# Patient Record
Sex: Female | Born: 1950 | Race: White | Hispanic: No | Marital: Married | Smoking: Current every day smoker
Health system: Southern US, Community
[De-identification: ages and names within clinical notes are randomized; demographics above are authoritative.]

## PROBLEM LIST (undated history)

## (undated) DIAGNOSIS — E785 Hyperlipidemia, unspecified: Secondary | ICD-10-CM

## (undated) DIAGNOSIS — J449 Chronic obstructive pulmonary disease, unspecified: Secondary | ICD-10-CM

## (undated) DIAGNOSIS — K219 Gastro-esophageal reflux disease without esophagitis: Secondary | ICD-10-CM

## (undated) DIAGNOSIS — R319 Hematuria, unspecified: Secondary | ICD-10-CM

## (undated) DIAGNOSIS — I10 Essential (primary) hypertension: Secondary | ICD-10-CM

## (undated) DIAGNOSIS — E559 Vitamin D deficiency, unspecified: Secondary | ICD-10-CM

## (undated) DIAGNOSIS — K8020 Calculus of gallbladder without cholecystitis without obstruction: Secondary | ICD-10-CM

## (undated) DIAGNOSIS — J329 Chronic sinusitis, unspecified: Secondary | ICD-10-CM

## (undated) DIAGNOSIS — F419 Anxiety disorder, unspecified: Secondary | ICD-10-CM

## (undated) DIAGNOSIS — IMO0001 Reserved for inherently not codable concepts without codable children: Secondary | ICD-10-CM

## (undated) DIAGNOSIS — N941 Unspecified dyspareunia: Secondary | ICD-10-CM

## (undated) HISTORY — DX: Hematuria, unspecified: R31.9

## (undated) HISTORY — DX: Anxiety disorder, unspecified: F41.9

## (undated) HISTORY — DX: Calculus of gallbladder without cholecystitis without obstruction: K80.20

## (undated) HISTORY — DX: Unspecified dyspareunia: N94.10

## (undated) HISTORY — DX: Chronic sinusitis, unspecified: J32.9

## (undated) HISTORY — DX: Hyperlipidemia, unspecified: E78.5

## (undated) HISTORY — DX: Reserved for inherently not codable concepts without codable children: IMO0001

## (undated) HISTORY — DX: Vitamin D deficiency, unspecified: E55.9

## (undated) HISTORY — DX: Gastro-esophageal reflux disease without esophagitis: K21.9

## (undated) HISTORY — DX: Essential (primary) hypertension: I10

---

## 1987-01-31 HISTORY — PX: OOPHORECTOMY: SHX86

## 1987-01-31 HISTORY — PX: APPENDECTOMY: SHX54

## 2005-04-27 ENCOUNTER — Ambulatory Visit: Payer: Self-pay

## 2007-07-31 ENCOUNTER — Ambulatory Visit: Payer: Self-pay | Admitting: Internal Medicine

## 2008-10-16 ENCOUNTER — Ambulatory Visit: Payer: Self-pay | Admitting: Internal Medicine

## 2008-12-23 ENCOUNTER — Ambulatory Visit: Payer: Self-pay | Admitting: Family Medicine

## 2009-10-06 ENCOUNTER — Ambulatory Visit: Payer: Self-pay | Admitting: Internal Medicine

## 2009-10-19 ENCOUNTER — Ambulatory Visit: Payer: Self-pay | Admitting: Internal Medicine

## 2010-11-08 ENCOUNTER — Ambulatory Visit: Payer: Self-pay

## 2010-11-28 ENCOUNTER — Ambulatory Visit: Payer: Self-pay | Admitting: Urology

## 2010-12-26 ENCOUNTER — Ambulatory Visit: Payer: Self-pay | Admitting: Gastroenterology

## 2010-12-27 LAB — PATHOLOGY REPORT

## 2012-04-11 ENCOUNTER — Ambulatory Visit: Payer: Self-pay

## 2013-12-23 ENCOUNTER — Ambulatory Visit: Payer: Self-pay

## 2014-01-30 HISTORY — PX: COLONOSCOPY: SHX174

## 2014-04-03 ENCOUNTER — Ambulatory Visit: Payer: Self-pay | Admitting: General Practice

## 2014-04-14 ENCOUNTER — Ambulatory Visit: Payer: Self-pay | Admitting: General Practice

## 2014-07-28 ENCOUNTER — Ambulatory Visit (INDEPENDENT_AMBULATORY_CARE_PROVIDER_SITE_OTHER): Payer: PRIVATE HEALTH INSURANCE | Admitting: Urology

## 2014-07-28 VITALS — BP 133/80 | HR 90 | Resp 18 | Ht 65.0 in | Wt 122.0 lb

## 2014-07-28 DIAGNOSIS — R312 Other microscopic hematuria: Secondary | ICD-10-CM

## 2014-07-28 DIAGNOSIS — R3129 Other microscopic hematuria: Secondary | ICD-10-CM | POA: Insufficient documentation

## 2014-07-28 NOTE — Progress Notes (Signed)
07/28/2014 3:35 PM   Shari Gutierrez [birth year] [id number]  Referring provider: Lavera Guise, MD [address] [place], [place] [address]  Chief Complaint  Patient presents with  . Hematuria    HPI:  1 - Microscopic Hematuria - Pt with blood on UA x several noted by PCP. No gross episodes. 30PY smoker, still smokes. Had cysto years ago for unrelated reasons that was normal per report.   PMH sig for appy, benign ovarian cyst removal. NO CV disease. No blood thinners.  Today "Shari Gutierrez" is seen as new patient for above. UA today confirms persistent microhematuria.    PMH: Past Medical History  Diagnosis Date  . Reflux   . Anxiety   . Hyperlipidemia   . Hypertension   . Sinusitis     Surgical History: Past Surgical History  Procedure Laterality Date  . Oophorectomy  1989  . Appendectomy  1989    Home Medications:    Medication List       This list is accurate as of: 07/28/14  3:35 PM.  Always use your most recent med list.               ALPRAZolam 0.25 MG tablet  Commonly known as:  XANAX  Take 0.25 mg by mouth at bedtime as needed for anxiety.     cholecalciferol 1000 UNITS tablet  Commonly known as:  VITAMIN D  Take 1,000 Units by mouth 2 (two) times daily.     fenofibrate 160 MG tablet  Take 160 mg by mouth daily.     fluconazole 150 MG tablet  Commonly known as:  DIFLUCAN  TAKE 1 TABLET BY MOUTH EVERY DAY MAY REPEAT IN 3 DAYS     metoprolol tartrate 25 MG tablet  Commonly known as:  LOPRESSOR  Take 25 mg by mouth 2 (two) times daily.     omeprazole 20 MG capsule  Commonly known as:  PRILOSEC  TAKE 1 CAPSULE ONCE DAILY FOR REFLUX     ranitidine 150 MG tablet  Commonly known as:  ZANTAC  Take 150 mg by mouth as needed for heartburn.        Allergies: No Known Allergies  Family History: Family History  Problem Relation Age of Onset  . Kidney failure Mother   . Cancer Father     Social History:  reports that she has been smoking  Cigarettes.  She started smoking about 35 years ago. She has been smoking about 1.00 pack per day. She does not have any smokeless tobacco history on file. She reports that she does not drink alcohol. Her drug history is not on file.    Review of Systems  Gastrointestinal (upper)  : Negative for upper GI symptoms  Gastrointestinal (lower) : Negative for lower GI symptoms  Constitutional : Negative for symptoms  Skin: Negative for skin symptoms  Eyes: Negative for eye symptoms  Ear/Nose/Throat : Negative for Ear/Nose/Throat symptoms  Hematologic/Lymphatic: Negative for Hematologic/Lymphatic symptoms  Cardiovascular : Negative for cardiovascular symptoms  Respiratory : Negative for respiratory symptoms  Endocrine: Negative for endocrine symptoms  Musculoskeletal: Negative for musculoskeletal symptoms  Neurological: Negative for neurological symptoms  Psychologic: Negative for psychiatric symptoms   Physical Exam: BP 133/80 mmHg  Pulse 90  Resp 18  Ht 5\' 5"  (1.651 m)  Wt 122 lb (55.339 kg)  BMI 20.30 kg/m2  Constitutional:  Alert and oriented, No acute distress. HEENT: [place] AT, moist mucus membranes.  Trachea midline, no masses. Cardiovascular: No clubbing, cyanosis,  or edema. Respiratory: Normal respiratory effort, no increased work of breathing. GI: Abdomen is soft, nontender, nondistended, no abdominal masses GU: No CVA tenderness. Remote lower rt abdominal scar w/o hernias.  Skin: No rashes, bruises or suspicious lesions. Lymph: No cervical or inguinal adenopathy. Neurologic: Grossly intact, no focal deficits, moving all 4 extremities. Psychiatric: Normal mood and affect.  Laboratory Data: No results found for: WBC, HGB, HCT, MCV, PLT  No results found for: CREATININE  No results found for: PSA  No results found for: TESTOSTERONE  No results found for: HGBA1C  Urinalysis No results found for: COLORURINE, APPEARANCEUR, LABSPEC, PHURINE,  GLUCOSEU, HGBUR, BILIRUBINUR, KETONESUR, PROTEINUR, UROBILINOGEN, NITRITE, LEUKOCYTESUR   Assessment & Plan:   1. Microscopic Hematuria - certainly warrants complete eval in long-term smoker with minimal comorbidity. BMP today. CT and cysto on return. Goals of evaluation discussed in detail.   2 - RTC 2-3 weeks for cysto    Alexis Frock, [place] [address] [place], [place] [address] [phone]

## 2014-07-29 LAB — MICROSCOPIC EXAMINATION: Bacteria, UA: NONE SEEN

## 2014-07-29 LAB — URINALYSIS, COMPLETE
BILIRUBIN UA: NEGATIVE
Glucose, UA: NEGATIVE
Ketones, UA: NEGATIVE
Leukocytes, UA: NEGATIVE
Nitrite, UA: NEGATIVE
PH UA: 5.5 (ref 5.0–7.5)
Protein, UA: NEGATIVE
Urobilinogen, Ur: 0.2 mg/dL (ref 0.2–1.0)

## 2014-07-29 LAB — BASIC METABOLIC PANEL
BUN/Creatinine Ratio: 13 (ref 11–26)
BUN: 12 mg/dL (ref 8–27)
CALCIUM: 10 mg/dL (ref 8.7–10.3)
CHLORIDE: 101 mmol/L (ref 97–108)
CO2: 23 mmol/L (ref 18–29)
Creatinine, Ser: 0.92 mg/dL (ref 0.57–1.00)
GFR calc Af Amer: 77 mL/min/{1.73_m2} (ref >59)
GFR calc non Af Amer: 66 mL/min/{1.73_m2} (ref >59)
Glucose: 94 mg/dL (ref 65–99)
Potassium: 4.2 mmol/L (ref 3.5–5.2)
Sodium: 138 mmol/L (ref 134–144)

## 2014-08-07 ENCOUNTER — Ambulatory Visit
Admission: RE | Admit: 2014-08-07 | Discharge: 2014-08-07 | Disposition: A | Discharge status: Home / Self Care | Payer: PRIVATE HEALTH INSURANCE | Source: Ambulatory Visit | Attending: Urology | Admitting: Urology

## 2014-08-07 DIAGNOSIS — I700 Atherosclerosis of aorta: Secondary | ICD-10-CM | POA: Insufficient documentation

## 2014-08-07 DIAGNOSIS — I77811 Abdominal aortic ectasia: Secondary | ICD-10-CM | POA: Diagnosis not present

## 2014-08-07 DIAGNOSIS — R312 Other microscopic hematuria: Secondary | ICD-10-CM | POA: Insufficient documentation

## 2014-08-07 DIAGNOSIS — Z87440 Personal history of urinary (tract) infections: Secondary | ICD-10-CM | POA: Diagnosis not present

## 2014-08-07 DIAGNOSIS — K8020 Calculus of gallbladder without cholecystitis without obstruction: Secondary | ICD-10-CM | POA: Insufficient documentation

## 2014-08-07 DIAGNOSIS — N8331 Acquired atrophy of ovary: Secondary | ICD-10-CM | POA: Diagnosis not present

## 2014-08-07 DIAGNOSIS — N289 Disorder of kidney and ureter, unspecified: Secondary | ICD-10-CM | POA: Insufficient documentation

## 2014-08-07 DIAGNOSIS — N858 Other specified noninflammatory disorders of uterus: Secondary | ICD-10-CM | POA: Insufficient documentation

## 2014-08-07 DIAGNOSIS — F17200 Nicotine dependence, unspecified, uncomplicated: Secondary | ICD-10-CM | POA: Diagnosis not present

## 2014-08-07 DIAGNOSIS — R3129 Other microscopic hematuria: Secondary | ICD-10-CM

## 2014-08-07 MED ORDER — IOHEXOL 300 MG/ML  SOLN
125.0000 mL | Freq: Once | INTRAMUSCULAR | Status: AC | PRN
  Administered 2014-08-07: 125 mL via INTRAVENOUS

## 2014-08-17 ENCOUNTER — Ambulatory Visit (INDEPENDENT_AMBULATORY_CARE_PROVIDER_SITE_OTHER): Payer: PRIVATE HEALTH INSURANCE | Admitting: Urology

## 2014-08-17 ENCOUNTER — Encounter: Payer: Self-pay | Admitting: Urology

## 2014-08-17 VITALS — BP 158/71 | HR 92 | Ht 65.0 in | Wt 121.1 lb

## 2014-08-17 DIAGNOSIS — R312 Other microscopic hematuria: Secondary | ICD-10-CM

## 2014-08-17 DIAGNOSIS — N952 Postmenopausal atrophic vaginitis: Secondary | ICD-10-CM

## 2014-08-17 DIAGNOSIS — R3129 Other microscopic hematuria: Secondary | ICD-10-CM

## 2014-08-17 LAB — URINALYSIS, COMPLETE
BILIRUBIN UA: NEGATIVE
Glucose, UA: NEGATIVE
KETONES UA: NEGATIVE
Nitrite, UA: NEGATIVE
PH UA: 7 (ref 5.0–7.5)
Protein, UA: NEGATIVE
SPEC GRAV UA: 1.015 (ref 1.005–1.030)
Urobilinogen, Ur: 0.2 mg/dL (ref 0.2–1.0)

## 2014-08-17 LAB — MICROSCOPIC EXAMINATION: BACTERIA UA: NONE SEEN

## 2014-08-17 MED ORDER — ESTROGENS, CONJUGATED 0.625 MG/GM VA CREA: 1.0000 g | TOPICAL_CREAM | VAGINAL | Status: DC

## 2014-08-17 MED ORDER — LIDOCAINE HCL 2 % EX GEL
1.0000 "application " | Freq: Once | CUTANEOUS | Status: AC
  Administered 2014-08-17: 1 via URETHRAL

## 2014-08-17 MED ORDER — CIPROFLOXACIN HCL 500 MG PO TABS
500.0000 mg | ORAL_TABLET | Freq: Once | ORAL | Status: AC
  Administered 2014-08-17: 500 mg via ORAL

## 2014-08-17 NOTE — Progress Notes (Signed)
3:43 PM   Shari Gutierrez 03/18/1950 [id number]  Referring provider: Lavera Guise, MD [address] [place], [place] [address]  Chief Complaint  Patient presents with  . Cysto    CTscan results    HPI: 64 yo F with history of microscopic hematuria who presents today for cystoscopy to complete work up.  CT Urogram negative for any GU pathology.  Pt with blood on UA x several noted by PCP. No gross episodes. 30PY smoker, still smokes. Had cysto years ago for unrelated reasons that was normal per report.  PMH sig for appy, benign ovarian cyst removal. NO CV disease. No blood thinners.  She does complain of significant vaginal dryness.   PMH: Past Medical History  Diagnosis Date  . Reflux   . Anxiety   . Hyperlipidemia   . Hypertension   . Sinusitis     Surgical History: Past Surgical History  Procedure Laterality Date  . Oophorectomy  1989  . Appendectomy  1989    Home Medications:    Medication List       This list is accurate as of: 08/17/14  3:43 PM.  Always use your most recent med list.               ALPRAZolam 0.25 MG tablet  Commonly known as:  XANAX  Take 0.25 mg by mouth at bedtime as needed for anxiety.     cholecalciferol 1000 UNITS tablet  Commonly known as:  VITAMIN D  Take 1,000 Units by mouth 2 (two) times daily.     fenofibrate 160 MG tablet  Take 160 mg by mouth daily.     metoprolol tartrate 25 MG tablet  Commonly known as:  LOPRESSOR  Take 25 mg by mouth 2 (two) times daily.     omeprazole 20 MG capsule  Commonly known as:  PRILOSEC  TAKE 1 CAPSULE ONCE DAILY FOR REFLUX        Allergies: No Known Allergies  Family History: Family History  Problem Relation Age of Onset  . Kidney failure Mother   . Cancer Father     Social History:  reports that she has been smoking Cigarettes.  She started smoking about 35 years ago. She has been smoking about 1.00 pack per day. She does not have any smokeless tobacco history on  file. She reports that she does not drink alcohol. Her drug history is not on file.   Physical Exam: BP 158/71 mmHg  Pulse 92  Ht 5\' 5"  (1.651 m)  Wt 121 lb 1.6 oz (54.931 kg)  BMI 20.15 kg/m2  Constitutional:  Alert and oriented, No acute distress. HEENT: [place] AT, moist mucus membranes.  Trachea midline, no masses. Cardiovascular: No clubbing, cyanosis, or edema. Respiratory: Normal respiratory effort, no increased work of breathing. GI: Abdomen is soft, nontender, nondistended, no abdominal masses GU: No CVA tenderness. Atrophic vaginitis noted. Normal external genitalia otherwise. Normal urethral meatus. Skin: No rashes, bruises or suspicious lesions. Neurologic: Grossly intact, no focal deficits, moving all 4 extremities. Psychiatric: Normal mood and affect.  Laboratory Data:  Lab Results  Component Value Date   CREATININE 0.92 07/28/2014    Urinalysis Results for orders placed or performed in visit on 08/17/14  Microscopic Examination  Result Value Ref Range   WBC, UA 0-5 0 -  5 /hpf   RBC, UA 3-10 (A) 0 -  2 /hpf   Epithelial Cells (non renal) 0-10 0 - 10 /hpf   Bacteria, UA None  seen None seen/Few  Urinalysis, Complete  Result Value Ref Range   Specific Gravity, UA 1.015 1.005 - 1.030   pH, UA 7.0 5.0 - 7.5   Color, UA Yellow Yellow   Appearance Ur Clear Clear   Leukocytes, UA 1+ (A) Negative   Protein, UA Negative Negative/Trace   Glucose, UA Negative Negative   Ketones, UA Negative Negative   RBC, UA Trace (A) Negative   Bilirubin, UA Negative Negative   Urobilinogen, Ur 0.2 0.2 - 1.0 mg/dL   Nitrite, UA Negative Negative   Microscopic Examination See below:     Imaging: CLINICAL DATA: 64 year old female with history of urinary tract infection 1 month ago. Recent urinalysis demonstrated microscopic hematuria. Currently asymptomatic.  EXAM: CT ABDOMEN AND PELVIS WITHOUT AND WITH CONTRAST  TECHNIQUE: Multidetector CT imaging of the abdomen and  pelvis was performed following the standard protocol before and following the bolus administration of intravenous contrast.  CONTRAST: 110mL OMNIPAQUE IOHEXOL 300 MG/ML SOLN  COMPARISON: No priors.  FINDINGS: Lower chest: Unremarkable.  Hepatobiliary: No cystic or solid hepatic lesions. No intra or extrahepatic biliary ductal dilatation. 7 mm calcified gallstone lying dependently in the gallbladder. No findings of an acute cholecystitis at this time.  Pancreas: No cystic or solid pancreatic lesions. No pancreatic ductal dilatation. No pancreatic or peripancreatic fluid or inflammatory changes.  Spleen: Unremarkable.  Adrenals/Urinary Tract: No calcifications are identified within the collecting system of either kidney, along the course of either ureter, or within the lumen of the urinary bladder. No hydroureteronephrosis or perinephric stranding to indicate urinary tract obstruction at this time. 2 sub cm low-attenuation lesions are noted in the right kidney, too small to characterize, but favored to represent tiny cysts. No aggressive appearing renal lesions are otherwise noted. Postcontrast delayed images demonstrate no definite filling defect within the collecting system of either kidney, along the well opacified portions of either ureter (portions of the mid to distal third of the right ureter and distal third of the left ureter are incompletely opacified) to strongly suggest the presence of a urothelial neoplasm at this time. Urinary bladder is normal in appearance. Bilateral adrenal glands are normal in appearance.  Stomach/Bowel: Normal appearance of the stomach. No pathologic dilatation of small bowel or colon.  Vascular/Lymphatic: Extensive atherosclerosis throughout the abdominal and pelvic vasculature, with mild ectasia throughout the abdominal aorta, but no frank aneurysm, and node dissection. No lymphadenopathy noted in the abdomen or  pelvis.  Reproductive: Uterus and ovaries are atrophic.  Other: No significant volume of ascites. No pneumoperitoneum.  Musculoskeletal: There are no aggressive appearing lytic or blastic lesions noted in the visualized portions of the skeleton.  IMPRESSION: 1. No explanation for the patient's history of micro hematuria. 2. Two sub cm low-attenuation lesions in the right kidney are too small to definitively characterize. Statistically, these are favored to represent tiny cysts. 3. Cholelithiasis without evidence of acute cholecystitis at this time. 4. Atherosclerosis.   Electronically Signed  By: Vinnie Langton M.D.  On: 08/07/2014 11:28    Cystoscopy Procedure Note  Patient identification was confirmed, informed consent was obtained, and patient was prepped using Betadine solution.  Lidocaine jelly was administered per urethral meatus.    Preoperative abx where received prior to procedure.    Procedure: - Flexible cystoscope introduced, without any difficulty.   - Thorough search of the bladder revealed:    normal urethral meatus    normal urothelium    no stones    no ulcers  no tumors    no urethral polyps    no trabeculation  - Ureteral orifices were normal in position and appearance.  Post-Procedure: - Patient tolerated the procedure well   Assessment & Plan: 64 year old smoker with microscopic hematuria status post negative workup including CT urogram and cystoscopy.  1. Microscopic hematuria As above.  Given her continued smoking, would recommend follow-up in 2 years for reassessment. - Urinalysis, Complete - ciprofloxacin (CIPRO) tablet 500 mg; Take 1 tablet (500 mg total) by mouth once. - lidocaine (XYLOCAINE) 2 % jelly 1 application; Place 1 application into the urethra once.  2. Atrophic vaginitis Atrophic vaginitis noted today, she is fairly symptomatic from this. We discussed risks and benefits as well as injections for use.  No  personal history of breast cancer. - conjugated estrogens (PREMARIN) vaginal cream; Place 0.5 Applicatorfuls vaginally every Monday, Wednesday, and Friday. Use small pea sized amount per urethral/ vaginia three times at bedtime per week on M-W-F.  Dispense: 42.5 g; Refill: [place], MD  [facility] [address] [place], [place] [address] [phone]

## 2014-11-17 ENCOUNTER — Encounter: Payer: Self-pay | Admitting: Physician Assistant

## 2014-11-17 ENCOUNTER — Ambulatory Visit: Payer: Self-pay | Admitting: Physician Assistant

## 2014-11-17 VITALS — BP 164/68 | Temp 98.4°F

## 2014-11-17 DIAGNOSIS — R300 Dysuria: Secondary | ICD-10-CM

## 2014-11-17 LAB — POCT URINALYSIS DIPSTICK
BILIRUBIN UA: NEGATIVE
Glucose, UA: NEGATIVE
Ketones, UA: NEGATIVE
LEUKOCYTES UA: NEGATIVE
NITRITE UA: NEGATIVE
Protein, UA: NEGATIVE
Spec Grav, UA: 1.015
UROBILINOGEN UA: 0.2
pH, UA: 7

## 2014-11-17 MED ORDER — NITROFURANTOIN MONOHYD MACRO 100 MG PO CAPS: 100.0000 mg | ORAL_CAPSULE | Freq: Two times a day (BID) | ORAL | Status: DC

## 2014-11-17 NOTE — Progress Notes (Signed)
S:  C/o uti sx for 4 days, burning, urgency, frequency, denies vaginal discharge, abdominal pain or flank pain:  Remainder ros neg, started taking macrobid 2 days ago  O:  Vitals wnl, nad, no cva tenderness, back nontender, lungs c t a,cv rrr, abd soft nontender, bs normal, n/v intact, ua 2+ blood  A: uti  P: macrobid 100mg  bid x 7d, increase water intake, add cranberry juice, return if not improving in 2 -3 days, return earlier if worsening, discussed pyelonephritis sx

## 2014-12-02 ENCOUNTER — Other Ambulatory Visit: Payer: Self-pay | Admitting: Nurse Practitioner

## 2014-12-02 DIAGNOSIS — Z1231 Encounter for screening mammogram for malignant neoplasm of breast: Secondary | ICD-10-CM

## 2014-12-07 ENCOUNTER — Ambulatory Visit: Payer: Self-pay | Admitting: Physician Assistant

## 2014-12-07 ENCOUNTER — Encounter: Payer: Self-pay | Admitting: Physician Assistant

## 2014-12-07 VITALS — BP 110/70 | HR 83 | Temp 98.4°F

## 2014-12-07 DIAGNOSIS — R09A2 Foreign body sensation, throat: Secondary | ICD-10-CM

## 2014-12-07 DIAGNOSIS — R0989 Other specified symptoms and signs involving the circulatory and respiratory systems: Secondary | ICD-10-CM

## 2014-12-07 NOTE — Progress Notes (Signed)
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

## 2014-12-28 ENCOUNTER — Ambulatory Visit: Payer: PRIVATE HEALTH INSURANCE

## 2014-12-31 ENCOUNTER — Ambulatory Visit: Payer: Self-pay | Admitting: Physician Assistant

## 2014-12-31 ENCOUNTER — Encounter: Payer: Self-pay | Admitting: Physician Assistant

## 2014-12-31 VITALS — BP 115/65 | HR 98 | Temp 98.0°F

## 2014-12-31 DIAGNOSIS — J018 Other acute sinusitis: Secondary | ICD-10-CM

## 2014-12-31 MED ORDER — AZITHROMYCIN 250 MG PO TABS: ORAL_TABLET | ORAL | Status: DC

## 2014-12-31 MED ORDER — PREDNISONE 10 MG PO TABS: 30.0000 mg | ORAL_TABLET | Freq: Every day | ORAL | Status: DC

## 2014-12-31 NOTE — Progress Notes (Signed)
S: C/o sinus pain for about 4 days, some fever, chills last 2 days but better now, some cough, denies  cp/sob, v/d, c/o of facial and dental pain.   Using otc meds:   O: PE: vitals wnl, nad, perrl eomi, normocephalic, tms dull, nasal mucosa red and swollen, throat injected, neck supple no lymph, lungs c t a, cv rrr, neuro intact  A:  Acute sinusitis   P: zpack, prednisone 30mg  qd x 3d;  drink fluids, continue regular meds , use otc meds of choice, return if not improving in 5 days, return earlier if worsening

## 2015-01-05 ENCOUNTER — Ambulatory Visit
Admission: RE | Admit: 2015-01-05 | Discharge: 2015-01-05 | Disposition: A | Discharge status: Home / Self Care | Payer: PRIVATE HEALTH INSURANCE | Source: Ambulatory Visit | Attending: Nurse Practitioner | Admitting: Nurse Practitioner

## 2015-01-05 DIAGNOSIS — Z1231 Encounter for screening mammogram for malignant neoplasm of breast: Secondary | ICD-10-CM | POA: Insufficient documentation

## 2015-01-11 ENCOUNTER — Encounter: Payer: Self-pay | Admitting: Physician Assistant

## 2015-01-11 ENCOUNTER — Ambulatory Visit: Payer: Self-pay | Admitting: Physician Assistant

## 2015-01-11 VITALS — BP 101/60 | HR 86 | Temp 97.9°F

## 2015-01-11 DIAGNOSIS — J018 Other acute sinusitis: Secondary | ICD-10-CM

## 2015-01-11 DIAGNOSIS — J069 Acute upper respiratory infection, unspecified: Secondary | ICD-10-CM

## 2015-01-11 MED ORDER — AZITHROMYCIN 250 MG PO TABS: ORAL_TABLET | ORAL | Status: DC

## 2015-01-11 NOTE — Progress Notes (Signed)
S: C/o runny nose and congestion for 3 days, no fever, chills, cp/sob, v/d; mucus was green this am but clear throughout the day, cough is sporadic, husband has same, got better when had zpack but sx have returned since he's been sick   O: PE: perrl eomi, normocephalic, tms dull, nasal mucosa red and swollen, throat injected, neck supple no lymph, lungs c t a, cv rrr, neuro intact, cough is congested  A:  Acute uri   P: zpack, drink fluids, continue regular meds , use otc meds of choice, return if not improving in 5 days, return earlier if worsening

## 2015-04-12 ENCOUNTER — Other Ambulatory Visit: Payer: Self-pay | Admitting: Neurology

## 2015-04-12 DIAGNOSIS — R42 Dizziness and giddiness: Secondary | ICD-10-CM

## 2015-04-29 ENCOUNTER — Encounter: Payer: Self-pay | Admitting: Physician Assistant

## 2015-04-29 ENCOUNTER — Ambulatory Visit: Payer: Self-pay | Admitting: Physician Assistant

## 2015-04-29 VITALS — BP 143/79 | HR 98 | Temp 98.3°F

## 2015-04-29 DIAGNOSIS — Z79899 Other long term (current) drug therapy: Secondary | ICD-10-CM

## 2015-04-29 NOTE — Progress Notes (Signed)
S: has questions about medication, switched from dulera to ventolin, wants to make sure that's ok, also having mri, ?if should take xanax prior to mri  O: vitals wnl, nad, neuro intact  A: medication review  P: ok to continue ventolin instead of dulera, pt does not have hx of asthma or known copd; take xanax prior to [facility]

## 2015-04-30 ENCOUNTER — Ambulatory Visit
Admission: RE | Admit: 2015-04-30 | Discharge: 2015-04-30 | Disposition: A | Discharge status: Home / Self Care | Payer: Managed Care, Other (non HMO) | Source: Ambulatory Visit | Attending: Neurology | Admitting: Neurology

## 2015-04-30 DIAGNOSIS — R42 Dizziness and giddiness: Secondary | ICD-10-CM | POA: Diagnosis present

## 2015-04-30 DIAGNOSIS — H8110 Benign paroxysmal vertigo, unspecified ear: Secondary | ICD-10-CM | POA: Diagnosis present

## 2015-04-30 LAB — POCT I-STAT CREATININE: Creatinine, Ser: 1 mg/dL (ref 0.44–1.00)

## 2015-04-30 MED ORDER — GADOBENATE DIMEGLUMINE 529 MG/ML IV SOLN
15.0000 mL | Freq: Once | INTRAVENOUS | Status: AC | PRN
  Administered 2015-04-30: 11 mL via INTRAVENOUS

## 2015-05-10 ENCOUNTER — Encounter: Payer: Self-pay | Admitting: Physician Assistant

## 2015-05-10 ENCOUNTER — Ambulatory Visit: Payer: Self-pay | Admitting: Physician Assistant

## 2015-05-10 VITALS — BP 120/70 | HR 84 | Temp 97.7°F

## 2015-05-10 DIAGNOSIS — Z299 Encounter for prophylactic measures, unspecified: Secondary | ICD-10-CM

## 2015-05-10 NOTE — Progress Notes (Signed)
S: pt here because wants to know results of mri , hasn't heard from her doctor  O: vitals wnl, nad, neuro intact  A: lab review  P: mri reviewed, results normal

## 2015-06-15 ENCOUNTER — Ambulatory Visit: Payer: Self-pay | Admitting: Physician Assistant

## 2015-06-15 ENCOUNTER — Encounter: Payer: Self-pay | Admitting: Physician Assistant

## 2015-06-15 VITALS — BP 119/70 | HR 88 | Temp 97.8°F

## 2015-06-15 DIAGNOSIS — N390 Urinary tract infection, site not specified: Secondary | ICD-10-CM

## 2015-06-15 MED ORDER — NITROFURANTOIN MONOHYD MACRO 100 MG PO CAPS: 100.0000 mg | ORAL_CAPSULE | Freq: Two times a day (BID) | ORAL | Status: DC

## 2015-06-15 NOTE — Progress Notes (Signed)
S:  C/o uti sx for 2 days, bladder pressure, back pain,  urgency, frequency, denies vaginal discharge, abdominal pain or flank pain:  Remainder ros neg  O:  Vitals wnl, nad, no cva tenderness, back nontender, lungs c t a,cv rrr, abd soft nontender, bs normal, n/v intact, ua trace leuks  A: uti  P: macrobid 100mg  bid x 7d, increase water intake, add cranberry juice, return if not improving in 2 -3 days, return earlier if worsening, discussed pyelonephritis sx

## 2015-06-23 ENCOUNTER — Encounter: Payer: Self-pay | Admitting: Physician Assistant

## 2015-06-23 ENCOUNTER — Ambulatory Visit: Payer: Self-pay | Admitting: Physician Assistant

## 2015-06-23 VITALS — BP 110/70 | HR 92 | Temp 98.5°F

## 2015-06-23 DIAGNOSIS — N390 Urinary tract infection, site not specified: Secondary | ICD-10-CM

## 2015-06-23 LAB — POCT URINALYSIS DIPSTICK
BILIRUBIN UA: NEGATIVE
Glucose, UA: NEGATIVE
KETONES UA: NEGATIVE
Leukocytes, UA: NEGATIVE
Nitrite, UA: NEGATIVE
PROTEIN UA: NEGATIVE
SPEC GRAV UA: 1.01
Urobilinogen, UA: 0.2
pH, UA: 5.5

## 2015-06-23 MED ORDER — NITROFURANTOIN MONOHYD MACRO 100 MG PO CAPS: 100.0000 mg | ORAL_CAPSULE | Freq: Two times a day (BID) | ORAL | Status: DC

## 2015-06-23 NOTE — Addendum Note (Signed)
Addended by: Rudene Anda T on: 06/23/2015 09:49 AM   Modules accepted: Orders

## 2015-06-23 NOTE — Progress Notes (Signed)
S:  C/o uti sx for 8-9 days, burning, urgency, frequency, denies vaginal discharge, abdominal pain or flank pain:  Remainder ros neg, finished antibiotic but stills feel pressure like the infection isn't completely gone, is going out of town and worried it will get worse  O:  Vitals wnl, nad, neuro intact, ua trace blood  A: dysuria  P: rx for macrobid sent to pharmacy, will culture urine, pt will not use antibiotic unless culture is + or sx worsen;  increase water intake, add cranberry juice, return if not improving in 2 -3 days, return earlier if worsening, discussed pyelonephritis sx

## 2015-06-25 LAB — URINE CULTURE: Organism ID, Bacteria: NO GROWTH

## 2015-06-30 ENCOUNTER — Encounter: Payer: Self-pay | Admitting: Obstetrics and Gynecology

## 2015-07-08 ENCOUNTER — Encounter: Payer: Self-pay | Admitting: Obstetrics and Gynecology

## 2015-07-08 ENCOUNTER — Ambulatory Visit (INDEPENDENT_AMBULATORY_CARE_PROVIDER_SITE_OTHER): Payer: Managed Care, Other (non HMO) | Admitting: Obstetrics and Gynecology

## 2015-07-08 VITALS — BP 115/64 | HR 86 | Ht 65.0 in | Wt 125.6 lb

## 2015-07-08 DIAGNOSIS — Z0000 Encounter for general adult medical examination without abnormal findings: Secondary | ICD-10-CM

## 2015-07-08 DIAGNOSIS — Z780 Asymptomatic menopausal state: Secondary | ICD-10-CM | POA: Diagnosis not present

## 2015-07-08 DIAGNOSIS — Z1211 Encounter for screening for malignant neoplasm of colon: Secondary | ICD-10-CM | POA: Diagnosis not present

## 2015-07-08 DIAGNOSIS — N952 Postmenopausal atrophic vaginitis: Secondary | ICD-10-CM

## 2015-07-08 DIAGNOSIS — Z01419 Encounter for gynecological examination (general) (routine) without abnormal findings: Secondary | ICD-10-CM

## 2015-07-08 DIAGNOSIS — N941 Unspecified dyspareunia: Secondary | ICD-10-CM

## 2015-07-08 NOTE — Patient Instructions (Signed)
1. Pap smear is done 2. Mammogram already done this year 3. Stool guaiac cards are given for colon cancer screening 4. Continue with healthy eating and exercise 5. Recommend calcium with vitamin D supplementation-1200 mg a day 6. Start trial of Estrace cream 1/2 g intravaginal twice a week 7. Return in 1 year

## 2015-07-08 NOTE — Progress Notes (Signed)
ANNUAL PREVENTATIVE CARE GYN  ENCOUNTER NOTE  Subjective:       Shari Gutierrez is a 65 y.o. G11P1001 female here for a routine annual gynecologic exam.  Current complaints: 1.  Dyspareunia  In May 2016, patient underwent ENDOSEE procedure, and endometrial biopsy because of an abnormal endometrial fluid collection with was identified on ultrasound on 05/14/2014.  The ENDOSEE procedure demonstrated atrophic appearing endometrium.  Endometrial biopsy was benign.  Patient has not experienced any abnormal uterine bleeding.  Main concern today is pelvic discomfort with intercourse.  She does use lubricants, but has never used vaginal estrogen cream   Gynecologic History No LMP recorded. Patient is postmenopausal. Contraception: post menopausal statusMenstrual Last mammogram: 12/2014. Results were: normal  Obstetric History OB History  Gravida Para Term Preterm AB SAB TAB Ectopic Multiple Living  1 1 1       1     # Outcome Date GA Lbr Len/2nd Weight Sex Delivery Anes PTL Lv  1 Term 1978   6 lb (2.722 kg) F Vag-Spont   Y      Past Medical History  Diagnosis Date  . Reflux   . Anxiety   . Hyperlipidemia   . Hypertension   . Sinusitis   . 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 Vitamin D deficiency   . Hematuria   . Gall stones   . Kidney stones     Past Surgical History  Procedure Laterality Date  . Oophorectomy  1989  . Appendectomy  1989    Current Outpatient Prescriptions on File Prior to Visit  Medication Sig Dispense Refill  . cholecalciferol (VITAMIN D) 1000 UNITS tablet Take 1,000 Units by mouth 2 (two) times daily.    . fenofibrate 160 MG tablet Take 160 mg by mouth daily.    . metoprolol tartrate (LOPRESSOR) 25 MG tablet Take 25 mg by mouth 2 (two) times daily.     No current facility-administered medications on file prior to visit.    No Known Allergies  Social History   Social History  . Marital Status: Married    Spouse Name: N/A  . Number of Children: N/A  . Years  of Education: N/A   Occupational History  . Not on file.   Social History Main Topics  . Smoking status: Current Every Day Smoker -- 1.00 packs/day    Types: Cigarettes    Start date: 07/28/1979  . Smokeless tobacco: Not on file  . Alcohol Use: No  . Drug Use: No  . Sexual Activity: Yes    Birth Control/ Protection: Post-menopausal   Other Topics Concern  . Not on file   Social History Narrative    Family History  Problem Relation Age of Onset  . Kidney failure Mother   . Cancer Father   . Breast cancer Neg Hx   . Ovarian cancer Neg Hx   . Colon cancer Neg Hx   . Heart disease Neg Hx   . Diabetes Neg Hx     The following portions of the patient's history were reviewed and updated as appropriate: allergies, current medications, past family history, past medical history, past social history, past surgical history and problem list.  Review of Systems ROS Review of Systems - General ROS: negative for - chills, fatigue, fever, hot flashes, night sweats, weight gain or weight loss Psychological ROS: negative for - anxiety, decreased libido, depression, mood swings, physical abuse or sexual abuse Ophthalmic ROS: negative for - blurry vision, eye pain or loss of vision  ENT ROS: negative for - headaches, hearing change, visual changes or vocal changes Allergy and Immunology ROS: negative for - hives, itchy/watery eyes or seasonal allergies Hematological and Lymphatic ROS: negative for - bleeding problems, bruising, swollen lymph nodes or weight loss Endocrine ROS: negative for - galactorrhea, hair pattern changes, hot flashes, malaise/lethargy, mood swings, palpitations, polydipsia/polyuria, skin changes, temperature intolerance or unexpected weight changes Breast ROS: negative for - new or changing breast lumps or nipple discharge Respiratory ROS: negative for - cough or shortness of breath Cardiovascular ROS: negative for - chest pain, irregular heartbeat, palpitations or  shortness of breath Gastrointestinal ROS: no abdominal pain, change in bowel habits, or black or bloody stools Genito-Urinary ROS: no dysuria, trouble voiding, or hematuria.  POSITIVE-dyspareunia Musculoskeletal ROS: negative for - joint pain or joint stiffness Neurological ROS: negative for - bowel and bladder control changes Dermatological ROS: negative for rash and skin lesion changes   Objective:   BP 115/64 mmHg  Pulse 86  Ht 5\' 5"  (1.651 m)  Wt 125 lb 9.6 oz (56.972 kg)  BMI 20.90 kg/m2 CONSTITUTIONAL: Well-developed, well-nourished female in no acute distress.  PSYCHIATRIC: Normal mood and affect. Normal behavior. Normal judgment and thought content. [place]: Alert and oriented to person, place, and time. Normal muscle tone coordination. No cranial nerve deficit noted. HENT:  Normocephalic, atraumatic EYES: Conjunctivae and EOM are normal. No scleral icterus.  NECK: Normal range of motion, supple, no masses.  Normal thyroid.  SKIN: Skin is warm and dry. No rash noted. Not diaphoretic. No erythema. No pallor. CARDIOVASCULAR: Normal heart rate noted, regular rhythm, no murmur. RESPIRATORY: Clear to auscultation bilaterally. Effort and breath sounds normal, no problems with respiration noted. BREASTS: Symmetric in size. No masses, skin changes, nipple drainage, or lymphadenopathy. ABDOMEN: Soft, normal bowel sounds, no distention noted.  No tenderness, rebound or guarding.  BLADDER: Normal PELVIC:  External Genitalia: Normal  BUS: Normal  Vagina: severe atrophy  Cervix: Normal; no lesions; no cervical motion tenderness   Uterus: Normal; midplane, normal size, shape, mobile  Adnexa: Normal  RV: External Exam NormaI, No Rectal Masses and Normal Sphincter tone  MUSCULOSKELETAL: Normal range of motion. No tenderness.  No cyanosis, clubbing, or edema.  2+ distal pulses. LYMPHATIC: No Axillary, Supraclavicular, or Inguinal Adenopathy.    Assessment:   Annual gynecologic  examination 65 y.o. Contraception: post menopausal status Normal BMI Problem List Items Addressed This Visit    None    Visit Diagnoses    Screen for colon cancer    -  Primary    Relevant Orders    Fecal occult blood, imunochemical     vaginal atrophy, severe, symptomatic  Plan:  Pap: Pap Co Test Mammogram: Ordered Stool Guaiac Testing:  Ordered Labs: primary care Routine preventative health maintenance measures emphasized: Exercise/Diet/Weight control, Tobacco Warnings and Alcohol/Substance use risks Trial of Estrace cream 1/2 g intravaginal twice weekly Return to Clinic - [place], MD   Note: This dictation was prepared with Dragon dictation along with smaller phrase technology. Any transcriptional errors that result from this process are unintentional.

## 2015-07-12 LAB — PAP IG AND HPV HIGH-RISK
HPV, high-risk: NEGATIVE
PAP Smear Comment: 0

## 2015-07-21 ENCOUNTER — Other Ambulatory Visit: Payer: Self-pay

## 2015-07-21 MED ORDER — ESTRADIOL 0.1 MG/GM VA CREA
0.5000 | TOPICAL_CREAM | VAGINAL | Status: DC
Start: 2015-07-21 — End: 2016-07-24

## 2015-08-04 ENCOUNTER — Ambulatory Visit: Payer: Self-pay | Admitting: Physician Assistant

## 2015-08-04 ENCOUNTER — Encounter: Payer: Self-pay | Admitting: Physician Assistant

## 2015-08-04 VITALS — BP 110/70 | HR 79 | Temp 97.6°F

## 2015-08-04 DIAGNOSIS — J069 Acute upper respiratory infection, unspecified: Secondary | ICD-10-CM

## 2015-08-04 MED ORDER — LEVOFLOXACIN 500 MG PO TABS: 500.0000 mg | ORAL_TABLET | Freq: Every day | ORAL | Status: DC

## 2015-08-04 MED ORDER — FIRST-DUKES MOUTHWASH MT SUSP: 5.0000 mL | Freq: Three times a day (TID) | OROMUCOSAL | Status: DC

## 2015-08-04 NOTE — Progress Notes (Signed)
S: C/o runny nose and congestion for 3 days, sinus pressure, deep cough, chest congestion, no fever, chills, cp/sob, v/d; mucus was green this am but clear throughout the day, cough is sporadic,   Using otc meds: robitussin  O: PE: vitals wnl, nad, perrl eomi, normocephalic, tms dull, nasal mucosa red and swollen, throat injected, neck supple no lymph, lungs c t a, cv rrr, neuro intact, cough is wet and deep  A:  Acute  uri   P: levaquin 500mg  qd, dukes for yeast in mouth if needed; take otc probiotic, drink fluids, continue regular meds , use otc meds of choice, return if not improving in 5 days, return earlier if worsening

## 2015-10-06 DIAGNOSIS — J019 Acute sinusitis, unspecified: Secondary | ICD-10-CM | POA: Diagnosis not present

## 2015-10-06 DIAGNOSIS — J301 Allergic rhinitis due to pollen: Secondary | ICD-10-CM | POA: Diagnosis not present

## 2015-10-12 DIAGNOSIS — H40153 Residual stage of open-angle glaucoma, bilateral: Secondary | ICD-10-CM | POA: Diagnosis not present

## 2015-11-09 DIAGNOSIS — R42 Dizziness and giddiness: Secondary | ICD-10-CM | POA: Diagnosis not present

## 2015-11-09 DIAGNOSIS — H8111 Benign paroxysmal vertigo, right ear: Secondary | ICD-10-CM | POA: Diagnosis not present

## 2015-12-08 ENCOUNTER — Other Ambulatory Visit: Payer: Self-pay | Admitting: Nurse Practitioner

## 2015-12-08 DIAGNOSIS — J449 Chronic obstructive pulmonary disease, unspecified: Secondary | ICD-10-CM | POA: Diagnosis not present

## 2015-12-08 DIAGNOSIS — Z1231 Encounter for screening mammogram for malignant neoplasm of breast: Secondary | ICD-10-CM

## 2015-12-08 DIAGNOSIS — E782 Mixed hyperlipidemia: Secondary | ICD-10-CM | POA: Diagnosis not present

## 2015-12-08 DIAGNOSIS — Z0001 Encounter for general adult medical examination with abnormal findings: Secondary | ICD-10-CM | POA: Diagnosis not present

## 2015-12-08 DIAGNOSIS — F17210 Nicotine dependence, cigarettes, uncomplicated: Secondary | ICD-10-CM | POA: Diagnosis not present

## 2015-12-08 DIAGNOSIS — N390 Urinary tract infection, site not specified: Secondary | ICD-10-CM | POA: Diagnosis not present

## 2015-12-08 DIAGNOSIS — I10 Essential (primary) hypertension: Secondary | ICD-10-CM | POA: Diagnosis not present

## 2016-01-13 ENCOUNTER — Ambulatory Visit: Payer: Managed Care, Other (non HMO)

## 2016-02-04 DIAGNOSIS — H40153 Residual stage of open-angle glaucoma, bilateral: Secondary | ICD-10-CM | POA: Diagnosis not present

## 2016-02-16 ENCOUNTER — Ambulatory Visit: Payer: Managed Care, Other (non HMO)

## 2016-03-15 ENCOUNTER — Ambulatory Visit
Admission: RE | Admit: 2016-03-15 | Discharge: 2016-03-15 | Disposition: A | Discharge status: Home / Self Care | Payer: PPO | Source: Ambulatory Visit | Attending: Nurse Practitioner | Admitting: Nurse Practitioner

## 2016-03-15 DIAGNOSIS — Z1231 Encounter for screening mammogram for malignant neoplasm of breast: Secondary | ICD-10-CM | POA: Diagnosis not present

## 2016-06-07 DIAGNOSIS — H40153 Residual stage of open-angle glaucoma, bilateral: Secondary | ICD-10-CM | POA: Diagnosis not present

## 2016-07-11 ENCOUNTER — Encounter: Payer: Managed Care, Other (non HMO) | Admitting: Obstetrics and Gynecology

## 2016-07-18 ENCOUNTER — Encounter: Payer: Managed Care, Other (non HMO) | Admitting: Obstetrics and Gynecology

## 2016-07-24 NOTE — Progress Notes (Signed)
ANNUAL PREVENTATIVE CARE GYN  ENCOUNTER NOTE  Subjective:       Shari Gutierrez is a 66 y.o. G72P1001 female with 30+ pack year history is here for a routine annual gynecologic exam.  Current complaints: 1.  Dyspareunia; symptoms are much better with premarin  Patient states she is doing well, and having no new issues besides the occasional dryness and irritation. States she is only using  Estrace cream once a week, twice if she can remember. Vitamin D supplementation is also done "when she remembers". She is still smoking and doesn't intend on quitting. She has declined colonoscopy and low dose CT scan with her PCP for cancer screening. Her husband has recently retired so they are spending half their time at [employer] and playing with the grand kids.    Gynecologic History No LMP recorded. Patient is postmenopausal. Contraception: post menopausal status Pap smear-07/2015 neg/neg Last mammogram: 03/2016 birad 1. Results were: normal  Obstetric History OB History  Gravida Para Term Preterm AB Living  1 1 1     1   SAB TAB Ectopic Multiple Live Births          1    # Outcome Date GA Lbr Len/2nd Weight Sex Delivery Anes PTL Lv  1 Term 1978   6 lb (2.722 kg) F Vag-Spont   LIV     Past Medical History:  Diagnosis Date  . Anxiety   . 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 Gall stones   . Hematuria   . Hyperlipidemia   . Hypertension   . Kidney stones   . Reflux   . Sinusitis   . Vitamin D deficiency    Past Surgical History:  Procedure Laterality Date  . APPENDECTOMY  1989  . OOPHORECTOMY  1989   Current Outpatient Prescriptions on File Prior to Visit  Medication Sig Dispense Refill  . cholecalciferol (VITAMIN D) 1000 UNITS tablet Take 1,000 Units by mouth 2 (two) times daily.    . Diphenhyd-Hydrocort-Nystatin (FIRST-DUKES MOUTHWASH) SUSP Use as directed 5 mLs in the mouth or throat 3 (three) times daily. Swish gargle and spit 140 mL 2  . fenofibrate 160 MG tablet Take 160 mg  by mouth daily.    . metoprolol tartrate (LOPRESSOR) 25 MG tablet Take 25 mg by mouth 2 (two) times daily.     No current facility-administered medications on file prior to visit.    No Known Allergies  Social History   Social History  . Marital status: Married    Spouse name: N/A  . Number of children: N/A  . Years of education: N/A   Occupational History  . Not on file.   Social History Main Topics  . Smoking status: Current Every Day Smoker    Packs/day: 1.00    Types: Cigarettes    Start date: 07/28/1979  . Smokeless tobacco: Never Used  . Alcohol use No  . Drug use: No  . Sexual activity: Yes    Birth control/ protection: Post-menopausal   Other Topics Concern  . Not on file   Social History Narrative  . No narrative on file    Family History  Problem Relation Age of Onset  . Kidney failure Mother   . Cancer Father   . Breast cancer Neg Hx   . Ovarian cancer Neg Hx   . Colon cancer Neg Hx   . Heart disease Neg Hx   . Diabetes Neg Hx    The following portions of the  patient's history were reviewed and updated as appropriate: allergies, current medications, past family history, past medical history, past social history, past surgical history and problem list.  Review of Systems Review of Systems  Constitutional: Negative for chills, diaphoresis, fever, malaise/fatigue and weight loss.  Eyes: Negative for blurred vision and double vision.  Respiratory: Negative for cough, shortness of breath and wheezing.   Cardiovascular: Negative for chest pain, palpitations and leg swelling.  Genitourinary: Negative for dysuria, frequency and urgency.  Skin: Negative for itching and rash.  Neurological: Negative for weakness and headaches.  All other systems reviewed and are negative.  Objective:  BP 114/67   Pulse 83   Ht 5\' 5"  (1.651 m)   Wt 131 lb 11.2 oz (59.7 kg)   BMI 21.92 kg/m  CONSTITUTIONAL: Well-developed, well-nourished female in no acute distress.   PSYCHIATRIC: Normal mood and affect. Normal behavior. Normal judgment and thought content. [place]: Alert and oriented to person, place, and time. Normal muscle tone coordination. No cranial nerve deficit noted. HENT:  Normocephalic, atraumatic EYES: Conjunctivae and EOM are normal. No scleral icterus.  NECK: Normal range of motion, supple, no masses.  Normal thyroid.  SKIN: Skin is warm and dry. No rash noted. Not diaphoretic. No erythema. No pallor. CARDIOVASCULAR: Normal heart rate noted, regular rhythm, no murmur. RESPIRATORY: Clear to auscultation bilaterally. Effort and breath sounds normal, no problems with respiration noted. BREASTS: Symmetric in size. No masses, skin changes, nipple drainage, or lymphadenopathy. ABDOMEN: Soft, normal bowel sounds, no distention noted.  No tenderness, rebound or guarding.  BLADDER: Normal PELVIC:  External Genitalia: Normal  BUS: Normal  Vagina: moderate to severe atrophy  Cervix: Normal; no lesions; no cervical motion tenderness   Uterus: Normal; midplane, normal size, shape, mobile  Adnexa: Normal  RV: External Exam NormaI, No Rectal Masses and Normal Sphincter tone  LYMPHATIC: No Axillary, Supraclavicular, or Inguinal Adenopathy.    Assessment:   Annual gynecologic examination 66 y.o. Contraception: post menopausal status Normal BMI Problem List Items Addressed This Visit    None    Visit Diagnoses    Vaginal atrophy    -  Primary   Well woman exam       Screen for colon cancer       Relevant Orders   Fecal occult blood, imunochemical   Menopause       Female dyspareunia        vaginal atrophy, moderate to severe, symptomatic  Plan:  1. Pap: not needed 2. Mammogram: February 2019 3. Stool Guaiac Testing:  Ordered 4. Labs: primary care 5.Routine preventative health maintenance measures emphasized: Exercise/Diet/Weight control, Tobacco Warnings and Alcohol/Substance use risks 6. Trial of Estrace cream 1/2 g intravaginal  twice weekly, can increase to three times if needed. 7. Continue Vitamin D Supplementation 8. Return to [place], CMA [place], Student-PA [facility]  Brayton Mars, MD   I have seen, interviewed, and examined the patient in conjunction with the [employer].A. student and affirm the diagnosis and management plan. Tianni Escamilla A. Curry Seefeldt, MD, FACOG    Note: This dictation was prepared with Dragon dictation along with smaller phrase technology. Any transcriptional errors that result from this process are unintentional.

## 2016-07-26 ENCOUNTER — Ambulatory Visit (INDEPENDENT_AMBULATORY_CARE_PROVIDER_SITE_OTHER): Payer: PPO | Admitting: Obstetrics and Gynecology

## 2016-07-26 ENCOUNTER — Encounter: Payer: Self-pay | Admitting: Obstetrics and Gynecology

## 2016-07-26 VITALS — BP 114/67 | HR 83 | Ht 65.0 in | Wt 131.7 lb

## 2016-07-26 DIAGNOSIS — Z780 Asymptomatic menopausal state: Secondary | ICD-10-CM | POA: Diagnosis not present

## 2016-07-26 DIAGNOSIS — N941 Unspecified dyspareunia: Secondary | ICD-10-CM | POA: Diagnosis not present

## 2016-07-26 DIAGNOSIS — Z1211 Encounter for screening for malignant neoplasm of colon: Secondary | ICD-10-CM

## 2016-07-26 DIAGNOSIS — N952 Postmenopausal atrophic vaginitis: Secondary | ICD-10-CM

## 2016-07-26 DIAGNOSIS — Z01419 Encounter for gynecological examination (general) (routine) without abnormal findings: Secondary | ICD-10-CM | POA: Diagnosis not present

## 2016-07-26 MED ORDER — ESTRADIOL 0.1 MG/GM VA CREA: 0.5000 | TOPICAL_CREAM | VAGINAL | 12 refills | Status: DC

## 2016-07-26 NOTE — Patient Instructions (Signed)
1. No Pap smear needed 2. Mammogram already done 3. Stool guaiac cards are given for colon cancer screening 4. Screening labs are done through primary care 5. Continue with healthy eating and exercise 6. Strongly encouraged calcium with vitamin D supplementation daily 7. CT scan of chest encouraged for lung cancer screening due togreater than 30-pack-year history of smoking 8. Premarin cream intravaginal twice weekly is prescribed and to be continued 9. Smoking cessation strongly encouraged 10. Return in 1 year  6 Health Maintenance for Postmenopausal Women Menopause is a normal process in which your reproductive ability comes to an end. This process happens gradually over a span of months to years, usually between the ages of 41 and 47. Menopause is complete when you have missed 12 consecutive menstrual periods. It is important to talk with your health care provider about some of the most common conditions that affect postmenopausal women, such as heart disease, cancer, and bone loss (osteoporosis). Adopting a healthy lifestyle and getting preventive care can help to promote your health and wellness. Those actions can also lower your chances of developing some of these common conditions. What should I know about menopause? During menopause, you may experience a number of symptoms, such as:  Moderate-to-severe hot flashes.  Night sweats.  Decrease in sex drive.  Mood swings.  Headaches.  Tiredness.  Irritability.  Memory problems.  Insomnia.  Choosing to treat or not to treat menopausal changes is an individual decision that you make with your health care provider. What should I know about hormone replacement therapy and supplements? Hormone therapy products are effective for treating symptoms that are associated with menopause, such as hot flashes and night sweats. Hormone replacement carries certain risks, especially as you become older. If you are thinking about using  estrogen or estrogen with progestin treatments, discuss the benefits and risks with your health care provider. What should I know about heart disease and stroke? Heart disease, heart attack, and stroke become more likely as you age. This may be due, in part, to the hormonal changes that your body experiences during menopause. These can affect how your body processes dietary fats, triglycerides, and cholesterol. Heart attack and stroke are both medical emergencies. There are many things that you can do to help prevent heart disease and stroke:  Have your blood pressure checked at least every 1-2 years. High blood pressure causes heart disease and increases the risk of stroke.  If you are 40-7 years old, ask your health care provider if you should take aspirin to prevent a heart attack or a stroke.  Do not use any tobacco products, including cigarettes, chewing tobacco, or electronic cigarettes. If you need help quitting, ask your health care provider.  It is important to eat a healthy diet and maintain a healthy weight. ? Be sure to include plenty of vegetables, fruits, low-fat dairy products, and lean protein. ? Avoid eating foods that are high in solid fats, added sugars, or salt (sodium).  Get regular exercise. This is one of the most important things that you can do for your health. ? Try to exercise for at least 150 minutes each week. The type of exercise that you do should increase your heart rate and make you sweat. This is known as moderate-intensity exercise. ? Try to do strengthening exercises at least twice each week. Do these in addition to the moderate-intensity exercise.  Know your numbers.Ask your health care provider to check your cholesterol and your blood glucose. Continue to have your  blood tested as directed by your health care provider.  What should I know about cancer screening? There are several types of cancer. Take the following steps to reduce your risk and to catch  any cancer development as early as possible. Breast Cancer  Practice breast self-awareness. ? This means understanding how your breasts normally appear and feel. ? It also means doing regular breast self-exams. Let your health care provider know about any changes, no matter how small.  If you are 40 or older, have a clinician do a breast exam (clinical breast exam or CBE) every year. Depending on your age, family history, and medical history, it may be recommended that you also have a yearly breast X-ray (mammogram).  If you have a family history of breast cancer, talk with your health care provider about genetic screening.  If you are at high risk for breast cancer, talk with your health care provider about having an MRI and a mammogram every year.  Breast cancer (BRCA) gene test is recommended for women who have family members with BRCA-related cancers. Results of the assessment will determine the need for genetic counseling and BRCA1 and for BRCA2 testing. BRCA-related cancers include these types: ? Breast. This occurs in males or females. ? Ovarian. ? Tubal. This may also be called fallopian tube cancer. ? Cancer of the abdominal or pelvic lining (peritoneal cancer). ? Prostate. ? Pancreatic.  Cervical, Uterine, and Ovarian Cancer Your health care provider may recommend that you be screened regularly for cancer of the pelvic organs. These include your ovaries, uterus, and vagina. This screening involves a pelvic exam, which includes checking for microscopic changes to the surface of your cervix (Pap test).  For women ages 21-65, health care providers may recommend a pelvic exam and a Pap test every three years. For women ages 40-65, they may recommend the Pap test and pelvic exam, combined with testing for human papilloma virus (HPV), every five years. Some types of HPV increase your risk of cervical cancer. Testing for HPV may also be done on women of any age who have unclear Pap test  results.  Other health care providers may not recommend any screening for nonpregnant women who are considered low risk for pelvic cancer and have no symptoms. Ask your health care provider if a screening pelvic exam is right for you.  If you have had past treatment for cervical cancer or a condition that could lead to cancer, you need Pap tests and screening for cancer for at least 20 years after your treatment. If Pap tests have been discontinued for you, your risk factors (such as having a new sexual partner) need to be reassessed to determine if you should start having screenings again. Some women have medical problems that increase the chance of getting cervical cancer. In these cases, your health care provider may recommend that you have screening and Pap tests more often.  If you have a family history of uterine cancer or ovarian cancer, talk with your health care provider about genetic screening.  If you have vaginal bleeding after reaching menopause, tell your health care provider.  There are currently no reliable tests available to screen for ovarian cancer.  Lung Cancer Lung cancer screening is recommended for adults 21-44 years old who are at high risk for lung cancer because of a history of smoking. A yearly low-dose CT scan of the lungs is recommended if you:  Currently smoke.  Have a history of at least 30 pack-years of smoking  and you currently smoke or have quit within the past 15 years. A pack-year is smoking an average of one pack of cigarettes per day for one year.  Yearly screening should:  Continue until it has been 15 years since you quit.  Stop if you develop a health problem that would prevent you from having lung cancer treatment.  Colorectal Cancer  This type of cancer can be detected and can often be prevented.  Routine colorectal cancer screening usually begins at age 13 and continues through age 49.  If you have risk factors for colon cancer, your health  care provider may recommend that you be screened at an earlier age.  If you have a family history of colorectal cancer, talk with your health care provider about genetic screening.  Your health care provider may also recommend using home test kits to check for hidden blood in your stool.  A small camera at the end of a tube can be used to examine your colon directly (sigmoidoscopy or colonoscopy). This is done to check for the earliest forms of colorectal cancer.  Direct examination of the colon should be repeated every 5-10 years until age 7. However, if early forms of precancerous polyps or small growths are found or if you have a family history or genetic risk for colorectal cancer, you may need to be screened more often.  Skin Cancer  Check your skin from head to toe regularly.  Monitor any moles. Be sure to tell your health care provider: ? About any new moles or changes in moles, especially if there is a change in a mole's shape or color. ? If you have a mole that is larger than the size of a pencil eraser.  If any of your family members has a history of skin cancer, especially at a young age, talk with your health care provider about genetic screening.  Always use sunscreen. Apply sunscreen liberally and repeatedly throughout the day.  Whenever you are outside, protect yourself by wearing long sleeves, pants, a wide-brimmed hat, and sunglasses.  What should I know about osteoporosis? Osteoporosis is a condition in which bone destruction happens more quickly than new bone creation. After menopause, you may be at an increased risk for osteoporosis. To help prevent osteoporosis or the bone fractures that can happen because of osteoporosis, the following is recommended:  If you are 77-73 years old, get at least 1,000 mg of calcium and at least 600 mg of vitamin D per day.  If you are older than age 90 but younger than age 46, get at least 1,200 mg of calcium and at least 600 mg of  vitamin D per day.  If you are older than age 60, get at least 1,200 mg of calcium and at least 800 mg of vitamin D per day.  Smoking and excessive alcohol intake increase the risk of osteoporosis. Eat foods that are rich in calcium and vitamin D, and do weight-bearing exercises several times each week as directed by your health care provider. What should I know about how menopause affects my mental health? Depression may occur at any age, but it is more common as you become older. Common symptoms of depression include:  Low or sad mood.  Changes in sleep patterns.  Changes in appetite or eating patterns.  Feeling an overall lack of motivation or enjoyment of activities that you previously enjoyed.  Frequent crying spells.  Talk with your health care provider if you think that you are experiencing  depression. What should I know about immunizations? It is important that you get and maintain your immunizations. These include:  Tetanus, diphtheria, and pertussis (Tdap) booster vaccine.  Influenza every year before the flu season begins.  Pneumonia vaccine.  Shingles vaccine.  Your health care provider may also recommend other immunizations. This information is not intended to replace advice given to you by your health care provider. Make sure you discuss any questions you have with your health care provider. Document Released: 03/10/2005 Document Revised: 08/06/2015 Document Reviewed: 10/20/2014 Elsevier Interactive Patient Education  2018 [employer].

## 2016-10-09 DIAGNOSIS — H40153 Residual stage of open-angle glaucoma, bilateral: Secondary | ICD-10-CM | POA: Diagnosis not present

## 2016-11-13 DIAGNOSIS — F17210 Nicotine dependence, cigarettes, uncomplicated: Secondary | ICD-10-CM | POA: Diagnosis not present

## 2016-11-13 DIAGNOSIS — J019 Acute sinusitis, unspecified: Secondary | ICD-10-CM | POA: Diagnosis not present

## 2016-11-13 DIAGNOSIS — R21 Rash and other nonspecific skin eruption: Secondary | ICD-10-CM | POA: Diagnosis not present

## 2016-11-13 DIAGNOSIS — L255 Unspecified contact dermatitis due to plants, except food: Secondary | ICD-10-CM | POA: Diagnosis not present

## 2016-12-06 ENCOUNTER — Encounter: Payer: Self-pay | Admitting: Urology

## 2016-12-06 ENCOUNTER — Ambulatory Visit: Payer: PPO | Admitting: Urology

## 2016-12-06 VITALS — BP 145/83 | HR 85 | Ht 65.0 in | Wt 133.0 lb

## 2016-12-06 DIAGNOSIS — Z87448 Personal history of other diseases of urinary system: Secondary | ICD-10-CM

## 2016-12-06 DIAGNOSIS — R350 Frequency of micturition: Secondary | ICD-10-CM | POA: Diagnosis not present

## 2016-12-06 DIAGNOSIS — N952 Postmenopausal atrophic vaginitis: Secondary | ICD-10-CM

## 2016-12-06 DIAGNOSIS — N3000 Acute cystitis without hematuria: Secondary | ICD-10-CM | POA: Diagnosis not present

## 2016-12-06 LAB — URINALYSIS, COMPLETE
Bilirubin, UA: NEGATIVE
GLUCOSE, UA: NEGATIVE
KETONES UA: NEGATIVE
NITRITE UA: NEGATIVE
Protein, UA: NEGATIVE
Specific Gravity, UA: <1.005 — ABNORMAL LOW (ref 1.005–1.030)
UUROB: 0.2 mg/dL (ref 0.2–1.0)
pH, UA: 7 (ref 5.0–7.5)

## 2016-12-06 LAB — MICROSCOPIC EXAMINATION: Bacteria, UA: NONE SEEN

## 2016-12-06 MED ORDER — NITROFURANTOIN MONOHYD MACRO 100 MG PO CAPS: 100.0000 mg | ORAL_CAPSULE | Freq: Two times a day (BID) | ORAL | 0 refills | Status: DC

## 2016-12-06 NOTE — Progress Notes (Signed)
12/06/2016 10:27 PM   Sharl Ma Amanda Cockayne 10-31-1950 [id number]  Referring provider: Ronnell Freshwater, NP [place], [place] [address]  Chief Complaint  Patient presents with  . Urinary Frequency  . Dysuria    HPI: Patient is a 66 year old Caucasian female with a history of hematuria and vaginal atrophy who presents today for dysuria.  She states that she has been experiencing intense burning and hurting when she urinates.  This started two days ago.  She has had associated urgency and frequency.  She has not had gross hematuria or suprapubic pain.  She has not had fevers, chills, nausea or vomiting.  Nothing worsens the dysuria.  Nothing helps the dysuria.  Her UA is positive for 11-30 WBC's.    History of hematuria CTU completed in 2016 was negative for any GU pathology.  Cystoscopy was negative.  No reports of gross hematuria.  Her UA today is negative for hematuria.    Vaginal atrophy Patient is using vaginal estrogen.   PMH: Past Medical History:  Diagnosis Date  . Anxiety   . 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 Gall stones   . Hematuria   . Hyperlipidemia   . Hypertension   . Reflux   . Sinusitis   . Vitamin D deficiency     Surgical History: Past Surgical History:  Procedure Laterality Date  . APPENDECTOMY  1989  . OOPHORECTOMY  1989    Home Medications:  Allergies as of 12/06/2016   No Known Allergies     Medication List        Accurate as of 12/06/16 11:59 PM. Always use your most recent med list.          cholecalciferol 1000 units tablet Commonly known as:  VITAMIN D Take 1,000 Units by mouth 2 (two) times daily.   estradiol 0.1 MG/GM vaginal cream Commonly known as:  ESTRACE VAGINAL Place 0.5 Applicatorfuls vaginally 2 (two) times a week.   EYE DROPS 0.05 % ophthalmic solution Generic drug:  tetrahydrozoline Place 1 drop daily into both eyes.   fenofibrate 160 MG tablet Take 160 mg by mouth daily.   metoprolol tartrate 25 MG  tablet Commonly known as:  LOPRESSOR Take 25 mg by mouth 2 (two) times daily.   nitrofurantoin (macrocrystal-monohydrate) 100 MG capsule Commonly known as:  MACROBID Take 1 capsule (100 mg total) every 12 (twelve) hours by mouth.       Allergies: No Known Allergies  Family History: Family History  Problem Relation Age of Onset  . Kidney failure Mother   . Cancer Father        Gastric  . Breast cancer Neg Hx   . Ovarian cancer Neg Hx   . Colon cancer Neg Hx   . Heart disease Neg Hx   . Diabetes Neg Hx     Social History:  reports that she has been smoking cigarettes.  She started smoking about 37 years ago. She has a 30.00 pack-year smoking history. she has never used smokeless tobacco. She reports that she does not drink alcohol or use drugs.  ROS: UROLOGY Frequent Urination?: Yes Hard to postpone urination?: No Burning/pain with urination?: Yes Get up at night to urinate?: No Leakage of urine?: No Urine stream starts and stops?: No Trouble starting stream?: No Do you have to strain to urinate?: No Blood in urine?: No Urinary tract infection?: Yes Sexually transmitted disease?: No Injury to kidneys or bladder?: No Painful intercourse?: No Weak stream?: No  Currently pregnant?: No Vaginal bleeding?: No  Gastrointestinal Nausea?: No Vomiting?: No Indigestion/heartburn?: No Diarrhea?: No Constipation?: No  Constitutional Fever: No Night sweats?: No Weight loss?: No Fatigue?: No  Skin Skin rash/lesions?: No Itching?: No  Eyes Blurred vision?: No Double vision?: No  Ears/Nose/Throat Sore throat?: No Sinus problems?: No  Hematologic/Lymphatic Swollen glands?: No Easy bruising?: No  Cardiovascular Leg swelling?: No Chest pain?: No  Respiratory Cough?: No Shortness of breath?: No  Endocrine Excessive thirst?: No  Musculoskeletal Back pain?: No Joint pain?: No  Neurological Headaches?: No Dizziness?: No  Psychologic Depression?:  No Anxiety?: No  Physical Exam: BP (!) 145/83   Pulse 85   Ht 5\' 5"  (1.651 m)   Wt 133 lb (60.3 kg)   BMI 22.13 kg/m   Constitutional: Well nourished. Alert and oriented, No acute distress. HEENT: [place] AT, moist mucus membranes. Trachea midline, no masses. Cardiovascular: No clubbing, cyanosis, or edema. Respiratory: Normal respiratory effort, no increased work of breathing. GI: Abdomen is soft, non tender, non distended, no abdominal masses. Liver and spleen not palpable.  No hernias appreciated.  Stool sample for occult testing is not indicated.   GU: No CVA tenderness.  No bladder fullness or masses.   Skin: No rashes, bruises or suspicious lesions. Lymph: No cervical or inguinal adenopathy. Neurologic: Grossly intact, no focal deficits, moving all 4 extremities. Psychiatric: Normal mood and affect.  Laboratory Data: Urinalysis 11-30 WBC's.  See Epic.  I have reviewed the labs.   Assessment & Plan:   1. Acute cystitis   - UA is suspicious for infection  - start Macrobid empirically - urine sent for culture - will adjust antibiotic if necessary  2.  History of hematuria  - patient's UA is negative for hematuria  - RTC in one year for UA  - report any gross hematuria  3. Atrophic vaginitis   - using the vaginal estrogen cream  Return in about 1 year (around 12/06/2017) for pending urine culture results.  These notes generated with voice recognition software. I apologize for typographical errors.  Zara Council, [facility] [address], [place], [place] [address] [phone]

## 2016-12-14 ENCOUNTER — Telehealth: Payer: Self-pay | Admitting: Urology

## 2016-12-14 NOTE — Telephone Encounter (Signed)
There was a miscommunication and Shari Gutierrez's urine did not get sent for culture.  Is she still having symptoms?  If so, we need to recheck her urine.  If she is better, we do need to see her in one year.

## 2016-12-20 DIAGNOSIS — J4520 Mild intermittent asthma, uncomplicated: Secondary | ICD-10-CM | POA: Diagnosis not present

## 2016-12-20 DIAGNOSIS — I10 Essential (primary) hypertension: Secondary | ICD-10-CM | POA: Diagnosis not present

## 2016-12-20 DIAGNOSIS — F17210 Nicotine dependence, cigarettes, uncomplicated: Secondary | ICD-10-CM | POA: Diagnosis not present

## 2016-12-20 DIAGNOSIS — F411 Generalized anxiety disorder: Secondary | ICD-10-CM | POA: Diagnosis not present

## 2016-12-20 DIAGNOSIS — Z0001 Encounter for general adult medical examination with abnormal findings: Secondary | ICD-10-CM | POA: Diagnosis not present

## 2016-12-20 DIAGNOSIS — E782 Mixed hyperlipidemia: Secondary | ICD-10-CM | POA: Diagnosis not present

## 2016-12-27 ENCOUNTER — Other Ambulatory Visit: Payer: Self-pay | Admitting: Nurse Practitioner

## 2016-12-27 DIAGNOSIS — Z1231 Encounter for screening mammogram for malignant neoplasm of breast: Secondary | ICD-10-CM

## 2017-01-01 DIAGNOSIS — F17210 Nicotine dependence, cigarettes, uncomplicated: Secondary | ICD-10-CM | POA: Diagnosis not present

## 2017-01-01 DIAGNOSIS — I10 Essential (primary) hypertension: Secondary | ICD-10-CM | POA: Diagnosis not present

## 2017-01-01 DIAGNOSIS — B373 Candidiasis of vulva and vagina: Secondary | ICD-10-CM | POA: Diagnosis not present

## 2017-01-01 DIAGNOSIS — J019 Acute sinusitis, unspecified: Secondary | ICD-10-CM | POA: Diagnosis not present

## 2017-01-15 ENCOUNTER — Other Ambulatory Visit: Payer: Self-pay

## 2017-01-15 ENCOUNTER — Telehealth: Payer: Self-pay

## 2017-01-15 MED ORDER — AMOXICILLIN 875 MG PO TABS: 875.0000 mg | ORAL_TABLET | Freq: Two times a day (BID) | ORAL | 0 refills | Status: DC

## 2017-01-15 NOTE — Telephone Encounter (Signed)
Pt  Called that having sinus infection  And she want second round is antibiotic as per heather is ok to send amoxicillin

## 2017-02-12 DIAGNOSIS — H40153 Residual stage of open-angle glaucoma, bilateral: Secondary | ICD-10-CM | POA: Diagnosis not present

## 2017-03-19 ENCOUNTER — Ambulatory Visit
Admission: RE | Admit: 2017-03-19 | Discharge: 2017-03-19 | Disposition: A | Discharge status: Home / Self Care | Payer: PPO | Source: Ambulatory Visit | Attending: Nurse Practitioner | Admitting: Nurse Practitioner

## 2017-03-19 DIAGNOSIS — Z1231 Encounter for screening mammogram for malignant neoplasm of breast: Secondary | ICD-10-CM | POA: Insufficient documentation

## 2017-04-23 ENCOUNTER — Ambulatory Visit (INDEPENDENT_AMBULATORY_CARE_PROVIDER_SITE_OTHER): Payer: PPO | Admitting: Nurse Practitioner

## 2017-04-23 ENCOUNTER — Encounter: Payer: Self-pay | Admitting: Nurse Practitioner

## 2017-04-23 VITALS — BP 110/68 | HR 92 | Temp 98.6°F | Resp 16 | Ht 65.0 in | Wt 132.0 lb

## 2017-04-23 DIAGNOSIS — I10 Essential (primary) hypertension: Secondary | ICD-10-CM | POA: Diagnosis not present

## 2017-04-23 DIAGNOSIS — J0100 Acute maxillary sinusitis, unspecified: Secondary | ICD-10-CM | POA: Diagnosis not present

## 2017-04-23 MED ORDER — AMOXICILLIN 875 MG PO TABS: 875.0000 mg | ORAL_TABLET | Freq: Two times a day (BID) | ORAL | 0 refills | Status: DC

## 2017-04-23 NOTE — Progress Notes (Signed)
[facility] [place], [place] [address]  Internal MEDICINE  Office Visit Note  Patient Name: Shari Gutierrez  [id number]  [id number]  Date of Service: 04/23/2017  Chief Complaint  Patient presents with  . Sinusitis    green color mucus     Sinusitis  This is a new problem. The current episode started in the past 7 days. The problem is unchanged. There has been no fever. Associated symptoms include chills, congestion, coughing, ear pain, headaches, sinus pressure and a sore throat. Treatments tried: advil. The treatment provided mild relief.   Pt is here for a sick visit.     Current Medication:  Outpatient Encounter Medications as of 04/23/2017  Medication Sig  . cholecalciferol (VITAMIN D) 1000 UNITS tablet Take 1,000 Units by mouth 2 (two) times daily.  Marland Kitchen estradiol (ESTRACE VAGINAL) 0.1 MG/GM vaginal cream Place 0.5 Applicatorfuls vaginally 2 (two) times a week.  . fenofibrate 160 MG tablet Take 160 mg by mouth daily.  . metoprolol tartrate (LOPRESSOR) 25 MG tablet Take 25 mg by mouth 2 (two) times daily.  Marland Kitchen amoxicillin (AMOXIL) 875 MG tablet Take 1 tablet (875 mg total) by mouth 2 (two) times daily.  Marland Kitchen tetrahydrozoline (EYE DROPS) 0.05 % ophthalmic solution Place 1 drop daily into both eyes.   . [DISCONTINUED] amoxicillin (AMOXIL) 875 MG tablet Take 1 tablet (875 mg total) by mouth 2 (two) times daily. (Patient not taking: Reported on 04/23/2017)  . [DISCONTINUED] nitrofurantoin, macrocrystal-monohydrate, (MACROBID) 100 MG capsule Take 1 capsule (100 mg total) every 12 (twelve) hours by mouth. (Patient not taking: Reported on 04/23/2017)   No facility-administered encounter medications on file as of 04/23/2017.       Medical History: Past Medical History:  Diagnosis Date  . Anxiety   . 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 Gall stones   . Hematuria   . Hyperlipidemia   . Hypertension   . Reflux   . Sinusitis   . Vitamin D deficiency      Vital  Signs: BP 110/68 (BP Location: Left Arm, Patient Position: Sitting, Cuff Size: Normal)   Pulse 92   Temp 98.6 F (37 C) (Oral)   Resp 16   Ht 5\' 5"  (1.651 m)   Wt 132 lb (59.9 kg)   SpO2 96%   BMI 21.97 kg/m    Review of Systems  Constitutional: Positive for chills. Negative for fatigue and fever.  HENT: Positive for congestion, ear pain, postnasal drip, rhinorrhea, sinus pressure, sinus pain, sore throat and voice change.   Eyes: Negative.   Respiratory: Positive for cough and wheezing.   Cardiovascular: Negative.   Gastrointestinal: Negative for abdominal distention, diarrhea, nausea and vomiting.  Endocrine: Negative for cold intolerance, heat intolerance, polydipsia, polyphagia and polyuria.  Musculoskeletal: Negative for arthralgias, back pain and myalgias.  Skin: Negative for rash.  Allergic/Immunologic: Positive for environmental allergies.  Neurological: Positive for headaches.  Psychiatric/Behavioral: Negative.     Physical Exam  Constitutional: She is oriented to person, place, and time. She appears well-developed and well-nourished. No distress.  HENT:  Head: Normocephalic and atraumatic.  Right Ear: Tympanic membrane is bulging.  Left Ear: Tympanic membrane is bulging.  Nose: Rhinorrhea present. Right sinus exhibits maxillary sinus tenderness. Left sinus exhibits maxillary sinus tenderness.  Mouth/Throat: Posterior oropharyngeal erythema present. No oropharyngeal exudate.  Copious post nasal drip evident   Eyes: Pupils are equal, round, and reactive to light. EOM are normal.  Neck: Normal range of motion. Neck supple.  No JVD present. No tracheal deviation present. No thyromegaly present.  Cardiovascular: Normal rate, regular rhythm and normal heart sounds. Exam reveals no gallop and no friction rub.  No murmur heard. Pulmonary/Chest: Effort normal and breath sounds normal. No respiratory distress. She has no wheezes. She has no rales. She exhibits no tenderness.   Scant wheezing auscultated which clear with cough.  Abdominal: Soft. Bowel sounds are normal.  Musculoskeletal: Normal range of motion.  Lymphadenopathy:    She has cervical adenopathy.  Neurological: She is alert and oriented to person, place, and time. No cranial nerve deficit.  Skin: Skin is warm and dry. She is not diaphoretic.  Psychiatric: She has a normal mood and affect. Her behavior is normal. Judgment and thought content normal.  Nursing note and vitals reviewed.   Assessment/Plan: 1. Acute non-recurrent maxillary sinusitis Amoxicillin 875mg  twice daily for 10 days. Samples of Mucinex DM given. May be used twice daily as needed for congestion and cough. May continue to take tylenol/ibuprofen for pain/fever.  - amoxicillin (AMOXIL) 875 MG tablet; Take 1 tablet (875 mg total) by mouth 2 (two) times daily.  Dispense: 20 tablet; Refill: 0  2. Essential hypertension Continue bp medication as prescribed .   General Counseling: Shari Gutierrez verbalizes understanding of the findings of todays visit and agrees with plan of treatment. I have discussed any further diagnostic evaluation that may be needed or ordered today. We also reviewed her medications today. she has been encouraged to call the office with any questions or concerns that should arise related to todays visit.  This patient was seen by Leretha Pol, FNP- C in Collaboration with Dr Lavera Guise as a part of collaborative care agreement   Meds ordered this encounter  Medications  . amoxicillin (AMOXIL) 875 MG tablet    Sig: Take 1 tablet (875 mg total) by mouth 2 (two) times daily.    Dispense:  20 tablet    Refill:  0    Order Specific Question:   Supervising Provider    Answer:   Lavera Guise [[id number]]    Time spent: 15  Minutes

## 2017-05-22 ENCOUNTER — Ambulatory Visit: Payer: PPO | Admitting: Nurse Practitioner

## 2017-05-22 ENCOUNTER — Encounter: Payer: Self-pay | Admitting: Nurse Practitioner

## 2017-05-22 VITALS — BP 120/80 | HR 100 | Temp 98.4°F | Resp 16 | Ht 65.0 in | Wt 132.0 lb

## 2017-05-22 DIAGNOSIS — J0100 Acute maxillary sinusitis, unspecified: Secondary | ICD-10-CM | POA: Diagnosis not present

## 2017-05-22 MED ORDER — AZITHROMYCIN 250 MG PO TABS: ORAL_TABLET | ORAL | 0 refills | Status: DC

## 2017-05-22 NOTE — Progress Notes (Signed)
[facility] [place], [place] [address]  Internal MEDICINE  Office Visit Note  Patient Name: Shari Gutierrez  [id number]  [id number]  Date of Service: 06/10/2017   Pt is here for a sick visit.  Chief Complaint  Patient presents with  . Sinusitis    going on for few days  . Cough     Sinusitis  This is a new problem. The current episode started in the past 7 days. The problem is unchanged. There has been no fever. Associated symptoms include chills, congestion, coughing, ear pain, headaches, sinus pressure and a sore throat. Treatments tried: advil. The treatment provided mild relief.        Current Medication:  Outpatient Encounter Medications as of 05/22/2017  Medication Sig  . amoxicillin (AMOXIL) 875 MG tablet Take 1 tablet (875 mg total) by mouth 2 (two) times daily.  Marland Kitchen azithromycin (ZITHROMAX) 250 MG tablet z-pack - take as directed for 5 days  . cholecalciferol (VITAMIN D) 1000 UNITS tablet Take 1,000 Units by mouth 2 (two) times daily.  Marland Kitchen estradiol (ESTRACE VAGINAL) 0.1 MG/GM vaginal cream Place 0.5 Applicatorfuls vaginally 2 (two) times a week.  . fenofibrate 160 MG tablet Take 160 mg by mouth daily.  . metoprolol tartrate (LOPRESSOR) 25 MG tablet Take 25 mg by mouth 2 (two) times daily.  Marland Kitchen tetrahydrozoline (EYE DROPS) 0.05 % ophthalmic solution Place 1 drop daily into both eyes.    No facility-administered encounter medications on file as of 05/22/2017.       Medical History: Past Medical History:  Diagnosis Date  . Anxiety   . 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 Gall stones   . Hematuria   . Hyperlipidemia   . Hypertension   . Reflux   . Sinusitis   . Vitamin D deficiency      Today's Vitals   05/22/17 1524  BP: 120/80  Pulse: 100  Resp: 16  Temp: 98.4 F (36.9 C)  SpO2: 96%  Weight: 132 lb (59.9 kg)  Height: 5\' 5"  (1.651 m)    Review of Systems  Constitutional: Positive for chills. Negative for fatigue and fever.  HENT:  Positive for congestion, ear pain, postnasal drip, rhinorrhea, sinus pressure, sinus pain, sore throat and voice change.   Eyes: Negative.   Respiratory: Positive for cough and wheezing.   Cardiovascular: Negative.   Gastrointestinal: Negative for abdominal distention, diarrhea, nausea and vomiting.  Endocrine: Negative for cold intolerance, heat intolerance, polydipsia, polyphagia and polyuria.  Musculoskeletal: Negative for arthralgias, back pain and myalgias.  Skin: Negative for rash.  Allergic/Immunologic: Positive for environmental allergies.  Neurological: Positive for headaches.  Psychiatric/Behavioral: Negative.     Physical Exam  Constitutional: She is oriented to person, place, and time. She appears well-developed and well-nourished. No distress.  HENT:  Head: Normocephalic and atraumatic.  Right Ear: Tympanic membrane is erythematous and bulging.  Left Ear: Tympanic membrane is erythematous and bulging.  Nose: Rhinorrhea present. Right sinus exhibits maxillary sinus tenderness and frontal sinus tenderness. Left sinus exhibits maxillary sinus tenderness and frontal sinus tenderness.  Mouth/Throat: Posterior oropharyngeal erythema present. No oropharyngeal exudate.  Eyes: Pupils are equal, round, and reactive to light. EOM are normal.  Neck: Normal range of motion. Neck supple. No JVD present. No tracheal deviation present. No thyromegaly present.  Cardiovascular: Normal rate, regular rhythm and normal heart sounds. Exam reveals no gallop and no friction rub.  No murmur heard. Pulmonary/Chest: Effort normal and breath sounds normal. No respiratory distress.  She has no wheezes. She has no rales. She exhibits no tenderness.  Congested, non-productive cough present.   Abdominal: Soft. Bowel sounds are normal. There is no tenderness.  Musculoskeletal: Normal range of motion.  Lymphadenopathy:    She has cervical adenopathy.  Neurological: She is alert and oriented to person,  place, and time. No cranial nerve deficit.  Skin: Skin is warm and dry. Capillary refill takes less than 2 seconds. She is not diaphoretic.  Psychiatric: She has a normal mood and affect. Her behavior is normal. Judgment and thought content normal.  Nursing note and vitals reviewed.  Assessment/Plan: 1. Acute non-recurrent maxillary sinusitis z-packk - take as directed. Rest and increase fluids. OTC medications recommended to allleviate symptoms.  - azithromycin (ZITHROMAX) 250 MG tablet; z-pack - take as directed for 5 days  Dispense: 6 tablet; Refill: 0  General Counseling: Ennifer verbalizes understanding of the findings of todays visit and agrees with plan of treatment. I have discussed any further diagnostic evaluation that may be needed or ordered today. We also reviewed her medications today. she has been encouraged to call the office with any questions or concerns that should arise related to todays visit.  Rest and increase fluids. Continue using OTC medication to control symptoms.   This patient was seen by Leretha Pol, FNP- C in Collaboration with Dr Lavera Guise as a part of collaborative care agreement  Meds ordered this encounter  Medications  . azithromycin (ZITHROMAX) 250 MG tablet    Sig: z-pack - take as directed for 5 days    Dispense:  6 tablet    Refill:  0    Order Specific Question:   Supervising Provider    Answer:   Lavera Guise [[id number]]    Time spent: 15 Minutes

## 2017-06-01 ENCOUNTER — Other Ambulatory Visit: Payer: Self-pay

## 2017-06-01 MED ORDER — PROAIR HFA 108 (90 BASE) MCG/ACT IN AERS: INHALATION_SPRAY | RESPIRATORY_TRACT | 2 refills | Status: DC

## 2017-06-13 DIAGNOSIS — H40153 Residual stage of open-angle glaucoma, bilateral: Secondary | ICD-10-CM | POA: Diagnosis not present

## 2017-06-18 ENCOUNTER — Ambulatory Visit: Payer: Self-pay | Admitting: Nurse Practitioner

## 2017-07-05 ENCOUNTER — Ambulatory Visit (INDEPENDENT_AMBULATORY_CARE_PROVIDER_SITE_OTHER): Payer: PPO | Admitting: Nurse Practitioner

## 2017-07-05 ENCOUNTER — Encounter: Payer: Self-pay | Admitting: Nurse Practitioner

## 2017-07-05 VITALS — BP 132/64 | HR 83 | Resp 16 | Ht 65.0 in | Wt 130.0 lb

## 2017-07-05 DIAGNOSIS — E785 Hyperlipidemia, unspecified: Secondary | ICD-10-CM

## 2017-07-05 DIAGNOSIS — J4520 Mild intermittent asthma, uncomplicated: Secondary | ICD-10-CM | POA: Diagnosis not present

## 2017-07-05 DIAGNOSIS — I10 Essential (primary) hypertension: Secondary | ICD-10-CM | POA: Diagnosis not present

## 2017-07-05 MED ORDER — MOMETASONE FUROATE 100 MCG/ACT IN AERO: 2.0000 | INHALATION_SPRAY | Freq: Two times a day (BID) | RESPIRATORY_TRACT | 3 refills | Status: DC

## 2017-07-05 NOTE — Progress Notes (Signed)
[facility] [place], [place] [address]  Internal MEDICINE  Office Visit Note  Patient Name: Shari Gutierrez  [id number]  [id number]  Date of Service: 07/23/2017  Chief Complaint  Patient presents with  . Asthma    using rescue inhaler more often    Asthma  She complains of cough, sputum production and wheezing. There is no shortness of breath. This is a recurrent problem. The current episode started more than 1 month ago. The problem occurs 2 to 4 times per day. The problem has been waxing and waning. Associated symptoms include dyspnea on exertion, headaches, postnasal drip and rhinorrhea. Pertinent negatives include no chest pain, sneezing or sore throat. Her symptoms are aggravated by nothing. Her symptoms are alleviated by beta-agonist. She reports moderate improvement on treatment. Her symptoms are not alleviated by anxiolytic and rest. Risk factors for lung disease include smoking/tobacco exposure. Her past medical history is significant for asthma.    Pt is here for routine follow up.    Current Medication: Outpatient Encounter Medications as of 07/05/2017  Medication Sig  . cholecalciferol (VITAMIN D) 1000 UNITS tablet Take 1,000 Units by mouth 2 (two) times daily.  Marland Kitchen estradiol (ESTRACE VAGINAL) 0.1 MG/GM vaginal cream Place 0.5 Applicatorfuls vaginally 2 (two) times a week.  . fenofibrate 160 MG tablet Take 160 mg by mouth daily.  Marland Kitchen latanoprost (XALATAN) 0.005 % ophthalmic solution INSTILL 1 DROP AT BEDTIME INTO BOTH EYES  . metoprolol tartrate (LOPRESSOR) 25 MG tablet Take 25 mg by mouth 2 (two) times daily.  . Mometasone Furoate (ASMANEX HFA) 100 MCG/ACT AERO Inhale 2 puffs into the lungs 2 (two) times daily.  Marland Kitchen PROAIR HFA 108 (90 Base) MCG/ACT inhaler USE 1-2 PUFFS 3 TIMES DAILY IF NEEDED FOR WHEEZING  . tetrahydrozoline (EYE DROPS) 0.05 % ophthalmic solution Place 1 drop daily into both eyes.   . [DISCONTINUED] amoxicillin (AMOXIL) 875 MG tablet  Take 1 tablet (875 mg total) by mouth 2 (two) times daily. (Patient not taking: Reported on 07/05/2017)  . [DISCONTINUED] azithromycin (ZITHROMAX) 250 MG tablet z-pack - take as directed for 5 days (Patient not taking: Reported on 07/05/2017)   No facility-administered encounter medications on file as of 07/05/2017.     Surgical History: Past Surgical History:  Procedure Laterality Date  . APPENDECTOMY  1989  . OOPHORECTOMY  1989    Medical History: Past Medical History:  Diagnosis Date  . Anxiety   . 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 Gall stones   . Hematuria   . Hyperlipidemia   . Hypertension   . Reflux   . Sinusitis   . Vitamin D deficiency     Family History: Family History  Problem Relation Age of Onset  . Kidney failure Mother   . Cancer Father        Gastric  . Breast cancer Neg Hx   . Ovarian cancer Neg Hx   . Colon cancer Neg Hx   . Heart disease Neg Hx   . Diabetes Neg Hx     Social History   Socioeconomic History  . Marital status: Married    Spouse name: Not on file  . Number of children: Not on file  . Years of education: Not on file  . Highest education level: Not on file  Occupational History  . Not on file  Social Needs  . Financial resource strain: Not on file  . Food insecurity:    Worry: Not on file  Inability: Not on file  . Transportation needs:    Medical: Not on file    Non-medical: Not on file  Tobacco Use  . Smoking status: Current Every Day Smoker    Packs/day: 1.00    Years: 30.00    Pack years: 30.00    Types: Cigarettes    Start date: 07/28/1979  . Smokeless tobacco: Never Used  Substance and Sexual Activity  . Alcohol use: No    Alcohol/week: 0.0 oz  . Drug use: No  . Sexual activity: Yes    Birth control/protection: Post-menopausal  Lifestyle  . Physical activity:    Days per week: Not on file    Minutes per session: Not on file  . Stress: Not on file  Relationships  . Social connections:    Talks on phone: Not on file     Gets together: Not on file    Attends religious service: Not on file    Active member of club or organization: Not on file    Attends meetings of clubs or organizations: Not on file    Relationship status: Not on file  . Intimate partner violence:    Fear of current or ex partner: Not on file    Emotionally abused: Not on file    Physically abused: Not on file    Forced sexual activity: Not on file  Other Topics Concern  . Not on file  Social History Narrative  . Not on file      Review of Systems  Constitutional: Negative for activity change, chills, fatigue and unexpected weight change.  HENT: Positive for congestion, postnasal drip and rhinorrhea. Negative for sneezing and sore throat.   Eyes: Negative.  Negative for redness.  Respiratory: Positive for cough, sputum production and wheezing. Negative for chest tightness and shortness of breath.        States that she is using rescue inhaler at least once per day. Most days, she will have to use it 3 or 4 tmes per day.   Cardiovascular: Positive for dyspnea on exertion. Negative for chest pain and palpitations.  Gastrointestinal: Negative for abdominal pain, constipation, diarrhea, nausea and vomiting.  Endocrine: Negative for cold intolerance, heat intolerance, polydipsia, polyphagia and polyuria.  Genitourinary: Negative.  Negative for dysuria and frequency.  Musculoskeletal: Negative for arthralgias, back pain, joint swelling and neck pain.  Skin: Negative for rash.  Allergic/Immunologic: Positive for environmental allergies.  Neurological: Positive for headaches. Negative for tremors and numbness.  Hematological: Negative for adenopathy. Does not bruise/bleed easily.  Psychiatric/Behavioral: Negative for behavioral problems (Depression), sleep disturbance and suicidal ideas. The patient is nervous/anxious.     Today's Vitals   07/05/17 1103  BP: 132/64  Pulse: 83  Resp: 16  SpO2: 96%  Weight: 130 lb (59 kg)  Height: 5'  5" (1.651 m)    Physical Exam  Constitutional: She is oriented to person, place, and time. She appears well-developed and well-nourished. No distress.  HENT:  Head: Normocephalic and atraumatic.  Right Ear: Tympanic membrane is erythematous and bulging.  Left Ear: Tympanic membrane is erythematous and bulging.  Nose: Rhinorrhea present. Right sinus exhibits maxillary sinus tenderness and frontal sinus tenderness. Left sinus exhibits maxillary sinus tenderness and frontal sinus tenderness.  Mouth/Throat: Posterior oropharyngeal erythema present. No oropharyngeal exudate.  Eyes: Pupils are equal, round, and reactive to light. EOM are normal.  Neck: Normal range of motion. Neck supple. No JVD present. No tracheal deviation present. No thyromegaly present.  Cardiovascular: Normal rate,  regular rhythm and normal heart sounds. Exam reveals no gallop and no friction rub.  No murmur heard. Pulmonary/Chest: Effort normal and breath sounds normal. No respiratory distress. She has no wheezes. She has no rales. She exhibits no tenderness.  Dry, non-productive, intermittent cough.   Abdominal: Soft. Bowel sounds are normal. There is no tenderness.  Musculoskeletal: Normal range of motion.  Lymphadenopathy:    She has cervical adenopathy.  Neurological: She is alert and oriented to person, place, and time. No cranial nerve deficit.  Skin: Skin is warm and dry. Capillary refill takes less than 2 seconds. She is not diaphoretic.  Psychiatric: She has a normal mood and affect. Her behavior is normal. Judgment and thought content normal.  Nursing note and vitals reviewed.   Assessment/Plan: 1. Mild intermittent asthma, unspecified whether complicated Add asmanex HFA. Use 2 inhalations twice daily. Continue to use rescue inhaler as needed and as prescribed.  - Mometasone Furoate (ASMANEX HFA) 100 MCG/ACT AERO; Inhale 2 puffs into the lungs 2 (two) times daily.  Dispense: 1 Inhaler; Refill: 3  2.  Essential hypertension Stable. Continue bp medication as prescribed.   3. Hyperlipidemia, unspecified hyperlipidemia type Continue fenofibrate as prescribed.   General Counseling: Carlia verbalizes understanding of the findings of todays visit and agrees with plan of treatment. I have discussed any further diagnostic evaluation that may be needed or ordered today. We also reviewed her medications today. she has been encouraged to call the office with any questions or concerns that should arise related to todays visit.    Counseling:  This patient was seen by Leretha Pol, FNP- C in Collaboration with Dr Lavera Guise as a part of collaborative care agreement  Meds ordered this encounter  Medications  . Mometasone Furoate (ASMANEX HFA) 100 MCG/ACT AERO    Sig: Inhale 2 puffs into the lungs 2 (two) times daily.    Dispense:  1 Inhaler    Refill:  3    Samples provided    Order Specific Question:   Supervising Provider    Answer:   Lavera Guise [[id number]]    Time spent: 18 Minutes      Dr Lavera Guise Internal medicine

## 2017-07-23 DIAGNOSIS — E782 Mixed hyperlipidemia: Secondary | ICD-10-CM | POA: Insufficient documentation

## 2017-07-23 DIAGNOSIS — J4520 Mild intermittent asthma, uncomplicated: Secondary | ICD-10-CM | POA: Insufficient documentation

## 2017-07-23 DIAGNOSIS — E785 Hyperlipidemia, unspecified: Secondary | ICD-10-CM | POA: Insufficient documentation

## 2017-07-25 ENCOUNTER — Other Ambulatory Visit: Payer: Self-pay

## 2017-07-25 MED ORDER — METOPROLOL TARTRATE 25 MG PO TABS: 25.0000 mg | ORAL_TABLET | Freq: Two times a day (BID) | ORAL | 3 refills | Status: DC

## 2017-07-30 NOTE — Progress Notes (Signed)
ANNUAL PREVENTATIVE CARE GYN  ENCOUNTER NOTE  Subjective:       Shari Gutierrez is a 67 y.o. G50P1001 female with 30+ pack year smoking history is here for a routine annual gynecologic exam.  Current complaints: 1.  Vaginal dryness-patient is using Estrace cream only once a week if she remembers.  She does occasionally use lubricants.  [name] is taking calcium with vitamin D and is working in the garden and walking daily.  [place]  [name] does have a long smoking history and is not interested in quitting. She has declined colonoscopy and low-dose CT scan for lung cancer screening offered by primary care.   Gynecologic History No LMP recorded. Patient is postmenopausal. Contraception: post menopausal status Pap smear-07/2015 neg/neg Last mammogram: 03/2017 birad 1. Results were: normal  Obstetric History OB History  Gravida Para Term Preterm AB Living  1 1 1     1   SAB TAB Ectopic Multiple Live Births          1    # Outcome Date GA Lbr Len/2nd Weight Sex Delivery Anes PTL Lv  1 Term 1978   6 lb (2.722 kg) F Vag-Spont   LIV   Past Medical History:  Diagnosis Date  . Anxiety   . 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 Gall stones   . Hematuria   . Hyperlipidemia   . Hypertension   . Reflux   . Sinusitis   . Vitamin D deficiency    Past Surgical History:  Procedure Laterality Date  . APPENDECTOMY  1989  . OOPHORECTOMY  1989   Current Outpatient Medications on File Prior to Visit  Medication Sig Dispense Refill  . cholecalciferol (VITAMIN D) 1000 UNITS tablet Take 1,000 Units by mouth 2 (two) times daily.    Marland Kitchen estradiol (ESTRACE VAGINAL) 0.1 MG/GM vaginal cream Place 0.5 Applicatorfuls vaginally 2 (two) times a week. 42.5 g 12  . fenofibrate 160 MG tablet Take 160 mg by mouth daily.    Marland Kitchen latanoprost (XALATAN) 0.005 % ophthalmic solution INSTILL 1 DROP AT BEDTIME INTO BOTH EYES  4  . metoprolol tartrate (LOPRESSOR) 25 MG tablet Take 1 tablet  (25 mg total) by mouth 2 (two) times daily. 60 tablet 3  . Mometasone Furoate (ASMANEX HFA) 100 MCG/ACT AERO Inhale 2 puffs into the lungs 2 (two) times daily. 1 Inhaler 3  . PROAIR HFA 108 (90 Base) MCG/ACT inhaler USE 1-2 PUFFS 3 TIMES DAILY IF NEEDED FOR WHEEZING 3 Inhaler 2  . tetrahydrozoline (EYE DROPS) 0.05 % ophthalmic solution Place 1 drop daily into both eyes.      No current facility-administered medications on file prior to visit.    No Known Allergies  Social History   Socioeconomic History  . Marital status: Married    Spouse name: Not on file  . Number of children: Not on file  . Years of education: Not on file  . Highest education level: Not on file  Occupational History  . Not on file  Social Needs  . Financial resource strain: Not on file  . Food insecurity:    Worry: Not on file    Inability: Not on file  . Transportation needs:    Medical: Not on file    Non-medical: Not on file  Tobacco Use  . Smoking status: Current Every Day Smoker    Packs/day: 1.00    Years: 30.00    Pack years: 30.00  Types: Cigarettes    Start date: 07/28/1979  . Smokeless tobacco: Never Used  Substance and Sexual Activity  . Alcohol use: No    Alcohol/week: 0.0 oz  . Drug use: No  . Sexual activity: Yes    Birth control/protection: Post-menopausal  Lifestyle  . Physical activity:    Days per week: Not on file    Minutes per session: Not on file  . Stress: Not on file  Relationships  . Social connections:    Talks on phone: Not on file    Gets together: Not on file    Attends religious service: Not on file    Active member of club or organization: Not on file    Attends meetings of clubs or organizations: Not on file    Relationship status: Not on file  . Intimate partner violence:    Fear of current or ex partner: Not on file    Emotionally abused: Not on file    Physically abused: Not on file    Forced sexual activity: Not on file  Other Topics Concern  . Not  on file  Social History Narrative  . Not on file    Family History  Problem Relation Age of Onset  . Kidney failure Mother   . Cancer Father        Gastric  . Breast cancer Neg Hx   . Ovarian cancer Neg Hx   . Colon cancer Neg Hx   . Heart disease Neg Hx   . Diabetes Neg Hx    The following portions of the patient's history were reviewed and updated as appropriate: allergies, current medications, past family history, past medical history, past social history, past surgical history and problem list.  Review of Systems Review of Systems  Constitutional: Negative.   HENT: Negative.   Eyes: Negative.   Respiratory: Negative.   Cardiovascular: Negative.   Gastrointestinal: Negative.   Genitourinary: Negative.        Vaginal dryness, stable; not using estrogen cream consistently  Musculoskeletal: Negative.   Skin: Negative.   Neurological: Negative.   Endo/Heme/Allergies: Negative.   Psychiatric/Behavioral: Negative.     Objective:  BP 115/74   Pulse 79   Ht 5\' 5"  (1.651 m)   Wt 129 lb 8 oz (58.7 kg)   BMI 21.55 kg/m  CONSTITUTIONAL: Well-developed, well-nourished female in no acute distress.  PSYCHIATRIC: Normal mood and affect. Normal behavior. Normal judgment and thought content. [place]: Alert and oriented to person, place, and time. Normal muscle tone coordination. No cranial nerve deficit noted. HENT:  Normocephalic, atraumatic EYES: Conjunctivae and EOM are normal. No scleral icterus.  NECK: Normal range of motion, supple, no masses.  Normal thyroid.  SKIN: Skin is warm and dry. No rash noted. Not diaphoretic. No erythema. No pallor. CARDIOVASCULAR: Normal heart rate noted, regular rhythm, no murmur. RESPIRATORY: Clear to auscultation bilaterally. Effort and breath sounds normal, no problems with respiration noted. BREASTS: Symmetric in size. No masses, skin changes, nipple drainage, or lymphadenopathy. ABDOMEN: Soft, no distention noted.  No tenderness, rebound  or guarding.  BLADDER: Normal PELVIC:  External Genitalia: Normal  BUS: Normal  Vagina: Severe atrophy  Cervix: Normal; no lesions; no cervical motion tenderness   Uterus: Normal; midplane, small, normal shape, mobile  Adnexa: Normal; nonpalpable nontender  RV: External Exam NormaI, No Rectal Masses and Normal Sphincter tone  LYMPHATIC: No Axillary, Supraclavicular, or Inguinal Adenopathy.    Assessment:   Annual gynecologic examination 67 y.o. Contraception: post menopausal status  Normal BMI Problem List Items Addressed This Visit    Menopause   Vaginal atrophy   Female dyspareunia    Other Visit Diagnoses    Well woman exam    -  Primary   Screen for colon cancer        vaginal atrophy, severe, somewhat symptomatic at times  Plan:  1. Pap:Due 2020 2. Mammogram: UTD 3. Stool Guaiac Testing:  Ordered 4. Labs: primary care 5.Routine preventative health maintenance measures emphasized: Exercise/Diet/Weight control, Tobacco Warnings and Alcohol/Substance use risks 6.  Continue Estrace cream 1/2 g intravaginal twice weekly 7. Continue Vitamin D Supplementation.  Recommend calcium 1200 mg a day. 8. Return to [place], [place] Brayton Mars, MD  Note: This dictation was prepared with Dragon dictation along with smaller phrase technology. Any transcriptional errors that result from this process are unintentional.

## 2017-07-31 ENCOUNTER — Encounter: Payer: Self-pay | Admitting: Obstetrics and Gynecology

## 2017-07-31 ENCOUNTER — Ambulatory Visit (INDEPENDENT_AMBULATORY_CARE_PROVIDER_SITE_OTHER): Payer: PPO | Admitting: Obstetrics and Gynecology

## 2017-07-31 VITALS — BP 115/74 | HR 79 | Ht 65.0 in | Wt 129.5 lb

## 2017-07-31 DIAGNOSIS — Z01411 Encounter for gynecological examination (general) (routine) with abnormal findings: Secondary | ICD-10-CM

## 2017-07-31 DIAGNOSIS — N952 Postmenopausal atrophic vaginitis: Secondary | ICD-10-CM | POA: Diagnosis not present

## 2017-07-31 DIAGNOSIS — N941 Unspecified dyspareunia: Secondary | ICD-10-CM | POA: Diagnosis not present

## 2017-07-31 DIAGNOSIS — Z1211 Encounter for screening for malignant neoplasm of colon: Secondary | ICD-10-CM

## 2017-07-31 DIAGNOSIS — Z1212 Encounter for screening for malignant neoplasm of rectum: Secondary | ICD-10-CM | POA: Diagnosis not present

## 2017-07-31 DIAGNOSIS — Z01419 Encounter for gynecological examination (general) (routine) without abnormal findings: Secondary | ICD-10-CM

## 2017-07-31 DIAGNOSIS — F17210 Nicotine dependence, cigarettes, uncomplicated: Secondary | ICD-10-CM | POA: Diagnosis not present

## 2017-07-31 DIAGNOSIS — Z780 Asymptomatic menopausal state: Secondary | ICD-10-CM | POA: Diagnosis not present

## 2017-07-31 MED ORDER — ESTRADIOL 0.1 MG/GM VA CREA: 0.5000 | TOPICAL_CREAM | VAGINAL | 12 refills | Status: DC

## 2017-07-31 NOTE — Patient Instructions (Signed)
1.  Pap smear is not done.  Next Pap smear is due 2020 2.  Mammogram up-to-date 3.  Stool guaiac testing is ordered for colon cancer screening 4.  Screening labs are to be obtained through primary care 5.  Continue with healthy eating and exercise. 6.  Continue with calcium and vitamin D supplementation 7.  Smoking cessation encouraged 8.  Return in 1 year for physical   Health Maintenance for Postmenopausal Women Menopause is a normal process in which your reproductive ability comes to an end. This process happens gradually over a span of months to years, usually between the ages of 31 and 21. Menopause is complete when you have missed 12 consecutive menstrual periods. It is important to talk with your health care provider about some of the most common conditions that affect postmenopausal women, such as heart disease, cancer, and bone loss (osteoporosis). Adopting a healthy lifestyle and getting preventive care can help to promote your health and wellness. Those actions can also lower your chances of developing some of these common conditions. What should I know about menopause? During menopause, you may experience a number of symptoms, such as:  Moderate-to-severe hot flashes.  Night sweats.  Decrease in sex drive.  Mood swings.  Headaches.  Tiredness.  Irritability.  Memory problems.  Insomnia.  Choosing to treat or not to treat menopausal changes is an individual decision that you make with your health care provider. What should I know about hormone replacement therapy and supplements? Hormone therapy products are effective for treating symptoms that are associated with menopause, such as hot flashes and night sweats. Hormone replacement carries certain risks, especially as you become older. If you are thinking about using estrogen or estrogen with progestin treatments, discuss the benefits and risks with your health care provider. What should I know about heart disease and  stroke? Heart disease, heart attack, and stroke become more likely as you age. This may be due, in part, to the hormonal changes that your body experiences during menopause. These can affect how your body processes dietary fats, triglycerides, and cholesterol. Heart attack and stroke are both medical emergencies. There are many things that you can do to help prevent heart disease and stroke:  Have your blood pressure checked at least every 1-2 years. High blood pressure causes heart disease and increases the risk of stroke.  If you are 35-64 years old, ask your health care provider if you should take aspirin to prevent a heart attack or a stroke.  Do not use any tobacco products, including cigarettes, chewing tobacco, or electronic cigarettes. If you need help quitting, ask your health care provider.  It is important to eat a healthy diet and maintain a healthy weight. ? Be sure to include plenty of vegetables, fruits, low-fat dairy products, and lean protein. ? Avoid eating foods that are high in solid fats, added sugars, or salt (sodium).  Get regular exercise. This is one of the most important things that you can do for your health. ? Try to exercise for at least 150 minutes each week. The type of exercise that you do should increase your heart rate and make you sweat. This is known as moderate-intensity exercise. ? Try to do strengthening exercises at least twice each week. Do these in addition to the moderate-intensity exercise.  Know your numbers.Ask your health care provider to check your cholesterol and your blood glucose. Continue to have your blood tested as directed by your health care provider.  What should  I know about cancer screening? There are several types of cancer. Take the following steps to reduce your risk and to catch any cancer development as early as possible. Breast Cancer  Practice breast self-awareness. ? This means understanding how your breasts normally appear  and feel. ? It also means doing regular breast self-exams. Let your health care provider know about any changes, no matter how small.  If you are 62 or older, have a clinician do a breast exam (clinical breast exam or CBE) every year. Depending on your age, family history, and medical history, it may be recommended that you also have a yearly breast X-ray (mammogram).  If you have a family history of breast cancer, talk with your health care provider about genetic screening.  If you are at high risk for breast cancer, talk with your health care provider about having an MRI and a mammogram every year.  Breast cancer (BRCA) gene test is recommended for women who have family members with BRCA-related cancers. Results of the assessment will determine the need for genetic counseling and BRCA1 and for BRCA2 testing. BRCA-related cancers include these types: ? Breast. This occurs in males or females. ? Ovarian. ? Tubal. This may also be called fallopian tube cancer. ? Cancer of the abdominal or pelvic lining (peritoneal cancer). ? Prostate. ? Pancreatic.  Cervical, Uterine, and Ovarian Cancer Your health care provider may recommend that you be screened regularly for cancer of the pelvic organs. These include your ovaries, uterus, and vagina. This screening involves a pelvic exam, which includes checking for microscopic changes to the surface of your cervix (Pap test).  For women ages 21-65, health care providers may recommend a pelvic exam and a Pap test every three years. For women ages 44-65, they may recommend the Pap test and pelvic exam, combined with testing for human papilloma virus (HPV), every five years. Some types of HPV increase your risk of cervical cancer. Testing for HPV may also be done on women of any age who have unclear Pap test results.  Other health care providers may not recommend any screening for nonpregnant women who are considered low risk for pelvic cancer and have no  symptoms. Ask your health care provider if a screening pelvic exam is right for you.  If you have had past treatment for cervical cancer or a condition that could lead to cancer, you need Pap tests and screening for cancer for at least 20 years after your treatment. If Pap tests have been discontinued for you, your risk factors (such as having a new sexual partner) need to be reassessed to determine if you should start having screenings again. Some women have medical problems that increase the chance of getting cervical cancer. In these cases, your health care provider may recommend that you have screening and Pap tests more often.  If you have a family history of uterine cancer or ovarian cancer, talk with your health care provider about genetic screening.  If you have vaginal bleeding after reaching menopause, tell your health care provider.  There are currently no reliable tests available to screen for ovarian cancer.  Lung Cancer Lung cancer screening is recommended for adults 73-51 years old who are at high risk for lung cancer because of a history of smoking. A yearly low-dose CT scan of the lungs is recommended if you:  Currently smoke.  Have a history of at least 30 pack-years of smoking and you currently smoke or have quit within the past 15 years.  A pack-year is smoking an average of one pack of cigarettes per day for one year.  Yearly screening should:  Continue until it has been 15 years since you quit.  Stop if you develop a health problem that would prevent you from having lung cancer treatment.  Colorectal Cancer  This type of cancer can be detected and can often be prevented.  Routine colorectal cancer screening usually begins at age 55 and continues through age 29.  If you have risk factors for colon cancer, your health care provider may recommend that you be screened at an earlier age.  If you have a family history of colorectal cancer, talk with your health care  provider about genetic screening.  Your health care provider may also recommend using home test kits to check for hidden blood in your stool.  A small camera at the end of a tube can be used to examine your colon directly (sigmoidoscopy or colonoscopy). This is done to check for the earliest forms of colorectal cancer.  Direct examination of the colon should be repeated every 5-10 years until age 29. However, if early forms of precancerous polyps or small growths are found or if you have a family history or genetic risk for colorectal cancer, you may need to be screened more often.  Skin Cancer  Check your skin from head to toe regularly.  Monitor any moles. Be sure to tell your health care provider: ? About any new moles or changes in moles, especially if there is a change in a mole's shape or color. ? If you have a mole that is larger than the size of a pencil eraser.  If any of your family members has a history of skin cancer, especially at a young age, talk with your health care provider about genetic screening.  Always use sunscreen. Apply sunscreen liberally and repeatedly throughout the day.  Whenever you are outside, protect yourself by wearing long sleeves, pants, a wide-brimmed hat, and sunglasses.  What should I know about osteoporosis? Osteoporosis is a condition in which bone destruction happens more quickly than new bone creation. After menopause, you may be at an increased risk for osteoporosis. To help prevent osteoporosis or the bone fractures that can happen because of osteoporosis, the following is recommended:  If you are 30-79 years old, get at least 1,000 mg of calcium and at least 600 mg of vitamin D per day.  If you are older than age 73 but younger than age 32, get at least 1,200 mg of calcium and at least 600 mg of vitamin D per day.  If you are older than age 48, get at least 1,200 mg of calcium and at least 800 mg of vitamin D per day.  Smoking and excessive  alcohol intake increase the risk of osteoporosis. Eat foods that are rich in calcium and vitamin D, and do weight-bearing exercises several times each week as directed by your health care provider. What should I know about how menopause affects my mental health? Depression may occur at any age, but it is more common as you become older. Common symptoms of depression include:  Low or sad mood.  Changes in sleep patterns.  Changes in appetite or eating patterns.  Feeling an overall lack of motivation or enjoyment of activities that you previously enjoyed.  Frequent crying spells.  Talk with your health care provider if you think that you are experiencing depression. What should I know about immunizations? It is important that you  get and maintain your immunizations. These include:  Tetanus, diphtheria, and pertussis (Tdap) booster vaccine.  Influenza every year before the flu season begins.  Pneumonia vaccine.  Shingles vaccine.  Your health care provider may also recommend other immunizations. This information is not intended to replace advice given to you by your health care provider. Make sure you discuss any questions you have with your health care provider. Document Released: 03/10/2005 Document Revised: 08/06/2015 Document Reviewed: 10/20/2014 Elsevier Interactive Patient Education  2018 [employer].

## 2017-08-07 ENCOUNTER — Ambulatory Visit: Payer: Self-pay | Admitting: Nurse Practitioner

## 2017-08-08 ENCOUNTER — Telehealth: Payer: Self-pay | Admitting: Obstetrics and Gynecology

## 2017-08-08 MED ORDER — ESTROGENS, CONJUGATED 0.625 MG/GM VA CREA: 0.2500 | TOPICAL_CREAM | VAGINAL | 12 refills | Status: DC

## 2017-08-08 NOTE — Telephone Encounter (Signed)
Pt stopped by office today. Estrace cream is too expensive. Premarin samples given. Will erx Premarin. If still too high then pt will consider compound.

## 2017-08-08 NOTE — Telephone Encounter (Signed)
Pt states she will get estrace cream  Which is less expensive than premarin. Offered compound but pt states the estrace is just as cheap.  90.00 per 3 months.

## 2017-08-08 NOTE — Telephone Encounter (Signed)
The patient called and LVM at 12:18 requesting Crystal to call her please. Good call back number is [phone], thanks.

## 2017-08-08 NOTE — Telephone Encounter (Signed)
Patient is here asking for another cream than the Estradiol as her insurance does not cover it and she needs samples or another med sent to pharmacy.  Thanks

## 2017-08-14 ENCOUNTER — Other Ambulatory Visit: Payer: Self-pay | Admitting: Nurse Practitioner

## 2017-08-14 DIAGNOSIS — E782 Mixed hyperlipidemia: Secondary | ICD-10-CM | POA: Diagnosis not present

## 2017-08-14 DIAGNOSIS — I10 Essential (primary) hypertension: Secondary | ICD-10-CM | POA: Diagnosis not present

## 2017-08-14 DIAGNOSIS — E559 Vitamin D deficiency, unspecified: Secondary | ICD-10-CM | POA: Diagnosis not present

## 2017-08-14 DIAGNOSIS — Z0001 Encounter for general adult medical examination with abnormal findings: Secondary | ICD-10-CM | POA: Diagnosis not present

## 2017-08-15 LAB — LIPID PANEL W/O CHOL/HDL RATIO
CHOLESTEROL TOTAL: 153 mg/dL (ref 100–199)
HDL: 28 mg/dL — ABNORMAL LOW (ref >39)
LDL Calculated: 108 mg/dL — ABNORMAL HIGH (ref 0–99)
Triglycerides: 85 mg/dL (ref 0–149)
VLDL Cholesterol Cal: 17 mg/dL (ref 5–40)

## 2017-08-15 LAB — CBC
Hematocrit: 40.1 % (ref 34.0–46.6)
Hemoglobin: 13.5 g/dL (ref 11.1–15.9)
MCH: 32.2 pg (ref 26.6–33.0)
MCHC: 33.7 g/dL (ref 31.5–35.7)
MCV: 96 fL (ref 79–97)
PLATELETS: 372 10*3/uL (ref 150–450)
RBC: 4.19 x10E6/uL (ref 3.77–5.28)
RDW: 14.2 % (ref 12.3–15.4)
WBC: 7.3 10*3/uL (ref 3.4–10.8)

## 2017-08-15 LAB — COMPREHENSIVE METABOLIC PANEL
A/G RATIO: 1.5 (ref 1.2–2.2)
ALBUMIN: 4.4 g/dL (ref 3.6–4.8)
ALT: 15 IU/L (ref 0–32)
AST: 27 IU/L (ref 0–40)
Alkaline Phosphatase: 39 IU/L (ref 39–117)
BUN / CREAT RATIO: 14 (ref 12–28)
BUN: 17 mg/dL (ref 8–27)
Bilirubin Total: 0.3 mg/dL (ref 0.0–1.2)
CALCIUM: 9.7 mg/dL (ref 8.7–10.3)
CO2: 20 mmol/L (ref 20–29)
CREATININE: 1.19 mg/dL — AB (ref 0.57–1.00)
Chloride: 106 mmol/L (ref 96–106)
GFR, EST AFRICAN AMERICAN: 55 mL/min/{1.73_m2} — AB (ref >59)
GFR, EST NON AFRICAN AMERICAN: 48 mL/min/{1.73_m2} — AB (ref >59)
GLOBULIN, TOTAL: 2.9 g/dL (ref 1.5–4.5)
Glucose: 91 mg/dL (ref 65–99)
POTASSIUM: 4.9 mmol/L (ref 3.5–5.2)
SODIUM: 140 mmol/L (ref 134–144)
TOTAL PROTEIN: 7.3 g/dL (ref 6.0–8.5)

## 2017-08-15 LAB — VITAMIN D 25 HYDROXY (VIT D DEFICIENCY, FRACTURES): VIT D 25 HYDROXY: 29.1 ng/mL — AB (ref 30.0–100.0)

## 2017-08-15 LAB — T4, FREE: Free T4: 1.13 ng/dL (ref 0.82–1.77)

## 2017-08-15 LAB — TSH: TSH: 2.36 u[IU]/mL (ref 0.450–4.500)

## 2017-09-10 ENCOUNTER — Ambulatory Visit (INDEPENDENT_AMBULATORY_CARE_PROVIDER_SITE_OTHER): Payer: PPO | Admitting: Nurse Practitioner

## 2017-09-10 ENCOUNTER — Encounter: Payer: Self-pay | Admitting: Nurse Practitioner

## 2017-09-10 ENCOUNTER — Telehealth: Payer: Self-pay | Admitting: Nurse Practitioner

## 2017-09-10 VITALS — BP 124/78 | HR 76 | Resp 16 | Ht 64.0 in | Wt 131.0 lb

## 2017-09-10 DIAGNOSIS — R42 Dizziness and giddiness: Secondary | ICD-10-CM

## 2017-09-10 DIAGNOSIS — F411 Generalized anxiety disorder: Secondary | ICD-10-CM | POA: Diagnosis not present

## 2017-09-10 DIAGNOSIS — I10 Essential (primary) hypertension: Secondary | ICD-10-CM

## 2017-09-10 DIAGNOSIS — J4520 Mild intermittent asthma, uncomplicated: Secondary | ICD-10-CM | POA: Diagnosis not present

## 2017-09-10 MED ORDER — TIOTROPIUM BROMIDE MONOHYDRATE 2.5 MCG/ACT IN AERS: 1.0000 | INHALATION_SPRAY | Freq: Every day | RESPIRATORY_TRACT | 0 refills | Status: DC

## 2017-09-10 MED ORDER — ALPRAZOLAM 0.25 MG PO TABS: 0.2500 mg | ORAL_TABLET | Freq: Every evening | ORAL | 2 refills | Status: DC | PRN

## 2017-09-10 MED ORDER — MECLIZINE HCL 25 MG PO TABS: 25.0000 mg | ORAL_TABLET | Freq: Two times a day (BID) | ORAL | 0 refills | Status: DC | PRN

## 2017-09-10 NOTE — Telephone Encounter (Signed)
Shari Gutierrez called and states that she forgot to ask about her lab work?

## 2017-09-10 NOTE — Progress Notes (Signed)
[facility] [place], [place] [address]  Internal MEDICINE  Office Visit Note  Patient Name: Shari Gutierrez  [id number]  [id number]  Date of Service: 09/23/2017  Chief Complaint  Patient presents with  . Asthma    general follow up  . Sinusitis  . Hypertension  . Hyperlipidemia    Trial of asmanex at her last visit. Used it two or three times and caused her to have acute symptoms in her eyes. Does have increased pressure in her eyes (not glaucoma.) after reading package insert that she should not use if she had increased risk of glaucoma. Stopped after her third dose. Continues to use rescue inhaler 2 to 3 times per day.    Asthma  She complains of cough, sputum production and wheezing. There is no shortness of breath. This is a recurrent problem. The current episode started more than 1 month ago. The problem occurs 2 to 4 times per day. The problem has been waxing and waning. Associated symptoms include dyspnea on exertion and headaches. Pertinent negatives include no chest pain, postnasal drip, rhinorrhea, sneezing or sore throat. Her symptoms are aggravated by exercise and strenuous activity. Her symptoms are alleviated by beta-agonist. She reports moderate improvement on treatment. Her symptoms are not alleviated by anxiolytic and rest. Risk factors for lung disease include smoking/tobacco exposure. Her past medical history is significant for asthma.       Current Medication: Outpatient Encounter Medications as of 09/10/2017  Medication Sig  . cholecalciferol (VITAMIN D) 1000 UNITS tablet Take 1,000 Units by mouth 2 (two) times daily.  Marland Kitchen conjugated estrogens (PREMARIN) vaginal cream Place 2.72 Applicatorfuls vaginally 2 (two) times a week.  . estradiol (ESTRACE VAGINAL) 0.1 MG/GM vaginal cream Place 0.5 Applicatorfuls vaginally 2 (two) times a week.  . fenofibrate 160 MG tablet Take 160 mg by mouth daily.  Marland Kitchen latanoprost (XALATAN) 0.005 % ophthalmic  solution INSTILL 1 DROP AT BEDTIME INTO BOTH EYES  . metoprolol tartrate (LOPRESSOR) 25 MG tablet Take 1 tablet (25 mg total) by mouth 2 (two) times daily.  . Mometasone Furoate (ASMANEX HFA) 100 MCG/ACT AERO Inhale 2 puffs into the lungs 2 (two) times daily.  Marland Kitchen PROAIR HFA 108 (90 Base) MCG/ACT inhaler USE 1-2 PUFFS 3 TIMES DAILY IF NEEDED FOR WHEEZING  . tetrahydrozoline (EYE DROPS) 0.05 % ophthalmic solution Place 1 drop daily into both eyes.   . ALPRAZolam (XANAX) 0.25 MG tablet Take 1 tablet (0.25 mg total) by mouth at bedtime as needed for anxiety.  . meclizine (ANTIVERT) 25 MG tablet Take 1 tablet (25 mg total) by mouth 2 (two) times daily as needed for dizziness.  . Tiotropium Bromide Monohydrate (SPIRIVA RESPIMAT) 2.5 MCG/ACT AERS Inhale 1 puff into the lungs daily.   No facility-administered encounter medications on file as of 09/10/2017.     Surgical History: Past Surgical History:  Procedure Laterality Date  . APPENDECTOMY  1989  . OOPHORECTOMY  1989    Medical History: Past Medical History:  Diagnosis Date  . Anxiety   . 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 Gall stones   . Hematuria   . Hyperlipidemia   . Hypertension   . Reflux   . Sinusitis   . Vitamin D deficiency     Family History: Family History  Problem Relation Age of Onset  . Kidney failure Mother   . Cancer Father        Gastric  . Breast cancer Neg Hx   . Ovarian cancer  Neg Hx   . Colon cancer Neg Hx   . Heart disease Neg Hx   . Diabetes Neg Hx     Social History   Socioeconomic History  . Marital status: Married    Spouse name: Not on file  . Number of children: Not on file  . Years of education: Not on file  . Highest education level: Not on file  Occupational History  . Not on file  Social Needs  . Financial resource strain: Not on file  . Food insecurity:    Worry: Not on file    Inability: Not on file  . Transportation needs:    Medical: Not on file    Non-medical: Not on file  Tobacco Use   . Smoking status: Current Every Day Smoker    Packs/day: 1.00    Years: 30.00    Pack years: 30.00    Types: Cigarettes    Start date: 07/28/1979  . Smokeless tobacco: Never Used  Substance and Sexual Activity  . Alcohol use: No    Alcohol/week: 0.0 standard drinks  . Drug use: No  . Sexual activity: Yes    Birth control/protection: Post-menopausal  Lifestyle  . Physical activity:    Days per week: 2 days    Minutes per session: 30 min  . Stress: Not on file  Relationships  . Social connections:    Talks on phone: Not on file    Gets together: Not on file    Attends religious service: Not on file    Active member of club or organization: Not on file    Attends meetings of clubs or organizations: Not on file    Relationship status: Not on file  . Intimate partner violence:    Fear of current or ex partner: Not on file    Emotionally abused: Not on file    Physically abused: Not on file    Forced sexual activity: Not on file  Other Topics Concern  . Not on file  Social History Narrative  . Not on file      Review of Systems  Constitutional: Negative for activity change, chills, fatigue and unexpected weight change.  HENT: Negative for congestion, postnasal drip, rhinorrhea, sneezing and sore throat.   Eyes: Negative.  Negative for redness.  Respiratory: Positive for cough, sputum production and wheezing. Negative for chest tightness and shortness of breath.        States that she is using rescue inhaler at least once per day. Most days, she will have to use it 3 or 4 tmes per day. Trial of asmanex was unsuccessful. Caused her to feel very jittery and uncomfortable.   Cardiovascular: Positive for dyspnea on exertion. Negative for chest pain and palpitations.  Gastrointestinal: Negative for abdominal pain, constipation, diarrhea, nausea and vomiting.  Endocrine: Negative for cold intolerance, heat intolerance, polydipsia, polyphagia and polyuria.  Genitourinary: Negative.   Negative for dysuria and frequency.  Musculoskeletal: Negative for arthralgias, back pain, joint swelling and neck pain.  Skin: Negative for rash.  Allergic/Immunologic: Positive for environmental allergies.  Neurological: Positive for headaches. Negative for tremors and numbness.  Hematological: Negative for adenopathy. Does not bruise/bleed easily.  Psychiatric/Behavioral: Negative for behavioral problems (Depression), sleep disturbance and suicidal ideas. The patient is nervous/anxious.     Today's Vitals   09/10/17 1136  BP: 124/78  Pulse: 76  Resp: 16  SpO2: 95%  Weight: 131 lb (59.4 kg)  Height: 5\' 4"  (1.626 m)    Physical  Exam  Constitutional: She is oriented to person, place, and time. She appears well-developed and well-nourished. No distress.  HENT:  Head: Normocephalic and atraumatic.  Right Ear: Tympanic membrane is not erythematous and not bulging.  Left Ear: Tympanic membrane is not erythematous and not bulging.  Nose: No rhinorrhea. Right sinus exhibits no maxillary sinus tenderness and no frontal sinus tenderness. Left sinus exhibits no maxillary sinus tenderness and no frontal sinus tenderness.  Mouth/Throat: No oropharyngeal exudate or posterior oropharyngeal erythema.  Eyes: Pupils are equal, round, and reactive to light. EOM are normal.  Neck: Normal range of motion. Neck supple. No JVD present. No tracheal deviation present. No thyromegaly present.  Cardiovascular: Normal rate, regular rhythm and normal heart sounds. Exam reveals no gallop and no friction rub.  No murmur heard. Pulmonary/Chest: Effort normal and breath sounds normal. No respiratory distress. She has no wheezes. She has no rales. She exhibits no tenderness.  Dry, non-productive, intermittent cough.   Abdominal: Soft. Bowel sounds are normal. There is no tenderness.  Musculoskeletal: Normal range of motion.  Lymphadenopathy:    She has cervical adenopathy.  Neurological: She is alert and  oriented to person, place, and time. No cranial nerve deficit.  Skin: Skin is warm and dry. Capillary refill takes less than 2 seconds. She is not diaphoretic.  Psychiatric: She has a normal mood and affect. Her behavior is normal. Judgment and thought content normal.  Nursing note and vitals reviewed.  Assessment/Plan:  1. Mild intermittent asthma, unspecified whether complicated D/c asmanex. Start spiriva respimat. Sample provided today. New rx sent to her pharmacy. She should continue to take rescue inhaler as needed and as prescribed  - Tiotropium Bromide Monohydrate (SPIRIVA RESPIMAT) 2.5 MCG/ACT AERS; Inhale 1 puff into the lungs daily.  Dispense: 1 Inhaler; Refill: 0  2. Essential hypertension Stable. Continue bp medication as prescribed   3. Vertigo - meclizine (ANTIVERT) 25 MG tablet; Take 1 tablet (25 mg total) by mouth 2 (two) times daily as needed for dizziness.  Dispense: 30 tablet; Refill: 0  4. Generalized anxiety disorder May use alprazolam 0.25mg  at bedtime as needed. New prescription sent to her pharmacy. - ALPRAZolam (XANAX) 0.25 MG tablet; Take 1 tablet (0.25 mg total) by mouth at bedtime as needed for anxiety.  Dispense: 30 tablet; Refill: 2 General Counseling: Brocha verbalizes understanding of the findings of todays visit and agrees with plan of treatment. I have discussed any further diagnostic evaluation that may be needed or ordered today. We also reviewed her medications today. she has been encouraged to call the office with any questions or concerns that should arise related to todays visit.   This patient was seen by [place] with Dr Lavera Guise as a part of collaborative care agreement   Meds ordered this encounter  Medications  . Tiotropium Bromide Monohydrate (SPIRIVA RESPIMAT) 2.5 MCG/ACT AERS    Sig: Inhale 1 puff into the lungs daily.    Dispense:  1 Inhaler    Refill:  0    Samples provided today    Order Specific  Question:   Supervising Provider    Answer:   Lavera Guise [[id number]]  . ALPRAZolam (XANAX) 0.25 MG tablet    Sig: Take 1 tablet (0.25 mg total) by mouth at bedtime as needed for anxiety.    Dispense:  30 tablet    Refill:  2    Order Specific Question:   Supervising Provider    Answer:   Humphrey Rolls,  Maddock M [[id number]]  . meclizine (ANTIVERT) 25 MG tablet    Sig: Take 1 tablet (25 mg total) by mouth 2 (two) times daily as needed for dizziness.    Dispense:  30 tablet    Refill:  0    Order Specific Question:   Supervising Provider    Answer:   Lavera Guise [[id number]]    Time spent: 71 Minutes    Dr Lavera Guise Internal medicine

## 2017-09-11 NOTE — Telephone Encounter (Signed)
Please let her know that , overall, her labs looked good. She definitely needs to drink a little more water every day. Otherwise, normal. Thanks.

## 2017-09-11 NOTE — Telephone Encounter (Signed)
Left message for patient to return call regarding labs.

## 2017-09-12 ENCOUNTER — Telehealth: Payer: Self-pay

## 2017-09-12 NOTE — Telephone Encounter (Signed)
Pt was notified that her labs looked good overall but she should be drinking more water every day per message [facility] sent Jody.

## 2017-09-23 DIAGNOSIS — R42 Dizziness and giddiness: Secondary | ICD-10-CM | POA: Insufficient documentation

## 2017-09-23 DIAGNOSIS — F411 Generalized anxiety disorder: Secondary | ICD-10-CM | POA: Insufficient documentation

## 2017-09-24 ENCOUNTER — Telehealth: Payer: Self-pay

## 2017-09-24 ENCOUNTER — Other Ambulatory Visit: Payer: Self-pay | Admitting: Nurse Practitioner

## 2017-09-24 DIAGNOSIS — J0100 Acute maxillary sinusitis, unspecified: Secondary | ICD-10-CM

## 2017-09-24 MED ORDER — AMOXICILLIN 875 MG PO TABS: 875.0000 mg | ORAL_TABLET | Freq: Two times a day (BID) | ORAL | 0 refills | Status: DC

## 2017-09-24 NOTE — Telephone Encounter (Signed)
Pt advised we send antibiotic to phar  For sinus infection

## 2017-09-24 NOTE — Telephone Encounter (Signed)
For sinusitis, sent rx for amoxicillin 875mg  twice daily for 10 days. Use otc medications to treat symptoms.

## 2017-09-24 NOTE — Progress Notes (Signed)
For sinusitis, sent rx for amoxicillin 875mg  twice daily for 10 days. Use otc medications to treat symptoms.

## 2017-10-04 ENCOUNTER — Other Ambulatory Visit: Payer: Self-pay | Admitting: Nurse Practitioner

## 2017-10-04 ENCOUNTER — Telehealth: Payer: Self-pay | Admitting: Nurse Practitioner

## 2017-10-04 DIAGNOSIS — J0191 Acute recurrent sinusitis, unspecified: Secondary | ICD-10-CM

## 2017-10-04 MED ORDER — SULFAMETHOXAZOLE-TRIMETHOPRIM 800-160 MG PO TABS: 1.0000 | ORAL_TABLET | Freq: Two times a day (BID) | ORAL | 0 refills | Status: DC

## 2017-10-04 MED ORDER — PREDNISONE 10 MG (21) PO TBPK: ORAL_TABLET | ORAL | 0 refills | Status: DC

## 2017-10-04 NOTE — Telephone Encounter (Signed)
Will try round of bactrim DS bid for 10 days and prednisone taper for 6 days. Both were sent to her pharmacy. She should use OTC medications as well to help control congestion.

## 2017-10-04 NOTE — Progress Notes (Signed)
Will try round of bactrim DS bid for 10 days and prednisone taper for 6 days. Both were sent to her pharmacy. She should use OTC medications as well to help control congestion.

## 2017-10-10 DIAGNOSIS — H40153 Residual stage of open-angle glaucoma, bilateral: Secondary | ICD-10-CM | POA: Diagnosis not present

## 2017-10-12 ENCOUNTER — Ambulatory Visit: Payer: PPO | Admitting: Nurse Practitioner

## 2017-10-15 ENCOUNTER — Ambulatory Visit (INDEPENDENT_AMBULATORY_CARE_PROVIDER_SITE_OTHER): Payer: PPO | Admitting: Nurse Practitioner

## 2017-10-15 ENCOUNTER — Encounter: Payer: Self-pay | Admitting: Nurse Practitioner

## 2017-10-15 VITALS — BP 120/80 | HR 76 | Resp 16 | Ht 65.0 in | Wt 127.0 lb

## 2017-10-15 DIAGNOSIS — R059 Cough, unspecified: Secondary | ICD-10-CM

## 2017-10-15 DIAGNOSIS — J4520 Mild intermittent asthma, uncomplicated: Secondary | ICD-10-CM

## 2017-10-15 DIAGNOSIS — K8020 Calculus of gallbladder without cholecystitis without obstruction: Secondary | ICD-10-CM | POA: Insufficient documentation

## 2017-10-15 DIAGNOSIS — R05 Cough: Secondary | ICD-10-CM | POA: Diagnosis not present

## 2017-10-15 DIAGNOSIS — N289 Disorder of kidney and ureter, unspecified: Secondary | ICD-10-CM | POA: Insufficient documentation

## 2017-10-15 NOTE — Progress Notes (Signed)
[facility] [place], [place] [address]  Internal MEDICINE  Office Visit Note  Patient Name: Shari Gutierrez  [id number]  [id number]  Date of Service: 10/15/2017  Chief Complaint  Patient presents with  . Hypertension  . Hyperlipidemia  . Follow-up    sinus  infection    The patient was started on Spiriva at her last visit. Is using this once daily. Has been able to cut back use of rescue inhaler from 3 to 4 times a day to once daily. Still has congested sounding cough. Will be productive at times, especially in the mornings. She just completed round of bactrim and prednisone for recurrent sinusitis. She is a smoker, smoking about 1 pack of cigarettes per day.  C/o intermittent right upper quadrant pain. Initially started a few years ago, but then subsided. Has recently restarted. Notices this more when active and after meals. Has had increased acid reflux type symptoms. She did have abdominal u/s and CT in 2016 when symptoms first began, and she did have gallstones noted on both tests.       Current Medication: Outpatient Encounter Medications as of 10/15/2017  Medication Sig  . ALPRAZolam (XANAX) 0.25 MG tablet Take 1 tablet (0.25 mg total) by mouth at bedtime as needed for anxiety.  Marland Kitchen amoxicillin (AMOXIL) 875 MG tablet Take 1 tablet (875 mg total) by mouth 2 (two) times daily.  . cholecalciferol (VITAMIN D) 1000 UNITS tablet Take 1,000 Units by mouth 2 (two) times daily.  Marland Kitchen conjugated estrogens (PREMARIN) vaginal cream Place 2.70 Applicatorfuls vaginally 2 (two) times a week.  . estradiol (ESTRACE VAGINAL) 0.1 MG/GM vaginal cream Place 0.5 Applicatorfuls vaginally 2 (two) times a week.  . fenofibrate 160 MG tablet Take 160 mg by mouth daily.  Marland Kitchen latanoprost (XALATAN) 0.005 % ophthalmic solution INSTILL 1 DROP AT BEDTIME INTO BOTH EYES  . meclizine (ANTIVERT) 25 MG tablet Take 1 tablet (25 mg total) by mouth 2 (two) times daily as needed for dizziness.  .  metoprolol tartrate (LOPRESSOR) 25 MG tablet Take 1 tablet (25 mg total) by mouth 2 (two) times daily.  . Mometasone Furoate (ASMANEX HFA) 100 MCG/ACT AERO Inhale 2 puffs into the lungs 2 (two) times daily.  Marland Kitchen PROAIR HFA 108 (90 Base) MCG/ACT inhaler USE 1-2 PUFFS 3 TIMES DAILY IF NEEDED FOR WHEEZING  . sulfamethoxazole-trimethoprim (BACTRIM DS,SEPTRA DS) 800-160 MG tablet Take 1 tablet by mouth 2 (two) times daily.  Marland Kitchen tetrahydrozoline (EYE DROPS) 0.05 % ophthalmic solution Place 1 drop daily into both eyes.   . Tiotropium Bromide Monohydrate (SPIRIVA RESPIMAT) 2.5 MCG/ACT AERS Inhale 1 puff into the lungs daily.  . predniSONE (STERAPRED UNI-PAK 21 TAB) 10 MG (21) TBPK tablet 6 day taper - take by mouth as directed for 6 days (Patient not taking: Reported on 10/15/2017)   No facility-administered encounter medications on file as of 10/15/2017.     Surgical History: Past Surgical History:  Procedure Laterality Date  . APPENDECTOMY  1989  . OOPHORECTOMY  1989    Medical History: Past Medical History:  Diagnosis Date  . Anxiety   . 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 Gall stones   . Hematuria   . Hyperlipidemia   . Hypertension   . Reflux   . Sinusitis   . Vitamin D deficiency     Family History: Family History  Problem Relation Age of Onset  . Kidney failure Mother   . Cancer Father  Gastric  . Breast cancer Neg Hx   . Ovarian cancer Neg Hx   . Colon cancer Neg Hx   . Heart disease Neg Hx   . Diabetes Neg Hx     Social History   Socioeconomic History  . Marital status: Married    Spouse name: Not on file  . Number of children: Not on file  . Years of education: Not on file  . Highest education level: Not on file  Occupational History  . Not on file  Social Needs  . Financial resource strain: Not on file  . Food insecurity:    Worry: Not on file    Inability: Not on file  . Transportation needs:    Medical: Not on file    Non-medical: Not on file  Tobacco Use  .  Smoking status: Current Every Day Smoker    Packs/day: 1.00    Years: 30.00    Pack years: 30.00    Types: Cigarettes    Start date: 07/28/1979  . Smokeless tobacco: Never Used  Substance and Sexual Activity  . Alcohol use: No    Alcohol/week: 0.0 standard drinks  . Drug use: No  . Sexual activity: Yes    Birth control/protection: Post-menopausal  Lifestyle  . Physical activity:    Days per week: 2 days    Minutes per session: 30 min  . Stress: Not on file  Relationships  . Social connections:    Talks on phone: Not on file    Gets together: Not on file    Attends religious service: Not on file    Active member of club or organization: Not on file    Attends meetings of clubs or organizations: Not on file    Relationship status: Not on file  . Intimate partner violence:    Fear of current or ex partner: Not on file    Emotionally abused: Not on file    Physically abused: Not on file    Forced sexual activity: Not on file  Other Topics Concern  . Not on file  Social History Narrative  . Not on file      Review of Systems  Constitutional: Negative for activity change, chills, fatigue and unexpected weight change.  HENT: Negative for congestion, postnasal drip, rhinorrhea, sneezing and sore throat.   Eyes: Negative.  Negative for redness.  Respiratory: Positive for cough and wheezing. Negative for chest tightness and shortness of breath.        States that she is using rescue inhaler at least once per day. Most days, she will have to use it 3 or 4 tmes per day. Trial of asmanex was unsuccessful. Caused her to feel very jittery and uncomfortable.   Cardiovascular: Negative for chest pain and palpitations.  Gastrointestinal: Negative for abdominal pain, constipation, diarrhea, nausea and vomiting.  Endocrine: Negative for cold intolerance, heat intolerance, polydipsia, polyphagia and polyuria.  Genitourinary: Negative.  Negative for dysuria and frequency.  Musculoskeletal:  Negative for arthralgias, back pain, joint swelling and neck pain.  Skin: Negative for rash.  Allergic/Immunologic: Positive for environmental allergies.  Neurological: Positive for headaches. Negative for tremors and numbness.  Hematological: Negative for adenopathy. Does not bruise/bleed easily.  Psychiatric/Behavioral: Negative for behavioral problems (Depression), sleep disturbance and suicidal ideas. The patient is nervous/anxious.     Today's Vitals   10/15/17 0914  BP: 120/80  Pulse: 76  Resp: 16  SpO2: 98%  Weight: 127 lb (57.6 kg)  Height: 5\' 5"  (1.651  m)   Physical Exam  Constitutional: She is oriented to person, place, and time. She appears well-developed and well-nourished. No distress.  HENT:  Head: Normocephalic and atraumatic.  Right Ear: Tympanic membrane is not erythematous and not bulging.  Left Ear: Tympanic membrane is not erythematous and not bulging.  Nose: Nose normal. No rhinorrhea. Right sinus exhibits no maxillary sinus tenderness and no frontal sinus tenderness. Left sinus exhibits no maxillary sinus tenderness and no frontal sinus tenderness.  Mouth/Throat: No oropharyngeal exudate or posterior oropharyngeal erythema.  Eyes: Pupils are equal, round, and reactive to light. Conjunctivae and EOM are normal.  Neck: Normal range of motion. Neck supple. No JVD present. No tracheal deviation present. No thyromegaly present.  Cardiovascular: Normal rate, regular rhythm and normal heart sounds. Exam reveals no gallop and no friction rub.  No murmur heard. Pulmonary/Chest: Effort normal and breath sounds normal. No respiratory distress. She has no wheezes. She has no rales. She exhibits no tenderness.  Congested, non-productive cough noted.   Abdominal: Soft. Bowel sounds are normal. There is no tenderness.  Musculoskeletal: Normal range of motion.  Lymphadenopathy:    She has no cervical adenopathy.  Neurological: She is alert and oriented to person, place, and  time. No cranial nerve deficit.  Skin: Skin is warm and dry. Capillary refill takes less than 2 seconds. She is not diaphoretic.  Psychiatric: She has a normal mood and affect. Her behavior is normal. Judgment and thought content normal.  Nursing note and vitals reviewed.  Assessment/Plan: 1. Mild intermittent asthma, unspecified whether complicated Improved. Continue spiriva every day. Use rescue inhaler as needed and as prescribed   2. Cough Persistent with history of smoking. Will get chest x-ray for further evaluation.  - DG Chest 2 View; Future  3. Gallstones Prior u/s and CT of abdomen positive for gallstones. Repeat ultrasound for further evaluation. Refer out as indicated.  - US Abdomen Limited RUQ; Future  4. Abnormal renal function Recheck bmp. - Basic Metabolic Panel (BMET)  General Counseling: Sharnise verbalizes understanding of the findings of todays visit and agrees with plan of treatment. I have discussed any further diagnostic evaluation that may be needed or ordered today. We also reviewed her medications today. she has been encouraged to call the office with any questions or concerns that should arise related to todays visit.  This patient was seen by Leretha Pol FNP Collaboration with Dr Lavera Guise as a part of collaborative care agreement  Orders Placed This Encounter  Procedures  . DG Chest 2 View  . US Abdomen Limited RUQ  . Basic Metabolic Panel (BMET)     Time spent: 94 Minutes      Dr Lavera Guise Internal medicine

## 2017-10-19 ENCOUNTER — Other Ambulatory Visit: Payer: Self-pay

## 2017-10-19 ENCOUNTER — Ambulatory Visit (INDEPENDENT_AMBULATORY_CARE_PROVIDER_SITE_OTHER): Payer: PPO

## 2017-10-19 DIAGNOSIS — K8020 Calculus of gallbladder without cholecystitis without obstruction: Secondary | ICD-10-CM

## 2017-10-22 ENCOUNTER — Ambulatory Visit: Payer: Self-pay | Admitting: Nurse Practitioner

## 2017-10-24 ENCOUNTER — Other Ambulatory Visit: Payer: Self-pay

## 2017-10-24 MED ORDER — METOPROLOL TARTRATE 25 MG PO TABS: 25.0000 mg | ORAL_TABLET | Freq: Two times a day (BID) | ORAL | 3 refills | Status: DC

## 2017-10-25 ENCOUNTER — Telehealth: Payer: Self-pay

## 2017-10-25 NOTE — Telephone Encounter (Signed)
Pt advised Ultrasound result she is going to call back tomorrow if she decided to go for surgery or hold off

## 2017-10-26 ENCOUNTER — Telehealth: Payer: Self-pay

## 2017-10-26 ENCOUNTER — Other Ambulatory Visit: Payer: Self-pay

## 2017-10-31 ENCOUNTER — Ambulatory Visit
Admission: RE | Admit: 2017-10-31 | Discharge: 2017-10-31 | Disposition: A | Discharge status: Home / Self Care | Payer: PPO | Source: Ambulatory Visit | Attending: Nurse Practitioner | Admitting: Nurse Practitioner

## 2017-10-31 ENCOUNTER — Other Ambulatory Visit: Payer: Self-pay | Admitting: Nurse Practitioner

## 2017-10-31 DIAGNOSIS — R05 Cough: Secondary | ICD-10-CM

## 2017-10-31 DIAGNOSIS — R059 Cough, unspecified: Secondary | ICD-10-CM

## 2017-10-31 DIAGNOSIS — R918 Other nonspecific abnormal finding of lung field: Secondary | ICD-10-CM | POA: Diagnosis not present

## 2017-10-31 DIAGNOSIS — N289 Disorder of kidney and ureter, unspecified: Secondary | ICD-10-CM | POA: Diagnosis not present

## 2017-11-01 LAB — BASIC METABOLIC PANEL
BUN / CREAT RATIO: 13 (ref 12–28)
BUN: 14 mg/dL (ref 8–27)
CO2: 23 mmol/L (ref 20–29)
CREATININE: 1.08 mg/dL — AB (ref 0.57–1.00)
Calcium: 9.7 mg/dL (ref 8.7–10.3)
Chloride: 104 mmol/L (ref 96–106)
GFR calc Af Amer: 61 mL/min/{1.73_m2} (ref >59)
GFR, EST NON AFRICAN AMERICAN: 53 mL/min/{1.73_m2} — AB (ref >59)
Glucose: 84 mg/dL (ref 65–99)
Potassium: 5 mmol/L (ref 3.5–5.2)
SODIUM: 139 mmol/L (ref 134–144)

## 2017-11-07 ENCOUNTER — Telehealth: Payer: Self-pay

## 2017-11-08 ENCOUNTER — Ambulatory Visit (INDEPENDENT_AMBULATORY_CARE_PROVIDER_SITE_OTHER): Payer: PPO | Admitting: Surgery

## 2017-11-08 ENCOUNTER — Encounter: Payer: Self-pay | Admitting: Surgery

## 2017-11-08 VITALS — BP 149/79 | HR 90 | Temp 97.7°F | Ht 65.0 in | Wt 128.0 lb

## 2017-11-08 DIAGNOSIS — K8020 Calculus of gallbladder without cholecystitis without obstruction: Secondary | ICD-10-CM | POA: Diagnosis not present

## 2017-11-08 NOTE — Telephone Encounter (Signed)
Pt advised chest xray showed consistently mild asthma and labs much improved

## 2017-11-08 NOTE — Addendum Note (Signed)
Addended by: Priscille Heidelberg on: 11/08/2017 11:53 AM   Modules accepted: Orders, SmartSet

## 2017-11-08 NOTE — Progress Notes (Signed)
Surgical Clinic History and Physical  Referring provider:  Ronnell Freshwater, NP [place], [place] [address]  HISTORY OF PRESENT ILLNESS (HPI):  67 y.o. female presents for evaluation of RUQ pain that she initially described as "an awareness". When asked for clarification, patient describes that it feels like a headache in her abdomen that started many years ago, but has become increasingly frequent. She also initially expressed uncertainty whether her RUQ > epigastric "awareness" occurs after eating, but later during appointment described that her RUQ > epigastric abdominal "awareness" is worst after eating mayonnaise, creamy coleslaw, and fatty foods that she enjoys and eats frequently. Patient reports mild occasional heartburn for which she's long taken Zantac, but she expresses concern regarding Zantac and requested information regarding alternate medication. Patient otherwise says she is trying to quit smoking and denies fever/chills, N/V, diarrhea, CP, or SOB.  PAST MEDICAL HISTORY (PMH):  Past Medical History:  Diagnosis Date  . Anxiety   . 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 Gall stones   . Hematuria   . Hyperlipidemia   . Hypertension   . Reflux   . Sinusitis   . Vitamin D deficiency      PAST SURGICAL HISTORY ([place]):  Past Surgical History:  Procedure Laterality Date  . APPENDECTOMY  1989  . COLONOSCOPY  2016  . OOPHORECTOMY  1989     MEDICATIONS:  Prior to Admission medications   Medication Sig Start Date End Date Taking? Authorizing Provider  ALPRAZolam (XANAX) 0.25 MG tablet Take 1 tablet (0.25 mg total) by mouth at bedtime as needed for anxiety. 09/10/17  Yes Boscia, Greer Ee, NP  cholecalciferol (VITAMIN D) 1000 UNITS tablet Take 1,000 Units by mouth 2 (two) times daily.   Yes [provider]  conjugated estrogens (PREMARIN) vaginal cream Place 1.22 Applicatorfuls vaginally 2 (two) times a week. 08/09/17  Yes Defrancesco, Alanda Slim, MD  fenofibrate 160 MG  tablet Take 160 mg by mouth daily.   Yes [provider]  latanoprost (XALATAN) 0.005 % ophthalmic solution INSTILL 1 DROP AT BEDTIME INTO BOTH EYES 05/15/17  Yes [provider]  meclizine (ANTIVERT) 25 MG tablet Take 1 tablet (25 mg total) by mouth 2 (two) times daily as needed for dizziness. 09/10/17  Yes Boscia, Greer Ee, NP  metoprolol tartrate (LOPRESSOR) 25 MG tablet Take 1 tablet (25 mg total) by mouth 2 (two) times daily. 10/24/17  Yes Boscia, Greer Ee, NP  PROAIR HFA 108 (90 Base) MCG/ACT inhaler USE 1-2 PUFFS 3 TIMES DAILY IF NEEDED FOR WHEEZING 06/01/17  Yes Boscia, Heather E, NP  tetrahydrozoline (EYE DROPS) 0.05 % ophthalmic solution Place 1 drop daily into both eyes.    Yes [provider]     ALLERGIES:  No Known Allergies   SOCIAL HISTORY:  Social History   Socioeconomic History  . Marital status: Married    Spouse name: Not on file  . Number of children: Not on file  . Years of education: Not on file  . Highest education level: Not on file  Occupational History  . Not on file  Social Needs  . Financial resource strain: Not on file  . Food insecurity:    Worry: Not on file    Inability: Not on file  . Transportation needs:    Medical: Not on file    Non-medical: Not on file  Tobacco Use  . Smoking status: Current Every Day Smoker    Packs/day: 1.00    Years: 30.00  Pack years: 30.00    Types: Cigarettes    Start date: 07/28/1979  . Smokeless tobacco: Never Used  Substance and Sexual Activity  . Alcohol use: Yes    Alcohol/week: 0.0 standard drinks    Comment: occasionally 1/2 beer  . Drug use: No  . Sexual activity: Yes    Birth control/protection: Post-menopausal  Lifestyle  . Physical activity:    Days per week: 2 days    Minutes per session: 30 min  . Stress: Not on file  Relationships  . Social connections:    Talks on phone: Not on file    Gets together: Not on file    Attends religious service: Not on file     Active member of club or organization: Not on file    Attends meetings of clubs or organizations: Not on file    Relationship status: Not on file  . Intimate partner violence:    Fear of current or ex partner: Not on file    Emotionally abused: Not on file    Physically abused: Not on file    Forced sexual activity: Not on file  Other Topics Concern  . Not on file  Social History Narrative  . Not on file    The patient currently resides (home / rehab facility / nursing home): Home The patient normally is (ambulatory / bedbound): Ambulatory  FAMILY HISTORY:  Family History  Problem Relation Age of Onset  . Kidney failure Mother   . Cancer Father        Gastric  . Breast cancer Neg Hx   . Ovarian cancer Neg Hx   . Colon cancer Neg Hx   . Heart disease Neg Hx   . Diabetes Neg Hx     Otherwise negative/non-contributory.  REVIEW OF SYSTEMS:  Constitutional: denies any other weight loss, fever, chills, or sweats  Eyes: denies any other vision changes, history of eye injury  ENT: denies sore throat, hearing problems  Respiratory: denies shortness of breath, wheezing  Cardiovascular: denies chest pain, palpitations  Gastrointestinal: abdominal pain, N/V, and bowel function as per HPI Musculoskeletal: denies any other joint pains or cramps  Skin: Denies any other rashes or skin discolorations except as per HPI Neurological: denies any other headache, dizziness, weakness  Psychiatric: Denies any other depression, anxiety   All other review of systems were otherwise negative   VITAL SIGNS:  BP (!) 149/79   Pulse 90   Temp 97.7 F (36.5 C) (Skin)   Ht 5\' 5"  (1.651 m)   Wt 128 lb (58.1 kg)   BMI 21.30 kg/m   PHYSICAL EXAM:  Constitutional:  -- Normal body habitus  -- Awake, alert, and oriented x3  Eyes:  -- Pupils equally round and reactive to light  -- No scleral icterus  Ear, nose, throat:  -- No jugular venous distension -- No nasal drainage, bleeding Pulmonary:   -- No crackles  -- Equal breath sounds bilaterally -- Breathing non-labored at rest Cardiovascular:  -- S1, S2 present  -- No pericardial rubs  Gastrointestinal:  -- Abdomen soft and non-distended with currently mild RUQ abdominal tenderness to palpation, no guarding/rebound  -- No abdominal masses appreciated, pulsatile or otherwise  Musculoskeletal and Integumentary:  -- Wounds or skin discoloration: None appreciated -- Extremities: B/L UE and LE FROM, hands and feet warm, no edema  Neurologic:  -- Motor function: Intact and symmetric -- Sensation: Intact and symmetric  Labs:  CBC Latest Ref Rng & Units 08/14/2017  WBC 3.4 - 10.8 x10E3/uL 7.3  Hemoglobin 11.1 - 15.9 g/dL 13.5  Hematocrit 34.0 - 46.6 % 40.1  Platelets 150 - 450 x10E3/uL 372   CMP Latest Ref Rng & Units 10/31/2017 08/14/2017 04/30/2015  Glucose 65 - 99 mg/dL 84 91 -  BUN 8 - 27 mg/dL 14 17 -  Creatinine 0.57 - 1.00 mg/dL 1.08(H) 1.19(H) 1.00  Sodium 134 - 144 mmol/L 139 140 -  Potassium 3.5 - 5.2 mmol/L 5.0 4.9 -  Chloride 96 - 106 mmol/L 104 106 -  CO2 20 - 29 mmol/L 23 20 -  Calcium 8.7 - 10.3 mg/dL 9.7 9.7 -  Total Protein 6.0 - 8.5 g/dL - 7.3 -  Total Bilirubin 0.0 - 1.2 mg/dL - 0.3 -  Alkaline Phos 39 - 117 IU/L - 39 -  AST 0 - 40 IU/L - 27 -  ALT 0 - 32 IU/L - 15 -   Imaging studies:  Limited RUQ Abdominal Ultrasound ([facility], 10/19/2017) Cholelithiasis without sonographic evidence of cholecystitis. Largest gallstone measures 1.3 cm, and no intrahepatic or extrahepatic biliary ductal dilatation is evident.  Assessment/Plan:  67 y.o. female with what sounds most likely to be increasingly symptomatic cholelithiasis, complicated by co-morbidities including HTN, HLD, asthma/COPD, GERD, and chronic ongoing tobacco abuse (smoking).              - avoid/minimize foods with higher fat content (meats, cheeses/dairy, and fried)             - prefer low-fat vegetables, whole grains (wheat bread,  ceareals, etc), and fruits until cholecystectomy              - all risks, benefits, and alternatives to cholecystectomy were discussed with the patient and her husband, all of their questions were answered to their expressed satisfaction, and informed consent was obtained; though encouraged by her husband to schedule surgery, patient requests a few days to think about timing/scheduling.             - anticipate laparoscopic cholecystectomy with timing to be determined per patient             - anticipate return to clinic 2 weeks after above planned surgery             - instructed to call if any questions or concerns  - smoking cessation encouraged  All of the above recommendations were discussed with the patient and patient's husband, and all of patient's and family's questions were answered to their expressed satisfaction.  Thank you for the opportunity to participate in this patient's care.  -- Marilynne Drivers Rosana Hoes, MD, [place]: [facility] - Partnering for exceptional care. Office: [phone]

## 2017-11-08 NOTE — Patient Instructions (Addendum)
Please call our office if you decide to move forward with surgery.   Cholelithiasis Cholelithiasis is also called "gallstones." It is a kind of gallbladder disease. The gallbladder is an organ that stores a liquid (bile) that helps you digest fat. Gallstones may not cause symptoms (may be silent gallstones) until they cause a blockage, and then they can cause pain (gallbladder attack). Follow these instructions at home:  Take over-the-counter and prescription medicines only as told by your doctor.  Stay at a healthy weight.  Eat healthy foods. This includes: ? Eating fewer fatty foods, like fried foods. ? Eating fewer refined carbs (refined carbohydrates). Refined carbs are breads and grains that are highly processed, like white bread and white rice. Instead, choose whole grains like whole-wheat bread and brown rice. ? Eating more fiber. Almonds, fresh fruit, and beans are healthy sources of fiber.  Keep all follow-up visits as told by your doctor. This is important. Contact a doctor if:  You have sudden pain in the upper right side of your belly (abdomen). Pain might spread to your right shoulder or your chest. This may be a sign of a gallbladder attack.  You feel sick to your stomach (are nauseous).  You throw up (vomit).  You have been diagnosed with gallstones that have no symptoms and you get: ? Belly pain. ? Discomfort, burning, or fullness in the upper part of your belly (indigestion). Get help right away if:  You have sudden pain in the upper right side of your belly, and it lasts for more than 2 hours.  You have belly pain that lasts for more than 5 hours.  You have a fever or chills.  You keep feeling sick to your stomach or you keep throwing up.  Your skin or the whites of your eyes turn yellow (jaundice).  You have dark-colored pee (urine).  You have light-colored poop (stool). Summary  Cholelithiasis is also called "gallstones."  The gallbladder is an organ  that stores a liquid (bile) that helps you digest fat.  Silent gallstones are gallstones that do not cause symptoms.  A gallbladder attack may cause sudden pain in the upper right side of your belly. Pain might spread to your right shoulder or your chest. If this happens, contact your doctor.  If you have sudden pain in the upper right side of your belly that lasts for more than 2 hours, get help right away. This information is not intended to replace advice given to you by your health care provider. Make sure you discuss any questions you have with your health care provider. Document Released: 07/05/2007 Document Revised: 10/03/2015 Document Reviewed: 10/03/2015 Elsevier Interactive Patient Education  2017 [employer].

## 2017-11-26 ENCOUNTER — Ambulatory Visit: Payer: Self-pay | Admitting: Nurse Practitioner

## 2017-12-07 ENCOUNTER — Ambulatory Visit (INDEPENDENT_AMBULATORY_CARE_PROVIDER_SITE_OTHER): Payer: PPO | Admitting: Nurse Practitioner

## 2017-12-07 ENCOUNTER — Encounter: Payer: Self-pay | Admitting: Nurse Practitioner

## 2017-12-07 VITALS — BP 149/62 | HR 72 | Resp 16 | Ht 65.0 in | Wt 130.4 lb

## 2017-12-07 DIAGNOSIS — J4520 Mild intermittent asthma, uncomplicated: Secondary | ICD-10-CM | POA: Diagnosis not present

## 2017-12-07 DIAGNOSIS — R05 Cough: Secondary | ICD-10-CM | POA: Diagnosis not present

## 2017-12-07 DIAGNOSIS — R059 Cough, unspecified: Secondary | ICD-10-CM

## 2017-12-07 DIAGNOSIS — F17210 Nicotine dependence, cigarettes, uncomplicated: Secondary | ICD-10-CM

## 2017-12-07 MED ORDER — TIOTROPIUM BROMIDE MONOHYDRATE 2.5 MCG/ACT IN AERS: 1.0000 | INHALATION_SPRAY | Freq: Every day | RESPIRATORY_TRACT | 5 refills | Status: DC

## 2017-12-07 NOTE — Progress Notes (Signed)
[facility] [place],  [id number]  Internal MEDICINE  Office Visit Note  Patient Name: Shari Gutierrez  [id number]  [id number]  Date of Service: 12/07/2017  Chief Complaint  Patient presents with  . Medical Management of Chronic Issues    6wk follow up, gallstones  . Asthma    The patient was started on Spiriva at her last visit. Is using this once daily. Has been able to cut back use of rescue inhaler from 3 to 4 times a day to once daily. Chest x-ray does indicate chronic hyperinflation of the lungs but no other acute abnormalities. She is a smoker, smoking about 1 pack of cigarettes per day. She has started using a nicotine patch. Is now smoking a few cigarettes in the morning and a few in the afternoon. Has cut down significantly. She would like to see pulmonology for further evaluation of this condition.        Current Medication: Outpatient Encounter Medications as of 12/07/2017  Medication Sig  . ALPRAZolam (XANAX) 0.25 MG tablet Take 1 tablet (0.25 mg total) by mouth at bedtime as needed for anxiety.  . cholecalciferol (VITAMIN D) 1000 UNITS tablet Take 1,000 Units by mouth 2 (two) times daily.  Marland Kitchen conjugated estrogens (PREMARIN) vaginal cream Place 8.36 Applicatorfuls vaginally 2 (two) times a week.  . fenofibrate 160 MG tablet Take 160 mg by mouth daily.  Marland Kitchen latanoprost (XALATAN) 0.005 % ophthalmic solution INSTILL 1 DROP AT BEDTIME INTO BOTH EYES  . meclizine (ANTIVERT) 25 MG tablet Take 1 tablet (25 mg total) by mouth 2 (two) times daily as needed for dizziness.  . metoprolol tartrate (LOPRESSOR) 25 MG tablet Take 1 tablet (25 mg total) by mouth 2 (two) times daily.  Marland Kitchen PROAIR HFA 108 (90 Base) MCG/ACT inhaler USE 1-2 PUFFS 3 TIMES DAILY IF NEEDED FOR WHEEZING  . tetrahydrozoline (EYE DROPS) 0.05 % ophthalmic solution Place 1 drop daily into both eyes.   . Tiotropium Bromide Monohydrate (SPIRIVA RESPIMAT) 2.5 MCG/ACT AERS Inhale 1 Inhaler  into the lungs daily.   No facility-administered encounter medications on file as of 12/07/2017.     Surgical History: Past Surgical History:  Procedure Laterality Date  . APPENDECTOMY  1989  . COLONOSCOPY  2016  . OOPHORECTOMY  1989    Medical History: Past Medical History:  Diagnosis Date  . Anxiety   . 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 Gall stones   . Hematuria   . Hyperlipidemia   . Hypertension   . Reflux   . Sinusitis   . Vitamin D deficiency     Family History: Family History  Problem Relation Age of Onset  . Kidney failure Mother   . Cancer Father        Gastric  . Breast cancer Neg Hx   . Ovarian cancer Neg Hx   . Colon cancer Neg Hx   . Heart disease Neg Hx   . Diabetes Neg Hx     Social History   Socioeconomic History  . Marital status: Married    Spouse name: Not on file  . Number of children: Not on file  . Years of education: Not on file  . Highest education level: Not on file  Occupational History  . Not on file  Social Needs  . Financial resource strain: Not on file  . Food insecurity:    Worry: Not on file    Inability: Not on file  . Transportation needs:  Medical: Not on file    Non-medical: Not on file  Tobacco Use  . Smoking status: Current Every Day Smoker    Packs/day: 1.00    Years: 30.00    Pack years: 30.00    Types: Cigarettes    Start date: 07/28/1979  . Smokeless tobacco: Never Used  . Tobacco comment: pt is using a patch to help quit  Substance and Sexual Activity  . Alcohol use: Yes    Alcohol/week: 0.0 standard drinks    Comment: occasionally 1/2 beer  . Drug use: No  . Sexual activity: Yes    Birth control/protection: Post-menopausal  Lifestyle  . Physical activity:    Days per week: 2 days    Minutes per session: 30 min  . Stress: Not on file  Relationships  . Social connections:    Talks on phone: Not on file    Gets together: Not on file    Attends religious service: Not on file    Active member of club or  organization: Not on file    Attends meetings of clubs or organizations: Not on file    Relationship status: Not on file  . Intimate partner violence:    Fear of current or ex partner: Not on file    Emotionally abused: Not on file    Physically abused: Not on file    Forced sexual activity: Not on file  Other Topics Concern  . Not on file  Social History Narrative  . Not on file      Review of Systems  Constitutional: Negative for activity change, chills, fatigue and unexpected weight change.  HENT: Negative for congestion, postnasal drip, rhinorrhea, sneezing and sore throat.   Eyes: Negative.  Negative for redness.  Respiratory: Negative for cough, chest tightness, shortness of breath and wheezing.        Using spiriva respimat once daily. Generally using her rescue inhaler once daily when needed.   Cardiovascular: Negative for chest pain and palpitations.  Gastrointestinal: Negative for abdominal pain, constipation, diarrhea, nausea and vomiting.  Endocrine: Negative for cold intolerance, heat intolerance, polydipsia, polyphagia and polyuria.  Genitourinary: Negative.  Negative for dysuria and frequency.  Musculoskeletal: Negative for arthralgias, back pain, joint swelling and neck pain.  Skin: Negative for rash.  Allergic/Immunologic: Positive for environmental allergies.  Neurological: Negative for tremors, numbness and headaches.  Hematological: Negative for adenopathy. Does not bruise/bleed easily.  Psychiatric/Behavioral: Negative for behavioral problems (Depression), sleep disturbance and suicidal ideas. The patient is nervous/anxious.     Today's Vitals   12/07/17 0935  BP: (!) 149/62  Pulse: 72  Resp: 16  SpO2: 98%  Weight: 130 lb 6.4 oz (59.1 kg)  Height: 5\' 5"  (1.651 m)     Physical Exam  Constitutional: She is oriented to person, place, and time. She appears well-developed and well-nourished. No distress.  HENT:  Head: Normocephalic and atraumatic.   Right Ear: Tympanic membrane is not erythematous and not bulging.  Left Ear: Tympanic membrane is not erythematous and not bulging.  Nose: Nose normal. No rhinorrhea. Right sinus exhibits no maxillary sinus tenderness and no frontal sinus tenderness. Left sinus exhibits no maxillary sinus tenderness and no frontal sinus tenderness.  Mouth/Throat: No oropharyngeal exudate or posterior oropharyngeal erythema.  Eyes: Pupils are equal, round, and reactive to light. Conjunctivae and EOM are normal.  Neck: Normal range of motion. Neck supple. No JVD present. No tracheal deviation present. No thyromegaly present.  Cardiovascular: Normal rate, regular rhythm and normal  heart sounds. Exam reveals no gallop and no friction rub.  No murmur heard. Pulmonary/Chest: Effort normal and breath sounds normal. No respiratory distress. She has no wheezes. She has no rales. She exhibits no tenderness.  Abdominal: Soft. Bowel sounds are normal. There is no tenderness.  Musculoskeletal: Normal range of motion.  Lymphadenopathy:    She has no cervical adenopathy.  Neurological: She is alert and oriented to person, place, and time. No cranial nerve deficit.  Skin: Skin is warm and dry. Capillary refill takes less than 2 seconds. She is not diaphoretic.  Psychiatric: She has a normal mood and affect. Her behavior is normal. Judgment and thought content normal.  Nursing note and vitals reviewed.  Assessment/Plan: 1. Mild intermittent asthma, unspecified whether complicated Continue to use Spiriva Respimat every day and rescue inhaler as needed and as prescribed. Will get PFT scheduled and refer to Dr. Devona Konig for further evaluation of asthma.  - Ambulatory referral to Pulmonology - Pulmonary function test; Future - Tiotropium Bromide Monohydrate (SPIRIVA RESPIMAT) 2.5 MCG/ACT AERS; Inhale 1 Inhaler into the lungs daily.  Dispense: 1 Inhaler; Refill: 5  2. Cough Improving. Continue to use spiriva every day and  rescue inhaler as needed and as prescribed  3. Nicotine dependence, cigarettes, uncomplicated Continue to use nicotine patch to help with smoking cessation.  General Counseling: Binnie verbalizes understanding of the findings of todays visit and agrees with plan of treatment. I have discussed any further diagnostic evaluation that may be needed or ordered today. We also reviewed her medications today. she has been encouraged to call the office with any questions or concerns that should arise related to todays visit.  Smoking cessation counseling: 1. Pt acknowledges the risks of long term smoking, she will try to quite smoking. 2. Options for different medications including nicotine products, chewing gum, patch etc, Wellbutrin and Chantix is discussed 3. Goal and date of compete cessation is discussed 4. Total time spent in smoking cessation is 10 min.   Orders Placed This Encounter  Procedures  . Ambulatory referral to Pulmonology  . Pulmonary function test    Meds ordered this encounter  Medications  . Tiotropium Bromide Monohydrate (SPIRIVA RESPIMAT) 2.5 MCG/ACT AERS    Sig: Inhale 1 Inhaler into the lungs daily.    Dispense:  1 Inhaler    Refill:  5    Samples provided    Order Specific Question:   Supervising Provider    Answer:   Lavera Guise [[id number]]    Time spent: 25 Minutes      Dr Lavera Guise Internal medicine

## 2017-12-24 ENCOUNTER — Other Ambulatory Visit: Payer: Self-pay | Admitting: Nurse Practitioner

## 2017-12-24 ENCOUNTER — Telehealth: Payer: Self-pay

## 2017-12-24 DIAGNOSIS — J0191 Acute recurrent sinusitis, unspecified: Secondary | ICD-10-CM

## 2017-12-24 MED ORDER — CEFUROXIME AXETIL 500 MG PO TABS: 500.0000 mg | ORAL_TABLET | Freq: Two times a day (BID) | ORAL | 0 refills | Status: DC

## 2017-12-24 NOTE — Progress Notes (Signed)
Patient c/o of sinus type infection. Start ceftin 500mg  twice daily for 10 days. Use OTC medication to alleviate symptoms.

## 2017-12-24 NOTE — Telephone Encounter (Signed)
Pt advised that we send antibiotic this need to been seen if symptoms worsed

## 2017-12-24 NOTE — Telephone Encounter (Signed)
Patient c/o of sinus type infection. Start ceftin 500mg  twice daily for 10 days. Use OTC medication to alleviate symptoms.

## 2017-12-26 ENCOUNTER — Ambulatory Visit: Payer: Self-pay | Admitting: Internal Medicine

## 2018-01-08 ENCOUNTER — Other Ambulatory Visit: Payer: Self-pay

## 2018-01-08 MED ORDER — FENOFIBRATE 160 MG PO TABS: 160.0000 mg | ORAL_TABLET | Freq: Every day | ORAL | 1 refills | Status: DC

## 2018-01-09 ENCOUNTER — Ambulatory Visit: Payer: PPO | Admitting: Internal Medicine

## 2018-01-09 DIAGNOSIS — R0602 Shortness of breath: Secondary | ICD-10-CM | POA: Diagnosis not present

## 2018-01-09 LAB — PULMONARY FUNCTION TEST

## 2018-01-12 NOTE — Procedures (Signed)
[place], [id number]  DATE OF SERVICE: January 09, 2018  Complete Pulmonary Function Testing Interpretation:  FINDINGS:  Forced vital capacity is 2.36 L which is 79% of predicted and is mildly decreased.  The FEV1 is 1.48 L which is 63% of predicted and is moderately decreased.  FEV1 FVC ratio is moderately decreased.  Postbronchodilator there does not appear to be significant improvement in the FEV1 however clinical improvement may still occur in the absence of spirometric improvement.  Total lung capacity is normal residual volume is normal residual volume total lung capacity ratio is increased FRC is increased.  The DLCO is moderately decreased and normal when corrected for alveolar volume.  IMPRESSION:  This pulmonary function study is consistent with moderate obstructive lung disease.  There does not appear to be response to bronchodilators however clinical improvement may occur in the absence of spirometric improvement and does not preclude the use of bronchodilators.  Allyne Gee, MD [facility] Pulmonary Critical Care Medicine Sleep Medicine

## 2018-01-17 ENCOUNTER — Ambulatory Visit (INDEPENDENT_AMBULATORY_CARE_PROVIDER_SITE_OTHER): Payer: PPO | Admitting: Internal Medicine

## 2018-01-17 ENCOUNTER — Encounter: Payer: Self-pay | Admitting: Internal Medicine

## 2018-01-17 VITALS — BP 116/66 | HR 91 | Resp 16 | Ht 65.0 in | Wt 129.0 lb

## 2018-01-17 DIAGNOSIS — F17210 Nicotine dependence, cigarettes, uncomplicated: Secondary | ICD-10-CM

## 2018-01-17 DIAGNOSIS — J4520 Mild intermittent asthma, uncomplicated: Secondary | ICD-10-CM

## 2018-01-17 DIAGNOSIS — J449 Chronic obstructive pulmonary disease, unspecified: Secondary | ICD-10-CM

## 2018-01-17 DIAGNOSIS — I10 Essential (primary) hypertension: Secondary | ICD-10-CM

## 2018-01-17 NOTE — Progress Notes (Signed)
[facility] [place], [place] [address]  Pulmonary Sleep Medicine   Office Visit Note  Patient Name: Shari Gutierrez DOB: 01-Dec-1950 MRN [id number]  Date of Service: 01/17/2018  Complaints/HPI: Pt is here to establish care for pulmonary.  She reports she is using her albuterol  Inhaler "to much".  Patient reports that she is using Symbicort inhaler intermediately.  She restates that she is requiring her pro-air inhaler 5-6 times a day.  We discussed using Symbicort daily.  Patient states she did not want to have to use an inhaler daily, I explained that if she used her Symbicort daily she would likely not need her pro-air inhaler daily.  She verbalized understanding at this time.  She reports that she has dyspnea on exertion and shortness of breath that is intermittent.  ROS  General: (-) fever, (-) chills, (-) night sweats, (-) weakness Skin: (-) rashes, (-) itching,. Eyes: (-) visual changes, (-) redness, (-) itching. Nose and Sinuses: (-) nasal stuffiness or itchiness, (-) postnasal drip, (-) nosebleeds, (-) sinus trouble. Mouth and Throat: (-) sore throat, (-) hoarseness. Neck: (-) swollen glands, (-) enlarged thyroid, (-) neck pain. Respiratory: + cough, (-) bloody sputum, - shortness of breath, - wheezing. Cardiovascular: - ankle swelling, (-) chest pain. Lymphatic: (-) lymph node enlargement. Neurologic: (-) numbness, (-) tingling. Psychiatric: (-) anxiety, (-) depression   Current Medication: Outpatient Encounter Medications as of 01/17/2018  Medication Sig  . ALPRAZolam (XANAX) 0.25 MG tablet Take 1 tablet (0.25 mg total) by mouth at bedtime as needed for anxiety.  . cefUROXime (CEFTIN) 500 MG tablet Take 1 tablet (500 mg total) by mouth 2 (two) times daily with a meal.  . cholecalciferol (VITAMIN D) 1000 UNITS tablet Take 1,000 Units by mouth 2 (two) times daily.  Marland Kitchen conjugated estrogens (PREMARIN) vaginal cream Place 8.36 Applicatorfuls  vaginally 2 (two) times a week.  . fenofibrate 160 MG tablet Take 1 tablet (160 mg total) by mouth daily.  Marland Kitchen latanoprost (XALATAN) 0.005 % ophthalmic solution INSTILL 1 DROP AT BEDTIME INTO BOTH EYES  . meclizine (ANTIVERT) 25 MG tablet Take 1 tablet (25 mg total) by mouth 2 (two) times daily as needed for dizziness.  . metoprolol tartrate (LOPRESSOR) 25 MG tablet Take 1 tablet (25 mg total) by mouth 2 (two) times daily.  Marland Kitchen PROAIR HFA 108 (90 Base) MCG/ACT inhaler USE 1-2 PUFFS 3 TIMES DAILY IF NEEDED FOR WHEEZING  . tetrahydrozoline (EYE DROPS) 0.05 % ophthalmic solution Place 1 drop daily into both eyes.   . Tiotropium Bromide Monohydrate (SPIRIVA RESPIMAT) 2.5 MCG/ACT AERS Inhale 1 Inhaler into the lungs daily.   No facility-administered encounter medications on file as of 01/17/2018.     Surgical History: Past Surgical History:  Procedure Laterality Date  . APPENDECTOMY  1989  . COLONOSCOPY  2016  . OOPHORECTOMY  1989    Medical History: Past Medical History:  Diagnosis Date  . Anxiety   . 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 Gall stones   . Hematuria   . Hyperlipidemia   . Hypertension   . Reflux   . Sinusitis   . Vitamin D deficiency     Family History: Family History  Problem Relation Age of Onset  . Kidney failure Mother   . Cancer Father        Gastric  . Breast cancer Neg Hx   . Ovarian cancer Neg Hx   . Colon cancer Neg Hx   . Heart disease Neg Hx   .  Diabetes Neg Hx     Social History: Social History   Socioeconomic History  . Marital status: Married    Spouse name: Not on file  . Number of children: Not on file  . Years of education: Not on file  . Highest education level: Not on file  Occupational History  . Not on file  Social Needs  . Financial resource strain: Not on file  . Food insecurity:    Worry: Not on file    Inability: Not on file  . Transportation needs:    Medical: Not on file    Non-medical: Not on file  Tobacco Use  . Smoking status:  Current Every Day Smoker    Packs/day: 1.00    Years: 30.00    Pack years: 30.00    Types: Cigarettes    Start date: 07/28/1979  . Smokeless tobacco: Never Used  . Tobacco comment: pt is using a patch to help quit  Substance and Sexual Activity  . Alcohol use: Yes    Alcohol/week: 0.0 standard drinks    Comment: occasionally 1/2 beer  . Drug use: No  . Sexual activity: Yes    Birth control/protection: Post-menopausal  Lifestyle  . Physical activity:    Days per week: 2 days    Minutes per session: 30 min  . Stress: Not on file  Relationships  . Social connections:    Talks on phone: Not on file    Gets together: Not on file    Attends religious service: Not on file    Active member of club or organization: Not on file    Attends meetings of clubs or organizations: Not on file    Relationship status: Not on file  . Intimate partner violence:    Fear of current or ex partner: Not on file    Emotionally abused: Not on file    Physically abused: Not on file    Forced sexual activity: Not on file  Other Topics Concern  . Not on file  Social History Narrative  . Not on file    Vital Signs: Blood pressure 116/66, pulse 91, resp. rate 16, height 5\' 5"  (1.651 m), weight 129 lb (58.5 kg), SpO2 94 %.  Examination: General Appearance: The patient is well-developed, well-nourished, and in no distress. Skin: Gross inspection of skin unremarkable. Head: normocephalic, no gross deformities. Eyes: no gross deformities noted. ENT: ears appear grossly normal no exudates. Neck: Supple. No thyromegaly. No LAD. Respiratory: Clear breath sounds bilaterally. Cardiovascular: Normal S1 and S2 without murmur or rub. Extremities: No cyanosis. pulses are equal. Neurologic: Alert and oriented. No involuntary movements.  LABS: Recent Results (from the past 2160 hour(s))  Basic metabolic panel     Status: Abnormal   Collection Time: 10/31/17 10:19 AM  Result Value Ref Range   Glucose 84 65  - 99 mg/dL   BUN 14 8 - 27 mg/dL   Creatinine, Ser 1.08 (H) 0.57 - 1.00 mg/dL   GFR calc non Af Amer 53 (L) >59 mL/min/1.73   GFR calc Af Amer 61 >59 mL/min/1.73   BUN/Creatinine Ratio 13 12 - 28   Sodium 139 134 - 144 mmol/L   Potassium 5.0 3.5 - 5.2 mmol/L   Chloride 104 96 - 106 mmol/L   CO2 23 20 - 29 mmol/L   Calcium 9.7 8.7 - 10.3 mg/dL    Radiology: Dg Chest 2 View  Result Date: 10/31/2017 CLINICAL DATA:  Cough EXAM: CHEST - 2 VIEW COMPARISON:  04/03/2014 FINDINGS: Stable mild hyperinflation. Heart is normal size. Scattered aortic arch calcifications. No visible aneurysm. Biapical scarring, stable. Otherwise no confluent opacities or effusions. No acute bony abnormality. Thoracolumbar scoliosis. IMPRESSION: Provided patient hyperinflation and biapical scarring.  No active disease. Electronically Signed   By: Rolm Baptise M.D.   On: 10/31/2017 10:40    No results found.  No results found.    Assessment and Plan: Patient Active Problem List   Diagnosis Date Noted  . Nicotine dependence, cigarettes, uncomplicated 20/94/7096  . Cough 10/15/2017  . Gallstones 10/15/2017  . Abnormal renal function 10/15/2017  . Vertigo 09/23/2017  . Generalized anxiety disorder 09/23/2017  . Hyperlipidemia 07/23/2017  . Mild intermittent asthma 07/23/2017  . Essential hypertension 04/23/2017  . Acute non-recurrent maxillary sinusitis 04/23/2017  . Menopause 07/26/2016  . Vaginal atrophy 07/26/2016  . Female dyspareunia 07/26/2016  . Microscopic hematuria 07/28/2014   1. Chronic obstructive pulmonary disease, unspecified COPD type ([place]) Again encourage patient to use Symbicort inhaler daily as prescribed.  Also gave patient sample of Dulera and asked her to try Dulera 1 puff twice daily and increase to 2 puffs twice daily before her next visit.  If this works a prescription will be provided.  Also discussed smoking cessation with patient and how this would improve her COPD symptoms.  2.  Mild intermittent asthma, unspecified whether complicated Stable, encourage patient to use Dulera and Symbicort daily as described.  I believe if she used her inhalers as directed she would need her albuterol rescue inhaler less.  3. Essential hypertension Stable, continue current medications as prescribed.  4. Nicotine dependence, cigarettes, uncomplicated Smoking cessation counseling: 1. Pt acknowledges the risks of long term smoking, she will try to quite smoking. 2. Options for different medications including nicotine products, chewing gum, patch etc, Wellbutrin and Chantix is discussed 3. Goal and date of compete cessation is discussed 4. Total time spent in smoking cessation is 15 min.    General Counseling: I have discussed the findings of the evaluation and examination with [place].  I have also discussed any further diagnostic evaluation thatmay be needed or ordered today. Elycia verbalizes understanding of the findings of todays visit. We also reviewed her medications today and discussed drug interactions and side effects including but not limited excessive drowsiness and altered mental states. We also discussed that there is always a risk not just to her but also people around her. she has been encouraged to call the office with any questions or concerns that should arise related to todays visit.    Time spent: 35 This patient was seen by Orson Gear AGNP-C in Collaboration with Dr. Devona Konig as a part of collaborative care agreement.   I have personally obtained a history, examined the patient, evaluated laboratory and imaging results, formulated the assessment and plan and placed orders.    Allyne Gee, MD [facility] Pulmonary and Critical Care Sleep medicine

## 2018-01-17 NOTE — Patient Instructions (Signed)
Chronic Obstructive Pulmonary Disease Chronic obstructive pulmonary disease (COPD) is a long-term (chronic) lung problem. When you have COPD, it is hard for air to get in and out of your lungs. Usually the condition gets worse over time, and your lungs will never return to normal. There are things you can do to keep yourself as healthy as possible.  Your doctor may treat your condition with: ? Medicines. ? Oxygen. ? Lung surgery.  Your doctor may also recommend: ? Rehabilitation. This includes steps to make your body work better. It may involve a team of specialists. ? Quitting smoking, if you smoke. ? Exercise and changes to your diet. ? Comfort measures (palliative care). Follow these instructions at home: Medicines  Take over-the-counter and prescription medicines only as told by your doctor.  Talk to your doctor before taking any cough or allergy medicines. You may need to avoid medicines that cause your lungs to be dry. Lifestyle  If you smoke, stop. Smoking makes the problem worse. If you need help quitting, ask your doctor.  Avoid being around things that make your breathing worse. This may include smoke, chemicals, and fumes.  Stay active, but remember to rest as well.  Learn and use tips on how to relax.  Make sure you get enough sleep. Most adults need at least 7 hours of sleep every night.  Eat healthy foods. Eat smaller meals more often. Rest before meals. Controlled breathing Learn and use tips on how to control your breathing as told by your doctor. Try:  Breathing in (inhaling) through your nose for 1 second. Then, pucker your lips and breath out (exhale) through your lips for 2 seconds.  Putting one hand on your belly (abdomen). Breathe in slowly through your nose for 1 second. Your hand on your belly should move out. Pucker your lips and breathe out slowly through your lips. Your hand on your belly should move in as you breathe out.  Controlled coughing Learn  and use controlled coughing to clear mucus from your lungs. Follow these steps: 1. Lean your head a little forward. 2. Breathe in deeply. 3. Try to hold your breath for 3 seconds. 4. Keep your mouth slightly open while coughing 2 times. 5. Spit any mucus out into a tissue. 6. Rest and do the steps again 1 or 2 times as needed. General instructions  Make sure you get all the shots (vaccines) that your doctor recommends. Ask your doctor about a flu shot and a pneumonia shot.  Use oxygen therapy and pulmonary rehabilitation if told by your doctor. If you need home oxygen therapy, ask your doctor if you should buy a tool to measure your oxygen level (oximeter).  Make a COPD action plan with your doctor. This helps you to know what to do if you feel worse than usual.  Manage any other conditions you have as told by your doctor.  Avoid going outside when it is very hot, cold, or humid.  Avoid people who have a sickness you can catch (contagious).  Keep all follow-up visits as told by your doctor. This is important. Contact a doctor if:  You cough up more mucus than usual.  There is a change in the color or thickness of the mucus.  It is harder to breathe than usual.  Your breathing is faster than usual.  You have trouble sleeping.  You need to use your medicines more often than usual.  You have trouble doing your normal activities such as getting dressed   or walking around the house. Get help right away if:  You have shortness of breath while resting.  You have shortness of breath that stops you from: ? Being able to talk. ? Doing normal activities.  Your chest hurts for longer than 5 minutes.  Your skin color is more blue than usual.  Your pulse oximeter shows that you have low oxygen for longer than 5 minutes.  You have a fever.  You feel too tired to breathe normally. Summary  Chronic obstructive pulmonary disease (COPD) is a long-term lung problem.  The way your  lungs work will never return to normal. Usually the condition gets worse over time. There are things you can do to keep yourself as healthy as possible.  Take over-the-counter and prescription medicines only as told by your doctor.  If you smoke, stop. Smoking makes the problem worse. This information is not intended to replace advice given to you by your health care provider. Make sure you discuss any questions you have with your health care provider. Document Released: 07/05/2007 Document Revised: 02/21/2016 Document Reviewed: 02/21/2016 Elsevier Interactive Patient Education  2019 Elsevier Inc.  

## 2018-02-06 DIAGNOSIS — H40153 Residual stage of open-angle glaucoma, bilateral: Secondary | ICD-10-CM | POA: Diagnosis not present

## 2018-02-08 NOTE — Telephone Encounter (Signed)
done

## 2018-02-11 ENCOUNTER — Encounter: Payer: Self-pay | Admitting: Nurse Practitioner

## 2018-02-11 ENCOUNTER — Ambulatory Visit (INDEPENDENT_AMBULATORY_CARE_PROVIDER_SITE_OTHER): Payer: PPO | Admitting: Nurse Practitioner

## 2018-02-11 VITALS — BP 135/80 | HR 98 | Temp 97.9°F | Resp 16 | Ht 65.0 in | Wt 132.0 lb

## 2018-02-11 DIAGNOSIS — J449 Chronic obstructive pulmonary disease, unspecified: Secondary | ICD-10-CM | POA: Diagnosis not present

## 2018-02-11 DIAGNOSIS — J0191 Acute recurrent sinusitis, unspecified: Secondary | ICD-10-CM

## 2018-02-11 DIAGNOSIS — I10 Essential (primary) hypertension: Secondary | ICD-10-CM | POA: Diagnosis not present

## 2018-02-11 MED ORDER — AMOXICILLIN-POT CLAVULANATE 875-125 MG PO TABS: 1.0000 | ORAL_TABLET | Freq: Two times a day (BID) | ORAL | 0 refills | Status: DC

## 2018-02-11 NOTE — Progress Notes (Signed)
[facility] [place], [place] [address]  Internal MEDICINE  Office Visit Note  Patient Name: Shari Gutierrez  [id number]  [id number]  Date of Service: 02/11/2018   Pt is here for a sick visit.  Chief Complaint  Patient presents with  . Cough  . Sinusitis     Sinusitis  This is a new problem. The current episode started in the past 7 days. The problem has been gradually worsening since onset. The maximum temperature recorded prior to her arrival was 100.4 - 100.9 F. The fever has been present for less than 1 day. Her pain is at a severity of 2/10. The pain is mild. Associated symptoms include chills, congestion, coughing, headaches, sinus pressure and a sore throat. Past treatments include oral decongestants and acetaminophen. The treatment provided mild relief.        Current Medication:  Outpatient Encounter Medications as of 02/11/2018  Medication Sig Note  . ALPRAZolam (XANAX) 0.25 MG tablet Take 1 tablet (0.25 mg total) by mouth at bedtime as needed for anxiety.   Marland Kitchen amoxicillin-clavulanate (AUGMENTIN) 875-125 MG tablet Take 1 tablet by mouth 2 (two) times daily.   . cholecalciferol (VITAMIN D) 1000 UNITS tablet Take 1,000 Units by mouth 2 (two) times daily.   Marland Kitchen conjugated estrogens (PREMARIN) vaginal cream Place 8.58 Applicatorfuls vaginally 2 (two) times a week.   . fenofibrate 160 MG tablet Take 1 tablet (160 mg total) by mouth daily.   Marland Kitchen latanoprost (XALATAN) 0.005 % ophthalmic solution INSTILL 1 DROP AT BEDTIME INTO BOTH EYES   . meclizine (ANTIVERT) 25 MG tablet Take 1 tablet (25 mg total) by mouth 2 (two) times daily as needed for dizziness.   . metoprolol tartrate (LOPRESSOR) 25 MG tablet Take 1 tablet (25 mg total) by mouth 2 (two) times daily.   Marland Kitchen PROAIR HFA 108 (90 Base) MCG/ACT inhaler USE 1-2 PUFFS 3 TIMES DAILY IF NEEDED FOR WHEEZING   . tetrahydrozoline (EYE DROPS) 0.05 % ophthalmic solution Place 1 drop daily into both eyes.    .  Tiotropium Bromide Monohydrate (SPIRIVA RESPIMAT) 2.5 MCG/ACT AERS Inhale 1 Inhaler into the lungs daily.   . [DISCONTINUED] cefUROXime (CEFTIN) 500 MG tablet Take 1 tablet (500 mg total) by mouth 2 (two) times daily with a meal. 02/11/2018: nausea/vomiting.   No facility-administered encounter medications on file as of 02/11/2018.       Medical History: Past Medical History:  Diagnosis Date  . Anxiety   . 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 Gall stones   . Hematuria   . Hyperlipidemia   . Hypertension   . Reflux   . Sinusitis   . Vitamin D deficiency      Today's Vitals   02/11/18 1347  BP: 135/80  Pulse: 98  Resp: 16  Temp: 97.9 F (36.6 C)  SpO2: 97%  Weight: 132 lb (59.9 kg)  Height: 5\' 5"  (1.651 m)    Review of Systems  Constitutional: Positive for chills.  HENT: Positive for congestion, sinus pressure and sore throat.   Respiratory: Positive for cough and wheezing.   Cardiovascular: Negative for chest pain and palpitations.  Gastrointestinal: Positive for nausea.  Musculoskeletal: Positive for myalgias.  Allergic/Immunologic: Positive for environmental allergies.  Neurological: Positive for headaches. Negative for dizziness.  Hematological: Positive for adenopathy.    Physical Exam Vitals signs and nursing note reviewed.  Constitutional:      General: She is not in acute distress.    Appearance: Normal appearance. She  is well-developed. She is not diaphoretic.  HENT:     Head: Normocephalic and atraumatic.     Right Ear: Tympanic membrane is bulging. Tympanic membrane is not erythematous.     Left Ear: Tympanic membrane is bulging. Tympanic membrane is not erythematous.     Nose: Congestion present. No rhinorrhea.     Right Sinus: Maxillary sinus tenderness present. No frontal sinus tenderness.     Left Sinus: Maxillary sinus tenderness present. No frontal sinus tenderness.     Mouth/Throat:     Pharynx: Posterior oropharyngeal erythema present. No oropharyngeal  exudate.  Eyes:     Conjunctiva/sclera: Conjunctivae normal.     Pupils: Pupils are equal, round, and reactive to light.  Neck:     Musculoskeletal: Normal range of motion and neck supple.     Thyroid: No thyromegaly.     Vascular: No JVD.     Trachea: No tracheal deviation.  Cardiovascular:     Rate and Rhythm: Normal rate and regular rhythm.     Heart sounds: Normal heart sounds. No murmur. No friction rub. No gallop.   Pulmonary:     Effort: Pulmonary effort is normal. No respiratory distress.     Breath sounds: Normal breath sounds. No wheezing or rales.     Comments: Congested, non-productive cugh noted.  Chest:     Chest wall: No tenderness.  Abdominal:     General: Bowel sounds are normal.     Palpations: Abdomen is soft.     Tenderness: There is no abdominal tenderness.  Musculoskeletal: Normal range of motion.  Lymphadenopathy:     Cervical: Cervical adenopathy present.  Skin:    General: Skin is warm and dry.     Capillary Refill: Capillary refill takes less than 2 seconds.  Neurological:     Mental Status: She is alert and oriented to person, place, and time.     Cranial Nerves: No cranial nerve deficit.  Psychiatric:        Behavior: Behavior normal.        Thought Content: Thought content normal.        Judgment: Judgment normal.   Assessment/Plan:  1. Acute recurrent sinusitis, unspecified location Start augmentin 875mg  twice daily for 10 days. Rest and increase fluids. Take OTC medication to improve symptoms.  - amoxicillin-clavulanate (AUGMENTIN) 875-125 MG tablet; Take 1 tablet by mouth 2 (two) times daily.  Dispense: 20 tablet; Refill: 0  2. Chronic obstructive pulmonary disease, unspecified COPD type ([place]) Continue to use inhalers as prescribed.   3. Essential hypertension Stable. Continue bp medication as prescribed.   General Counseling: Shari Gutierrez verbalizes understanding of the findings of todays visit and agrees with plan of treatment. I have  discussed any further diagnostic evaluation that may be needed or ordered today. We also reviewed her medications today. she has been encouraged to call the office with any questions or concerns that should arise related to todays visit.    Counseling:  Rest and increase fluids. Continue using OTC medication to control symptoms.   This patient was seen by [place] with Dr Lavera Guise as a part of collaborative care agreement  Meds ordered this encounter  Medications  . amoxicillin-clavulanate (AUGMENTIN) 875-125 MG tablet    Sig: Take 1 tablet by mouth 2 (two) times daily.    Dispense:  20 tablet    Refill:  0    Order Specific Question:   Supervising Provider    Answer:   Humphrey Rolls,  FOZIA M [[id number]]    Time spent: 25 Minutes

## 2018-02-21 ENCOUNTER — Encounter: Payer: Self-pay | Admitting: Internal Medicine

## 2018-02-21 ENCOUNTER — Ambulatory Visit (INDEPENDENT_AMBULATORY_CARE_PROVIDER_SITE_OTHER): Payer: PPO | Admitting: Internal Medicine

## 2018-02-21 VITALS — BP 120/78 | HR 75 | Resp 16 | Ht 65.0 in | Wt 128.0 lb

## 2018-02-21 DIAGNOSIS — J4520 Mild intermittent asthma, uncomplicated: Secondary | ICD-10-CM | POA: Diagnosis not present

## 2018-02-21 DIAGNOSIS — F17210 Nicotine dependence, cigarettes, uncomplicated: Secondary | ICD-10-CM | POA: Diagnosis not present

## 2018-02-21 DIAGNOSIS — J449 Chronic obstructive pulmonary disease, unspecified: Secondary | ICD-10-CM | POA: Diagnosis not present

## 2018-02-21 MED ORDER — TIOTROPIUM BROMIDE MONOHYDRATE 2.5 MCG/ACT IN AERS: 1.0000 | INHALATION_SPRAY | Freq: Every day | RESPIRATORY_TRACT | 5 refills | Status: DC

## 2018-02-21 MED ORDER — MOMETASONE FURO-FORMOTEROL FUM 100-5 MCG/ACT IN AERO: 2.0000 | INHALATION_SPRAY | Freq: Two times a day (BID) | RESPIRATORY_TRACT | 5 refills | Status: DC

## 2018-02-21 NOTE — Patient Instructions (Signed)
Chronic Obstructive Pulmonary Disease Chronic obstructive pulmonary disease (COPD) is a long-term (chronic) lung problem. When you have COPD, it is hard for air to get in and out of your lungs. Usually the condition gets worse over time, and your lungs will never return to normal. There are things you can do to keep yourself as healthy as possible.  Your doctor may treat your condition with: ? Medicines. ? Oxygen. ? Lung surgery.  Your doctor may also recommend: ? Rehabilitation. This includes steps to make your body work better. It may involve a team of specialists. ? Quitting smoking, if you smoke. ? Exercise and changes to your diet. ? Comfort measures (palliative care). Follow these instructions at home: Medicines  Take over-the-counter and prescription medicines only as told by your doctor.  Talk to your doctor before taking any cough or allergy medicines. You may need to avoid medicines that cause your lungs to be dry. Lifestyle  If you smoke, stop. Smoking makes the problem worse. If you need help quitting, ask your doctor.  Avoid being around things that make your breathing worse. This may include smoke, chemicals, and fumes.  Stay active, but remember to rest as well.  Learn and use tips on how to relax.  Make sure you get enough sleep. Most adults need at least 7 hours of sleep every night.  Eat healthy foods. Eat smaller meals more often. Rest before meals. Controlled breathing Learn and use tips on how to control your breathing as told by your doctor. Try:  Breathing in (inhaling) through your nose for 1 second. Then, pucker your lips and breath out (exhale) through your lips for 2 seconds.  Putting one hand on your belly (abdomen). Breathe in slowly through your nose for 1 second. Your hand on your belly should move out. Pucker your lips and breathe out slowly through your lips. Your hand on your belly should move in as you breathe out.  Controlled coughing Learn  and use controlled coughing to clear mucus from your lungs. Follow these steps: 1. Lean your head a little forward. 2. Breathe in deeply. 3. Try to hold your breath for 3 seconds. 4. Keep your mouth slightly open while coughing 2 times. 5. Spit any mucus out into a tissue. 6. Rest and do the steps again 1 or 2 times as needed. General instructions  Make sure you get all the shots (vaccines) that your doctor recommends. Ask your doctor about a flu shot and a pneumonia shot.  Use oxygen therapy and pulmonary rehabilitation if told by your doctor. If you need home oxygen therapy, ask your doctor if you should buy a tool to measure your oxygen level (oximeter).  Make a COPD action plan with your doctor. This helps you to know what to do if you feel worse than usual.  Manage any other conditions you have as told by your doctor.  Avoid going outside when it is very hot, cold, or humid.  Avoid people who have a sickness you can catch (contagious).  Keep all follow-up visits as told by your doctor. This is important. Contact a doctor if:  You cough up more mucus than usual.  There is a change in the color or thickness of the mucus.  It is harder to breathe than usual.  Your breathing is faster than usual.  You have trouble sleeping.  You need to use your medicines more often than usual.  You have trouble doing your normal activities such as getting dressed   or walking around the house. Get help right away if:  You have shortness of breath while resting.  You have shortness of breath that stops you from: ? Being able to talk. ? Doing normal activities.  Your chest hurts for longer than 5 minutes.  Your skin color is more blue than usual.  Your pulse oximeter shows that you have low oxygen for longer than 5 minutes.  You have a fever.  You feel too tired to breathe normally. Summary  Chronic obstructive pulmonary disease (COPD) is a long-term lung problem.  The way your  lungs work will never return to normal. Usually the condition gets worse over time. There are things you can do to keep yourself as healthy as possible.  Take over-the-counter and prescription medicines only as told by your doctor.  If you smoke, stop. Smoking makes the problem worse. This information is not intended to replace advice given to you by your health care provider. Make sure you discuss any questions you have with your health care provider. Document Released: 07/05/2007 Document Revised: 02/21/2016 Document Reviewed: 02/21/2016 Elsevier Interactive Patient Education  2019 Elsevier Inc.  

## 2018-02-21 NOTE — Progress Notes (Signed)
[facility] [place], [place] [address]  Pulmonary Sleep Medicine   Office Visit Note  Patient Name: Shari Gutierrez DOB: 05-02-1950 MRN [id number]  Date of Service: 02/21/2018  Complaints/HPI: Still smoking Asthma/COPD using Ruthe Mannan and Spiriva patient has been complaining about having some cough and congestion.  She has been using her inhalers as noted above.  Patient as already noted above also she is still smoking.  Had a very lengthy discussion about smoking cessation and the importance of doing this in light of her persistent symptoms.  ROS  General: (-) fever, (-) chills, (-) night sweats, (-) weakness Skin: (-) rashes, (-) itching,. Eyes: (-) visual changes, (-) redness, (-) itching. Nose and Sinuses: (-) nasal stuffiness or itchiness, (-) postnasal drip, (-) nosebleeds, (-) sinus trouble. Mouth and Throat: (-) sore throat, (-) hoarseness. Neck: (-) swollen glands, (-) enlarged thyroid, (-) neck pain. Respiratory: + cough, (-) bloody sputum, + shortness of breath, - wheezing. Cardiovascular: - ankle swelling, (-) chest pain. Lymphatic: (-) lymph node enlargement. Neurologic: (-) numbness, (-) tingling. Psychiatric: (-) anxiety, (-) depression   Current Medication: Outpatient Encounter Medications as of 02/21/2018  Medication Sig  . ALPRAZolam (XANAX) 0.25 MG tablet Take 1 tablet (0.25 mg total) by mouth at bedtime as needed for anxiety.  . cholecalciferol (VITAMIN D) 1000 UNITS tablet Take 1,000 Units by mouth 2 (two) times daily.  Marland Kitchen conjugated estrogens (PREMARIN) vaginal cream Place 0.08 Applicatorfuls vaginally 2 (two) times a week.  . fenofibrate 160 MG tablet Take 1 tablet (160 mg total) by mouth daily.  Marland Kitchen latanoprost (XALATAN) 0.005 % ophthalmic solution INSTILL 1 DROP AT BEDTIME INTO BOTH EYES  . meclizine (ANTIVERT) 25 MG tablet Take 1 tablet (25 mg total) by mouth 2 (two) times daily as needed for dizziness.  . metoprolol tartrate  (LOPRESSOR) 25 MG tablet Take 1 tablet (25 mg total) by mouth 2 (two) times daily.  . mometasone-formoterol (DULERA) 100-5 MCG/ACT AERO Inhale 2 puffs into the lungs 2 (two) times daily.  Marland Kitchen tetrahydrozoline (EYE DROPS) 0.05 % ophthalmic solution Place 1 drop daily into both eyes.   . Tiotropium Bromide Monohydrate (SPIRIVA RESPIMAT) 2.5 MCG/ACT AERS Inhale 1 Inhaler into the lungs daily.  . [DISCONTINUED] amoxicillin-clavulanate (AUGMENTIN) 875-125 MG tablet Take 1 tablet by mouth 2 (two) times daily. (Patient not taking: Reported on 02/21/2018)  . [DISCONTINUED] PROAIR HFA 108 (90 Base) MCG/ACT inhaler USE 1-2 PUFFS 3 TIMES DAILY IF NEEDED FOR WHEEZING (Patient not taking: Reported on 02/21/2018)   No facility-administered encounter medications on file as of 02/21/2018.     Surgical History: Past Surgical History:  Procedure Laterality Date  . APPENDECTOMY  1989  . COLONOSCOPY  2016  . OOPHORECTOMY  1989    Medical History: Past Medical History:  Diagnosis Date  . Anxiety   . 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 Gall stones   . Hematuria   . Hyperlipidemia   . Hypertension   . Reflux   . Sinusitis   . Vitamin D deficiency     Family History: Family History  Problem Relation Age of Onset  . Kidney failure Mother   . Cancer Father        Gastric  . Breast cancer Neg Hx   . Ovarian cancer Neg Hx   . Colon cancer Neg Hx   . Heart disease Neg Hx   . Diabetes Neg Hx     Social History: Social History   Socioeconomic History  . Marital  status: Married    Spouse name: Not on file  . Number of children: Not on file  . Years of education: Not on file  . Highest education level: Not on file  Occupational History  . Not on file  Social Needs  . Financial resource strain: Not on file  . Food insecurity:    Worry: Not on file    Inability: Not on file  . Transportation needs:    Medical: Not on file    Non-medical: Not on file  Tobacco Use  . Smoking status: Current Every Day  Smoker    Packs/day: 1.00    Years: 30.00    Pack years: 30.00    Types: Cigarettes    Start date: 07/28/1979  . Smokeless tobacco: Never Used  . Tobacco comment: pt is using a patch to help quit  Substance and Sexual Activity  . Alcohol use: Yes    Alcohol/week: 0.0 standard drinks    Comment: occasionally 1/2 beer  . Drug use: No  . Sexual activity: Yes    Birth control/protection: Post-menopausal  Lifestyle  . Physical activity:    Days per week: 2 days    Minutes per session: 30 min  . Stress: Not on file  Relationships  . Social connections:    Talks on phone: Not on file    Gets together: Not on file    Attends religious service: Not on file    Active member of club or organization: Not on file    Attends meetings of clubs or organizations: Not on file    Relationship status: Not on file  . Intimate partner violence:    Fear of current or ex partner: Not on file    Emotionally abused: Not on file    Physically abused: Not on file    Forced sexual activity: Not on file  Other Topics Concern  . Not on file  Social History Narrative  . Not on file    Vital Signs: Blood pressure 120/78, pulse 75, resp. rate 16, height 5\' 5"  (1.651 m), weight 128 lb (58.1 kg), SpO2 98 %.  Examination: General Appearance: The patient is well-developed, well-nourished, and in no distress. Skin: Gross inspection of skin unremarkable. Head: normocephalic, no gross deformities. Eyes: no gross deformities noted. ENT: ears appear grossly normal no exudates. Neck: Supple. No thyromegaly. No LAD. Respiratory: few rhonchi noted. Cardiovascular: Normal S1 and S2 without murmur or rub. Extremities: No cyanosis. pulses are equal. Neurologic: Alert and oriented. No involuntary movements.  LABS: Recent Results (from the past 2160 hour(s))  Pulmonary function test     Status: None   Collection Time: 01/09/18  9:30 AM  Result Value Ref Range   FEV1     FVC     FEV1/FVC     TLC     DLCO       Radiology: Dg Chest 2 View  Result Date: 10/31/2017 CLINICAL DATA:  Cough EXAM: CHEST - 2 VIEW COMPARISON:  04/03/2014 FINDINGS: Stable mild hyperinflation. Heart is normal size. Scattered aortic arch calcifications. No visible aneurysm. Biapical scarring, stable. Otherwise no confluent opacities or effusions. No acute bony abnormality. Thoracolumbar scoliosis. IMPRESSION: Stable hyperinflation and biapical scarring.  No active disease. Electronically Signed   By: Rolm Baptise M.D.   On: 10/31/2017 10:40    No results found.  No results found.    Assessment and Plan: Patient Active Problem List   Diagnosis Date Noted  . Acute recurrent sinusitis 02/11/2018  .  Chronic obstructive pulmonary disease ([place]) 02/11/2018  . Nicotine dependence, cigarettes, uncomplicated 23/76/2831  . Cough 10/15/2017  . Gallstones 10/15/2017  . Abnormal renal function 10/15/2017  . Vertigo 09/23/2017  . Generalized anxiety disorder 09/23/2017  . Hyperlipidemia 07/23/2017  . Mild intermittent asthma 07/23/2017  . Essential hypertension 04/23/2017  . Acute non-recurrent maxillary sinusitis 04/23/2017  . Menopause 07/26/2016  . Vaginal atrophy 07/26/2016  . Female dyspareunia 07/26/2016  . Microscopic hematuria 07/28/2014    1. COPD mild based on her last PFT but she continues to smoke which is probably causing her to have acute exacerbations of chronic bronchitis.  Smoking cessation was discussed at length with her. 2. Tobacco user continues to smoke as above smoking cessation discussed at length 3. Sinusitis resolved we will continue to monitor  General Counseling: I have discussed the findings of the evaluation and examination with Ikhlas.  I have also discussed any further diagnostic evaluation thatmay be needed or ordered today. Madalaine verbalizes understanding of the findings of todays visit. We also reviewed her medications today and discussed drug interactions and side effects including but  not limited excessive drowsiness and altered mental states. We also discussed that there is always a risk not just to her but also people around her. she has been encouraged to call the office with any questions or concerns that should arise related to todays visit.    Time spent: 15 minutes  I have personally obtained a history, examined the patient, evaluated laboratory and imaging results, formulated the assessment and plan and placed orders.    Allyne Gee, MD [facility] Pulmonary and Critical Care Sleep medicine

## 2018-03-12 ENCOUNTER — Other Ambulatory Visit: Payer: Self-pay | Admitting: Nurse Practitioner

## 2018-03-12 DIAGNOSIS — Z1231 Encounter for screening mammogram for malignant neoplasm of breast: Secondary | ICD-10-CM

## 2018-03-15 ENCOUNTER — Other Ambulatory Visit: Payer: Self-pay

## 2018-03-15 MED ORDER — METOPROLOL TARTRATE 25 MG PO TABS: 25.0000 mg | ORAL_TABLET | Freq: Two times a day (BID) | ORAL | 3 refills | Status: DC

## 2018-03-25 ENCOUNTER — Telehealth: Payer: Self-pay

## 2018-03-25 ENCOUNTER — Other Ambulatory Visit: Payer: Self-pay | Admitting: Nurse Practitioner

## 2018-03-25 DIAGNOSIS — J0191 Acute recurrent sinusitis, unspecified: Secondary | ICD-10-CM

## 2018-03-25 MED ORDER — AMOXICILLIN-POT CLAVULANATE 875-125 MG PO TABS: 1.0000 | ORAL_TABLET | Freq: Two times a day (BID) | ORAL | 0 refills | Status: DC

## 2018-03-25 NOTE — Progress Notes (Signed)
Sent prescriptoin for augmntin to [address] for sinus infection. Use OTC medication to treat acute symptoms. Needs appointment if no better over next several days.

## 2018-03-25 NOTE — Telephone Encounter (Signed)
Pt was notified.  

## 2018-03-25 NOTE — Telephone Encounter (Signed)
Sent prescriptoin for augmntin to [address] for sinus infection. Use OTC medication to treat acute symptoms. Needs appointment if no better over next several days.

## 2018-04-01 ENCOUNTER — Ambulatory Visit
Admission: RE | Admit: 2018-04-01 | Discharge: 2018-04-01 | Disposition: A | Discharge status: Home / Self Care | Payer: PPO | Source: Ambulatory Visit | Attending: Nurse Practitioner | Admitting: Nurse Practitioner

## 2018-04-01 DIAGNOSIS — Z1231 Encounter for screening mammogram for malignant neoplasm of breast: Secondary | ICD-10-CM | POA: Insufficient documentation

## 2018-04-17 ENCOUNTER — Telehealth: Payer: Self-pay

## 2018-04-17 DIAGNOSIS — L821 Other seborrheic keratosis: Secondary | ICD-10-CM | POA: Diagnosis not present

## 2018-04-17 DIAGNOSIS — X58XXXA Exposure to other specified factors, initial encounter: Secondary | ICD-10-CM | POA: Diagnosis not present

## 2018-04-17 NOTE — Telephone Encounter (Signed)
error 

## 2018-04-22 ENCOUNTER — Other Ambulatory Visit: Payer: Self-pay

## 2018-04-22 MED ORDER — MOMETASONE FURO-FORMOTEROL FUM 200-5 MCG/ACT IN AERO: 2.0000 | INHALATION_SPRAY | Freq: Two times a day (BID) | RESPIRATORY_TRACT | 3 refills | Status: DC

## 2018-04-24 ENCOUNTER — Encounter: Payer: Self-pay | Admitting: Nurse Practitioner

## 2018-04-24 ENCOUNTER — Telehealth: Payer: Self-pay

## 2018-04-24 ENCOUNTER — Other Ambulatory Visit: Payer: Self-pay

## 2018-04-24 ENCOUNTER — Ambulatory Visit (INDEPENDENT_AMBULATORY_CARE_PROVIDER_SITE_OTHER): Payer: PPO | Admitting: Nurse Practitioner

## 2018-04-24 VITALS — BP 120/78 | HR 83 | Temp 98.0°F | Resp 16 | Ht 65.0 in | Wt 129.0 lb

## 2018-04-24 DIAGNOSIS — M545 Low back pain, unspecified: Secondary | ICD-10-CM | POA: Insufficient documentation

## 2018-04-24 DIAGNOSIS — J4520 Mild intermittent asthma, uncomplicated: Secondary | ICD-10-CM

## 2018-04-24 LAB — POCT URINALYSIS DIPSTICK
Bilirubin, UA: NEGATIVE
Blood, UA: NEGATIVE
Glucose, UA: NEGATIVE
Ketones, UA: NEGATIVE
LEUKOCYTES UA: NEGATIVE
Nitrite, UA: NEGATIVE
Protein, UA: NEGATIVE
Spec Grav, UA: 1.01 (ref 1.010–1.025)
Urobilinogen, UA: 0.2 E.U./dL
pH, UA: 7 (ref 5.0–8.0)

## 2018-04-24 MED ORDER — BUDESONIDE-FORMOTEROL FUMARATE 160-4.5 MCG/ACT IN AERO: 2.0000 | INHALATION_SPRAY | Freq: Two times a day (BID) | RESPIRATORY_TRACT | 5 refills | Status: DC

## 2018-04-24 NOTE — Progress Notes (Signed)
[facility] [place], [place] [address]  Internal MEDICINE  Office Visit Note  Patient Name: Shari Gutierrez  [id number]  [id number]  Date of Service: 04/24/2018  Chief Complaint  Patient presents with  . Back Pain    feels she might have UTI     The patient states that she has back pain, right across the small of her back. Was picking up rocks in her yard and that is when discomfort started. Does not hurt more when she is doing anything in particular. Sometimes the back does not hurt at all, but other times, she knows it's uncomfortable. Denies dysuria. Urine sample is negative for WBC, blood, or other abnormalities.  States that her insurance sent her note stating they would no longer pay for duleral. Want her to change to either breo or symbicort.       Current Medication:  Outpatient Encounter Medications as of 04/24/2018  Medication Sig  . ALPRAZolam (XANAX) 0.25 MG tablet Take 1 tablet (0.25 mg total) by mouth at bedtime as needed for anxiety.  . cholecalciferol (VITAMIN D) 1000 UNITS tablet Take 1,000 Units by mouth 2 (two) times daily.  Marland Kitchen conjugated estrogens (PREMARIN) vaginal cream Place 8.75 Applicatorfuls vaginally 2 (two) times a week.  . fenofibrate 160 MG tablet Take 1 tablet (160 mg total) by mouth daily.  Marland Kitchen latanoprost (XALATAN) 0.005 % ophthalmic solution INSTILL 1 DROP AT BEDTIME INTO BOTH EYES  . meclizine (ANTIVERT) 25 MG tablet Take 1 tablet (25 mg total) by mouth 2 (two) times daily as needed for dizziness.  . metoprolol tartrate (LOPRESSOR) 25 MG tablet Take 1 tablet (25 mg total) by mouth 2 (two) times daily.  Marland Kitchen tetrahydrozoline (EYE DROPS) 0.05 % ophthalmic solution Place 1 drop daily into both eyes.   . Tiotropium Bromide Monohydrate (SPIRIVA RESPIMAT) 2.5 MCG/ACT AERS Inhale 1 Inhaler into the lungs daily.  . [DISCONTINUED] mometasone-formoterol (DULERA) 100-5 MCG/ACT AERO Inhale 2 puffs into the lungs 2 (two) times daily.   . [DISCONTINUED] mometasone-formoterol (DULERA) 200-5 MCG/ACT AERO Inhale 2 puffs into the lungs 2 (two) times daily.  . budesonide-formoterol (SYMBICORT) 160-4.5 MCG/ACT inhaler Inhale 2 puffs into the lungs 2 (two) times daily.  . [DISCONTINUED] amoxicillin-clavulanate (AUGMENTIN) 875-125 MG tablet Take 1 tablet by mouth 2 (two) times daily. (Patient not taking: Reported on 04/24/2018)   No facility-administered encounter medications on file as of 04/24/2018.       Medical History: Past Medical History:  Diagnosis Date  . Anxiety   . 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 Gall stones   . Hematuria   . Hyperlipidemia   . Hypertension   . Reflux   . Sinusitis   . Vitamin D deficiency      Today's Vitals   04/24/18 0952  BP: 120/78  Pulse: 83  Resp: 16  Temp: 98 F (36.7 C)  SpO2: 97%  Weight: 129 lb (58.5 kg)  Height: 5\' 5"  (1.651 m)   Body mass index is 21.47 kg/m.  Review of Systems  Constitutional: Negative for chills, fatigue and unexpected weight change.  HENT: Negative for congestion, postnasal drip, rhinorrhea, sneezing and sore throat.   Respiratory: Negative for cough, chest tightness and shortness of breath.   Cardiovascular: Negative for chest pain and palpitations.  Gastrointestinal: Negative for abdominal pain, constipation, diarrhea, nausea and vomiting.  Genitourinary: Positive for flank pain. Negative for dysuria and frequency.  Musculoskeletal: Positive for back pain and myalgias. Negative for arthralgias, joint swelling and neck  pain.  Skin: Negative for rash.  Hematological: Negative for adenopathy. Does not bruise/bleed easily.  Psychiatric/Behavioral: Negative for behavioral problems (Depression), sleep disturbance and suicidal ideas. The patient is not nervous/anxious.     Physical Exam Vitals signs and nursing note reviewed.  Constitutional:      General: She is not in acute distress.    Appearance: Normal appearance. She is well-developed. She is not  diaphoretic.  HENT:     Head: Normocephalic and atraumatic.     Mouth/Throat:     Pharynx: No oropharyngeal exudate.  Eyes:     Pupils: Pupils are equal, round, and reactive to light.  Neck:     Musculoskeletal: Normal range of motion and neck supple.     Thyroid: No thyromegaly.     Vascular: No JVD.     Trachea: No tracheal deviation.  Cardiovascular:     Rate and Rhythm: Normal rate and regular rhythm.     Heart sounds: Normal heart sounds. No murmur. No friction rub. No gallop.   Pulmonary:     Effort: Pulmonary effort is normal. No respiratory distress.     Breath sounds: Normal breath sounds. No wheezing or rales.  Chest:     Chest wall: No tenderness.  Abdominal:     General: Bowel sounds are normal.     Palpations: Abdomen is soft.     Tenderness: There is no abdominal tenderness.  Genitourinary:    Comments: Urine sample negative for any abnormalities today. Musculoskeletal: Normal range of motion.     Comments: Mild lower back pain which is intermittent. Tenderness seems worse when bending and twisting at the waist as well as changing position. No visible or palpable abnormalities present today.   Lymphadenopathy:     Cervical: No cervical adenopathy.  Skin:    General: Skin is warm and dry.  Neurological:     Mental Status: She is alert and oriented to person, place, and time.     Cranial Nerves: No cranial nerve deficit.  Psychiatric:        Behavior: Behavior normal.        Thought Content: Thought content normal.        Judgment: Judgment normal.   Assessment/Plan: 1. Acute midline low back pain without sciatica Urine sample negative for evidence of infection or other abnormalities of bladder or kidney. Recommend she take OTC NSAIDs as needed and as indicated. Use low heat to affected area to relax tight muscles.  - POCT Urinalysis Dipstick  2. Mild intermittent asthma without complication D/c dulera due to insurance preference. Substitute with symbicort  160/4.38mcg - two puffs twice daily. Continue spiriva daily.  - budesonide-formoterol (SYMBICORT) 160-4.5 MCG/ACT inhaler; Inhale 2 puffs into the lungs 2 (two) times daily.  Dispense: 1 Inhaler; Refill: 5  General Counseling: Airica verbalizes understanding of the findings of todays visit and agrees with plan of treatment. I have discussed any further diagnostic evaluation that may be needed or ordered today. We also reviewed her medications today. she has been encouraged to call the office with any questions or concerns that should arise related to todays visit.    Counseling:  This patient was seen by Leretha Pol FNP Collaboration with Dr Lavera Guise as a part of collaborative care agreement  Orders Placed This Encounter  Procedures  . POCT Urinalysis Dipstick    Meds ordered this encounter  Medications  . budesonide-formoterol (SYMBICORT) 160-4.5 MCG/ACT inhaler    Sig: Inhale 2 puffs into the lungs  2 (two) times daily.    Dispense:  1 Inhaler    Refill:  5    D/c dulera due to insurance preference.    Order Specific Question:   Supervising Provider    Answer:   Lavera Guise [[id number]]    Time spent: 25 Minutes

## 2018-04-24 NOTE — Telephone Encounter (Signed)
Pt had appt today.

## 2018-04-25 ENCOUNTER — Telehealth: Payer: Self-pay

## 2018-04-25 NOTE — Telephone Encounter (Signed)
symbicort is covered by brand name , pharmacy and patient notifed

## 2018-04-25 NOTE — Telephone Encounter (Signed)
Can we find out what maintenance inhaler her insurance will cover? Thanks.

## 2018-05-09 ENCOUNTER — Ambulatory Visit: Payer: Self-pay | Admitting: Nurse Practitioner

## 2018-07-01 ENCOUNTER — Other Ambulatory Visit: Payer: Self-pay | Admitting: Adult Health

## 2018-07-01 ENCOUNTER — Ambulatory Visit: Payer: PPO | Admitting: Adult Health

## 2018-07-01 ENCOUNTER — Encounter: Payer: Self-pay | Admitting: Adult Health

## 2018-07-01 ENCOUNTER — Other Ambulatory Visit: Payer: Self-pay

## 2018-07-01 DIAGNOSIS — J449 Chronic obstructive pulmonary disease, unspecified: Secondary | ICD-10-CM | POA: Diagnosis not present

## 2018-07-01 DIAGNOSIS — J4520 Mild intermittent asthma, uncomplicated: Secondary | ICD-10-CM | POA: Diagnosis not present

## 2018-07-01 DIAGNOSIS — I10 Essential (primary) hypertension: Secondary | ICD-10-CM

## 2018-07-01 DIAGNOSIS — F17210 Nicotine dependence, cigarettes, uncomplicated: Secondary | ICD-10-CM | POA: Diagnosis not present

## 2018-07-01 MED ORDER — FENOFIBRATE 160 MG PO TABS: 160.0000 mg | ORAL_TABLET | Freq: Every day | ORAL | 1 refills | Status: DC

## 2018-07-01 MED ORDER — METOPROLOL TARTRATE 25 MG PO TABS: 25.0000 mg | ORAL_TABLET | Freq: Two times a day (BID) | ORAL | 3 refills | Status: DC

## 2018-07-01 NOTE — Progress Notes (Signed)
Refills sent for patient, she will follow up on 6/15 for further refills.

## 2018-07-01 NOTE — Progress Notes (Signed)
[facility] [place], [place] [address]  Internal MEDICINE  Telephone Visit  Patient Name: Shari Gutierrez  [id number]  [id number]  Date of Service: 07/01/2018  I connected with the patient at 1052 by telephone and verified the patients identity using two identifiers.   I discussed the limitations, risks, security and privacy concerns of performing an evaluation and management service by telephone and the availability of in person appointments. I also discussed with the patient that there may be a patient responsible charge related to the service.  The patient expressed understanding and agrees to proceed.    Chief Complaint  Patient presents with  . Telephone Assessment  . Telephone Screen  . Follow-up    HPI  Pt seen for follow up on copd and asthma.  Overall she reports she has been doing well.  Denies any illness or hospitalizations recently. She has been using her inhalers and is requesting samples since she is out of work due to COVID-19 pandemic at this time.     Current Medication: Outpatient Encounter Medications as of 07/01/2018  Medication Sig  . ALPRAZolam (XANAX) 0.25 MG tablet Take 1 tablet (0.25 mg total) by mouth at bedtime as needed for anxiety.  . budesonide-formoterol (SYMBICORT) 160-4.5 MCG/ACT inhaler Inhale 2 puffs into the lungs 2 (two) times daily.  . cholecalciferol (VITAMIN D) 1000 UNITS tablet Take 1,000 Units by mouth 2 (two) times daily.  Marland Kitchen conjugated estrogens (PREMARIN) vaginal cream Place 8.41 Applicatorfuls vaginally 2 (two) times a week.  . fenofibrate 160 MG tablet Take 1 tablet (160 mg total) by mouth daily.  Marland Kitchen latanoprost (XALATAN) 0.005 % ophthalmic solution INSTILL 1 DROP AT BEDTIME INTO BOTH EYES  . meclizine (ANTIVERT) 25 MG tablet Take 1 tablet (25 mg total) by mouth 2 (two) times daily as needed for dizziness.  . metoprolol tartrate (LOPRESSOR) 25 MG tablet Take 1 tablet (25 mg total) by mouth 2 (two) times daily.   Marland Kitchen tetrahydrozoline (EYE DROPS) 0.05 % ophthalmic solution Place 1 drop daily into both eyes.   . Tiotropium Bromide Monohydrate (SPIRIVA RESPIMAT) 2.5 MCG/ACT AERS Inhale 1 Inhaler into the lungs daily.   No facility-administered encounter medications on file as of 07/01/2018.     Surgical History: Past Surgical History:  Procedure Laterality Date  . APPENDECTOMY  1989  . COLONOSCOPY  2016  . OOPHORECTOMY  1989    Medical History: Past Medical History:  Diagnosis Date  . Anxiety   . 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 Gall stones   . Hematuria   . Hyperlipidemia   . Hypertension   . Reflux   . Sinusitis   . Vitamin D deficiency     Family History: Family History  Problem Relation Age of Onset  . Kidney failure Mother   . Cancer Father        Gastric  . Breast cancer Neg Hx   . Ovarian cancer Neg Hx   . Colon cancer Neg Hx   . Heart disease Neg Hx   . Diabetes Neg Hx     Social History   Socioeconomic History  . Marital status: Married    Spouse name: Not on file  . Number of children: Not on file  . Years of education: Not on file  . Highest education level: Not on file  Occupational History  . Not on file  Social Needs  . Financial resource strain: Not on file  . Food insecurity:    Worry: Not  on file    Inability: Not on file  . Transportation needs:    Medical: Not on file    Non-medical: Not on file  Tobacco Use  . Smoking status: Current Every Day Smoker    Packs/day: 1.00    Years: 30.00    Pack years: 30.00    Types: Cigarettes    Start date: 07/28/1979  . Smokeless tobacco: Never Used  . Tobacco comment: pt is using a patch to help quit  Substance and Sexual Activity  . Alcohol use: Yes    Alcohol/week: 0.0 standard drinks    Comment: occasionally 1/2 beer  . Drug use: No  . Sexual activity: Yes    Birth control/protection: Post-menopausal  Lifestyle  . Physical activity:    Days per week: 2 days    Minutes per session: 30 min  . Stress: Not on  file  Relationships  . Social connections:    Talks on phone: Not on file    Gets together: Not on file    Attends religious service: Not on file    Active member of club or organization: Not on file    Attends meetings of clubs or organizations: Not on file    Relationship status: Not on file  . Intimate partner violence:    Fear of current or ex partner: Not on file    Emotionally abused: Not on file    Physically abused: Not on file    Forced sexual activity: Not on file  Other Topics Concern  . Not on file  Social History Narrative  . Not on file      Review of Systems  Constitutional: Negative for chills, fatigue and unexpected weight change.  HENT: Negative for congestion, rhinorrhea, sneezing and sore throat.   Eyes: Negative for photophobia, pain and redness.  Respiratory: Negative for cough, chest tightness and shortness of breath.   Cardiovascular: Negative for chest pain and palpitations.  Gastrointestinal: Negative for abdominal pain, constipation, diarrhea, nausea and vomiting.  Endocrine: Negative.   Genitourinary: Negative for dysuria and frequency.  Musculoskeletal: Negative for arthralgias, back pain, joint swelling and neck pain.  Skin: Negative for rash.  Allergic/Immunologic: Negative.   Neurological: Negative for tremors and numbness.  Hematological: Negative for adenopathy. Does not bruise/bleed easily.  Psychiatric/Behavioral: Negative for behavioral problems and sleep disturbance. The patient is not nervous/anxious.     Vital Signs: There were no vitals taken for this visit.   Observation/Objective:  Pt speaking full sentences, NAD noted.      Assessment/Plan: 1. Chronic obstructive pulmonary disease, unspecified COPD type (HCC) Continue Spiriva and Symbicort as discussed.  Left sample of Spiriva at desk for patient to pick up.  2. Mild intermittent asthma without complication Continue using inhalers as discussed.   3. Nicotine  dependence, cigarettes, uncomplicated Smoking cessation counseling: 1. Pt acknowledges the risks of long term smoking, she will try to quite smoking. 2. Options for different medications including nicotine products, chewing gum, patch etc, Wellbutrin and Chantix is discussed 3. Goal and date of compete cessation is discussed 4. Total time spent in smoking cessation is 15 min.  4. Essential hypertension Stable, continue present management.   General Counseling: Jalaiya verbalizes understanding of the findings of today's phone visit and agrees with plan of treatment. I have discussed any further diagnostic evaluation that may be needed or ordered today. We also reviewed her medications today. she has been encouraged to call the office with any questions or concerns that should  arise related to todays visit.    No orders of the defined types were placed in this encounter.   No orders of the defined types were placed in this encounter.   Time spent: 11  Minutes    Orson Gear AGNP-C Pulmonary medicine

## 2018-07-15 ENCOUNTER — Ambulatory Visit: Payer: Self-pay | Admitting: Nurse Practitioner

## 2018-08-01 DIAGNOSIS — H40153 Residual stage of open-angle glaucoma, bilateral: Secondary | ICD-10-CM | POA: Diagnosis not present

## 2018-08-06 ENCOUNTER — Encounter: Payer: PPO | Admitting: Obstetrics and Gynecology

## 2018-08-06 ENCOUNTER — Encounter: Payer: Self-pay | Admitting: Obstetrics and Gynecology

## 2018-08-16 ENCOUNTER — Other Ambulatory Visit: Payer: Self-pay

## 2018-08-16 DIAGNOSIS — J4520 Mild intermittent asthma, uncomplicated: Secondary | ICD-10-CM

## 2018-08-16 MED ORDER — BUDESONIDE-FORMOTEROL FUMARATE 160-4.5 MCG/ACT IN AERO: 2.0000 | INHALATION_SPRAY | Freq: Two times a day (BID) | RESPIRATORY_TRACT | 5 refills | Status: DC

## 2018-08-22 ENCOUNTER — Other Ambulatory Visit: Payer: Self-pay

## 2018-08-22 ENCOUNTER — Ambulatory Visit (INDEPENDENT_AMBULATORY_CARE_PROVIDER_SITE_OTHER): Payer: PPO | Admitting: Nurse Practitioner

## 2018-08-22 ENCOUNTER — Encounter: Payer: Self-pay | Admitting: Nurse Practitioner

## 2018-08-22 VITALS — BP 138/72 | HR 58 | Resp 16 | Ht 65.0 in | Wt 128.2 lb

## 2018-08-22 DIAGNOSIS — I10 Essential (primary) hypertension: Secondary | ICD-10-CM

## 2018-08-22 DIAGNOSIS — J449 Chronic obstructive pulmonary disease, unspecified: Secondary | ICD-10-CM

## 2018-08-22 DIAGNOSIS — Z79899 Other long term (current) drug therapy: Secondary | ICD-10-CM

## 2018-08-22 DIAGNOSIS — Z0001 Encounter for general adult medical examination with abnormal findings: Secondary | ICD-10-CM

## 2018-08-22 DIAGNOSIS — F411 Generalized anxiety disorder: Secondary | ICD-10-CM

## 2018-08-22 DIAGNOSIS — R300 Dysuria: Secondary | ICD-10-CM | POA: Diagnosis not present

## 2018-08-22 DIAGNOSIS — E559 Vitamin D deficiency, unspecified: Secondary | ICD-10-CM | POA: Diagnosis not present

## 2018-08-22 LAB — POCT URINE DRUG SCREEN
POC Amphetamine UR: NOT DETECTED
POC BENZODIAZEPINES UR: NOT DETECTED
POC Barbiturate UR: NOT DETECTED
POC Cocaine UR: NOT DETECTED
POC Ecstasy UR: NOT DETECTED
POC Marijuana UR: NOT DETECTED
POC Methadone UR: NOT DETECTED
POC Methamphetamine UR: NOT DETECTED
POC Opiate Ur: NOT DETECTED
POC Oxycodone UR: NOT DETECTED
POC PHENCYCLIDINE UR: NOT DETECTED
POC TRICYCLICS UR: NOT DETECTED

## 2018-08-22 MED ORDER — ALPRAZOLAM 0.25 MG PO TABS: 0.2500 mg | ORAL_TABLET | Freq: Every evening | ORAL | 2 refills | Status: AC | PRN

## 2018-08-22 NOTE — Progress Notes (Signed)
[facility] [place], [place] [address]  Internal MEDICINE  Office Visit Note  Patient Name: Shari Gutierrez  [id number]  [id number]  Date of Service: 08/28/2018   Pt is here for routine health maintenance examination   Chief Complaint  Patient presents with  . Medicare Wellness    pt may possibly need medication refills  . Anxiety  . Hypertension  . Hyperlipidemia     The patient is here for medicare wellness visit. She has GYN provider who does her pap smears and breast exams. They also order her mammograms. Breathing has been well controlled. She is using symbicort twice daily and spiriva daily. With this combination, she rarely needs to use rescue inhaler. Blood pressure is well managed. She is due to have routine, fasting labs. She does take alprazolam 0.25mg  at bedtime if needed for acute anxiety. The last prescription was given 08/2017.     Current Medication: Outpatient Encounter Medications as of 08/22/2018  Medication Sig  . ALPRAZolam (XANAX) 0.25 MG tablet Take 1 tablet (0.25 mg total) by mouth at bedtime as needed for anxiety.  . budesonide-formoterol (SYMBICORT) 160-4.5 MCG/ACT inhaler Inhale 2 puffs into the lungs 2 (two) times daily.  . cholecalciferol (VITAMIN D) 1000 UNITS tablet Take 1,000 Units by mouth 2 (two) times daily.  Marland Kitchen conjugated estrogens (PREMARIN) vaginal cream Place 2.95 Applicatorfuls vaginally 2 (two) times a week.  . fenofibrate 160 MG tablet Take 1 tablet (160 mg total) by mouth daily.  Marland Kitchen latanoprost (XALATAN) 0.005 % ophthalmic solution INSTILL 1 DROP AT BEDTIME INTO BOTH EYES  . meclizine (ANTIVERT) 25 MG tablet Take 1 tablet (25 mg total) by mouth 2 (two) times daily as needed for dizziness.  . metoprolol tartrate (LOPRESSOR) 25 MG tablet Take 1 tablet (25 mg total) by mouth 2 (two) times daily.  Marland Kitchen tetrahydrozoline (EYE DROPS) 0.05 % ophthalmic solution Place 1 drop daily into both eyes.   . Tiotropium Bromide  Monohydrate (SPIRIVA RESPIMAT) 2.5 MCG/ACT AERS Inhale 1 Inhaler into the lungs daily.  . [DISCONTINUED] ALPRAZolam (XANAX) 0.25 MG tablet Take 1 tablet (0.25 mg total) by mouth at bedtime as needed for anxiety.   No facility-administered encounter medications on file as of 08/22/2018.     Surgical History: Past Surgical History:  Procedure Laterality Date  . APPENDECTOMY  1989  . COLONOSCOPY  2016  . OOPHORECTOMY  1989    Medical History: Past Medical History:  Diagnosis Date  . Anxiety   . 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 Gall stones   . Hematuria   . Hyperlipidemia   . Hypertension   . Reflux   . Sinusitis   . Vitamin D deficiency     Family History: Family History  Problem Relation Age of Onset  . Kidney failure Mother   . Cancer Father        Gastric  . Breast cancer Neg Hx   . Ovarian cancer Neg Hx   . Colon cancer Neg Hx   . Heart disease Neg Hx   . Diabetes Neg Hx       Review of Systems  Constitutional: Negative for activity change, chills, fatigue and unexpected weight change.  HENT: Negative for congestion, postnasal drip, rhinorrhea, sneezing and sore throat.   Respiratory: Positive for wheezing. Negative for cough, chest tightness and shortness of breath.        Intermittent and well controlled.   Cardiovascular: Negative for chest pain and palpitations.  Gastrointestinal: Negative for abdominal  pain, constipation, diarrhea, nausea and vomiting.  Endocrine: Negative for cold intolerance, heat intolerance, polydipsia and polyuria.  Musculoskeletal: Negative for arthralgias, back pain, joint swelling, myalgias and neck pain.  Skin: Negative for rash.  Neurological: Negative for dizziness and headaches.  Hematological: Negative for adenopathy. Does not bruise/bleed easily.  Psychiatric/Behavioral: Negative for behavioral problems (Depression), sleep disturbance and suicidal ideas. The patient is nervous/anxious.      Today's Vitals   08/22/18 1010  BP:  138/72  Pulse: (!) 58  Resp: 16  SpO2: 96%  Weight: 128 lb 3.2 oz (58.2 kg)  Height: 5\' 5"  (1.651 m)   Body mass index is 21.33 kg/m.  Physical Exam Vitals signs and nursing note reviewed.  Constitutional:      General: She is not in acute distress.    Appearance: Normal appearance. She is well-developed. She is not diaphoretic.  HENT:     Head: Normocephalic and atraumatic.     Right Ear: Tympanic membrane is not erythematous or bulging.     Left Ear: Tympanic membrane is not erythematous or bulging.     Nose: Nose normal. No rhinorrhea.     Right Sinus: No maxillary sinus tenderness or frontal sinus tenderness.     Left Sinus: No maxillary sinus tenderness or frontal sinus tenderness.     Mouth/Throat:     Pharynx: No oropharyngeal exudate or posterior oropharyngeal erythema.  Eyes:     Conjunctiva/sclera: Conjunctivae normal.     Pupils: Pupils are equal, round, and reactive to light.  Neck:     Musculoskeletal: Normal range of motion and neck supple.     Thyroid: No thyromegaly.     Vascular: No carotid bruit or JVD.     Trachea: No tracheal deviation.  Cardiovascular:     Rate and Rhythm: Normal rate and regular rhythm.     Pulses: Normal pulses.     Heart sounds: Normal heart sounds. No murmur. No friction rub. No gallop.   Pulmonary:     Effort: Pulmonary effort is normal. No respiratory distress.     Breath sounds: Normal breath sounds. No wheezing or rales.  Chest:     Chest wall: No tenderness.  Abdominal:     General: Bowel sounds are normal.     Palpations: Abdomen is soft.     Tenderness: There is no abdominal tenderness.  Musculoskeletal: Normal range of motion.  Lymphadenopathy:     Cervical: No cervical adenopathy.  Skin:    General: Skin is warm and dry.     Capillary Refill: Capillary refill takes less than 2 seconds.  Neurological:     General: No focal deficit present.     Mental Status: She is alert and oriented to person, place, and time.  Mental status is at baseline.     Cranial Nerves: No cranial nerve deficit.  Psychiatric:        Behavior: Behavior normal.        Thought Content: Thought content normal.        Judgment: Judgment normal.    Depression screen [facility] 2/9 08/22/2018 07/01/2018 04/24/2018 02/11/2018 01/17/2018  Decreased Interest 0 0 0 0 0  Down, Depressed, Hopeless 0 0 0 0 0  PHQ - 2 Score 0 0 0 0 0    Functional Status Survey: Is the patient deaf or have difficulty hearing?: No Does the patient have difficulty seeing, even when wearing glasses/contacts?: No Does the patient have difficulty concentrating, remembering, or making decisions?: No Does the  patient have difficulty walking or climbing stairs?: No Does the patient have difficulty dressing or bathing?: No Does the patient have difficulty doing errands alone such as visiting a doctor's office or shopping?: No  MMSE - Mini Mental State Exam 08/22/2018  Orientation to time 5  Orientation to Place 5  Registration 3  Attention/ Calculation 5  Recall 3  Language- name 2 objects 2  Language- repeat 1  Language- follow 3 step command 3  Language- read & follow direction 1  Write a sentence 1  Copy design 1  Total score 30    Fall Risk  08/22/2018 07/01/2018 04/24/2018 02/11/2018 01/17/2018  Falls in the past year? 0 0 0 0 0  Number falls in past yr: - - - 0 -  Injury with Fall? - - - 0 -      LABS: Recent Results (from the past 2160 hour(s))  UA/M w/rflx Culture, Routine     Status: None   Collection Time: 08/22/18 10:11 AM   Specimen: Urine   URINE  Result Value Ref Range   Specific Gravity, UA 1.009 1.005 - 1.030   pH, UA 7.0 5.0 - 7.5   Color, UA Yellow Yellow   Appearance Ur Clear Clear   Leukocytes,UA Negative Negative   Protein,UA Negative Negative/Trace   Glucose, UA Negative Negative   Ketones, UA Negative Negative   RBC, UA Negative Negative   Bilirubin, UA Negative Negative   Urobilinogen, Ur 0.2 0.2 - 1.0 mg/dL   Nitrite, UA  Negative Negative   Microscopic Examination Comment     Comment: Microscopic follows if indicated.   Microscopic Examination See below:     Comment: Microscopic was indicated and was performed.   Urinalysis Reflex Comment     Comment: This specimen will not reflex to a Urine Culture.  Microscopic Examination     Status: None   Collection Time: 08/22/18 10:11 AM   URINE  Result Value Ref Range   WBC, UA 0-5 0 - 5 /hpf   RBC 0-2 0 - 2 /hpf   Epithelial Cells (non renal) 0-10 0 - 10 /hpf   Casts None seen None seen /lpf   Mucus, UA Present Not Estab.   Bacteria, UA None seen None seen/Few  POCT Urine Drug Screen     Status: None   Collection Time: 08/22/18 10:28 AM  Result Value Ref Range   POC METHAMPHETAMINE UR None Detected None Detected   POC Opiate Ur None Detected None Detected   POC Barbiturate UR None Detected None Detected   POC Amphetamine UR None Detected None Detected   POC Oxycodone UR None Detected None Detected   POC Cocaine UR None Detected None Detected   POC Ecstasy UR None Detected None Detected   POC TRICYCLICS UR None Detected None Detected   POC PHENCYCLIDINE UR None Detected None Detected   POC MARIJUANA UR None Detected None Detected   POC METHADONE UR None Detected None Detected   POC BENZODIAZEPINES UR None Detected None Detected   URINE TEMPERATURE     POC DRUG SCREEN OXIDANTS URINE     POC SPECIFIC GRAVITY URINE     POC PH URINE     Methylenedioxyamphetamine     Assessment/Plan: 1. Encounter for general adult medical examination with abnormal findings Annual health maintenance exam today - CBC with Differential/Platelet; Future - TSH; Future - TSH - CBC with Differential/Platelet  2. Essential hypertension Stable. Continue bp medication as prescribed. Routine, fasting labs ordered  -  CBC with Differential/Platelet; Future - Comprehensive metabolic panel; Future - Lipid panel; Future - Lipid panel - Comprehensive metabolic panel - CBC with  Differential/Platelet  3. Chronic obstructive pulmonary disease, unspecified COPD type ([place]) Continue with inhalers as prescribed   4. Vitamin D deficiency - Vitamin D 1,25 dihydroxy; Future - Vitamin D 1,25 dihydroxy  5. Generalized anxiety disorder New prescription alprazolam 0.25mg  given. May take at bedtime as needed for insomnia/anxiety.  - ALPRAZolam (XANAX) 0.25 MG tablet; Take 1 tablet (0.25 mg total) by mouth at bedtime as needed for anxiety.  Dispense: 30 tablet; Refill: 2  6. Encounter for long-term (current) use of medications UDS negative for all controlled substances. Appropriate as she takes this rarely.  - POCT Urine Drug Screen - Comprehensive metabolic panel; Future - T4, free; Future - T4, free - Comprehensive metabolic panel  7. Dysuria - UA/M w/rflx Culture, Routine  General Counseling: Lakeya verbalizes understanding of the findings of todays visit and agrees with plan of treatment. I have discussed any further diagnostic evaluation that may be needed or ordered today. We also reviewed her medications today. she has been encouraged to call the office with any questions or concerns that should arise related to todays visit.    Counseling:  This patient was seen by Leretha Pol FNP Collaboration with Dr Lavera Guise as a part of collaborative care agreement  Orders Placed This Encounter  Procedures  . Microscopic Examination  . UA/M w/rflx Culture, Routine  . CBC with Differential/Platelet  . Comprehensive metabolic panel  . T4, free  . TSH  . Lipid panel  . Vitamin D 1,25 dihydroxy  . POCT Urine Drug Screen    Meds ordered this encounter  Medications  . ALPRAZolam (XANAX) 0.25 MG tablet    Sig: Take 1 tablet (0.25 mg total) by mouth at bedtime as needed for anxiety.    Dispense:  30 tablet    Refill:  2    Order Specific Question:   Supervising Provider    Answer:   Lavera Guise [[id number]]    Time spent: [place],  MD  Internal Medicine

## 2018-08-22 NOTE — Progress Notes (Signed)
Pt blood pressure elevated and pulse low,informed provider.

## 2018-08-23 LAB — UA/M W/RFLX CULTURE, ROUTINE
Bilirubin, UA: NEGATIVE
Glucose, UA: NEGATIVE
Ketones, UA: NEGATIVE
Leukocytes,UA: NEGATIVE
Nitrite, UA: NEGATIVE
Protein,UA: NEGATIVE
RBC, UA: NEGATIVE
Specific Gravity, UA: 1.009 (ref 1.005–1.030)
Urobilinogen, Ur: 0.2 mg/dL (ref 0.2–1.0)
pH, UA: 7 (ref 5.0–7.5)

## 2018-08-23 LAB — MICROSCOPIC EXAMINATION
Bacteria, UA: NONE SEEN
Casts: NONE SEEN /lpf

## 2018-08-28 DIAGNOSIS — E559 Vitamin D deficiency, unspecified: Secondary | ICD-10-CM | POA: Insufficient documentation

## 2018-08-28 DIAGNOSIS — R300 Dysuria: Secondary | ICD-10-CM | POA: Insufficient documentation

## 2018-08-28 DIAGNOSIS — Z79899 Other long term (current) drug therapy: Secondary | ICD-10-CM | POA: Insufficient documentation

## 2018-09-02 ENCOUNTER — Other Ambulatory Visit: Payer: Self-pay

## 2018-09-02 DIAGNOSIS — J4520 Mild intermittent asthma, uncomplicated: Secondary | ICD-10-CM

## 2018-09-02 MED ORDER — SPIRIVA RESPIMAT 2.5 MCG/ACT IN AERS: 1.0000 | INHALATION_SPRAY | Freq: Every day | RESPIRATORY_TRACT | 3 refills | Status: DC

## 2018-10-01 ENCOUNTER — Other Ambulatory Visit: Payer: Self-pay | Admitting: Adult Health

## 2018-10-03 ENCOUNTER — Ambulatory Visit: Payer: PPO | Admitting: Internal Medicine

## 2018-10-22 ENCOUNTER — Other Ambulatory Visit: Payer: Self-pay

## 2018-10-22 ENCOUNTER — Encounter: Payer: Self-pay | Admitting: Obstetrics and Gynecology

## 2018-10-22 ENCOUNTER — Ambulatory Visit (INDEPENDENT_AMBULATORY_CARE_PROVIDER_SITE_OTHER): Payer: PPO | Admitting: Obstetrics and Gynecology

## 2018-10-22 VITALS — BP 137/75 | HR 84 | Ht 65.0 in | Wt 126.1 lb

## 2018-10-22 DIAGNOSIS — Z01419 Encounter for gynecological examination (general) (routine) without abnormal findings: Secondary | ICD-10-CM | POA: Diagnosis not present

## 2018-10-22 DIAGNOSIS — N952 Postmenopausal atrophic vaginitis: Secondary | ICD-10-CM

## 2018-10-22 DIAGNOSIS — Z1231 Encounter for screening mammogram for malignant neoplasm of breast: Secondary | ICD-10-CM | POA: Diagnosis not present

## 2018-10-22 DIAGNOSIS — Z780 Asymptomatic menopausal state: Secondary | ICD-10-CM | POA: Diagnosis not present

## 2018-10-22 DIAGNOSIS — Z1211 Encounter for screening for malignant neoplasm of colon: Secondary | ICD-10-CM

## 2018-10-22 MED ORDER — PREMARIN 0.625 MG/GM VA CREA: 0.2500 | TOPICAL_CREAM | VAGINAL | 6 refills | Status: AC

## 2018-10-22 NOTE — Progress Notes (Signed)
Pt present for annual exam. Pt stated that she is doing well no problems. Pt declined flu vaccine.

## 2018-10-22 NOTE — Patient Instructions (Addendum)
Health Maintenance for Postmenopausal Women Menopause is a normal process in which your ability to get pregnant comes to an end. This process happens slowly over many months or years, usually between the ages of 48 and 55. Menopause is complete when you have missed your menstrual periods for 12 months. It is important to talk with your health care provider about some of the most common conditions that affect women after menopause (postmenopausal women). These include heart disease, cancer, and bone loss (osteoporosis). Adopting a healthy lifestyle and getting preventive care can help to promote your health and wellness. The actions you take can also lower your chances of developing some of these common conditions. What should I know about menopause? During menopause, you may get a number of symptoms, such as:  Hot flashes. These can be moderate or severe.  Night sweats.  Decrease in sex drive.  Mood swings.  Headaches.  Tiredness.  Irritability.  Memory problems.  Insomnia. Choosing to treat or not to treat these symptoms is a decision that you make with your health care provider. Do I need hormone replacement therapy?  Hormone replacement therapy is effective in treating symptoms that are caused by menopause, such as hot flashes and night sweats.  Hormone replacement carries certain risks, especially as you become older. If you are thinking about using estrogen or estrogen with progestin, discuss the benefits and risks with your health care provider. What is my risk for heart disease and stroke? The risk of heart disease, heart attack, and stroke increases as you age. One of the causes may be a change in the body's hormones during menopause. This can affect how your body uses dietary fats, triglycerides, and cholesterol. Heart attack and stroke are medical emergencies. There are many things that you can do to help prevent heart disease and stroke. Watch your blood pressure  High  blood pressure causes heart disease and increases the risk of stroke. This is more likely to develop in people who have high blood pressure readings, are of African descent, or are overweight.  Have your blood pressure checked: ? Every 3-5 years if you are 18-39 years of age. ? Every year if you are 40 years old or older. Eat a healthy diet   Eat a diet that includes plenty of vegetables, fruits, low-fat dairy products, and lean protein.  Do not eat a lot of foods that are high in solid fats, added sugars, or sodium. Get regular exercise Get regular exercise. This is one of the most important things you can do for your health. Most adults should:  Try to exercise for at least 150 minutes each week. The exercise should increase your heart rate and make you sweat (moderate-intensity exercise).  Try to do strengthening exercises at least twice each week. Do these in addition to the moderate-intensity exercise.  Spend less time sitting. Even light physical activity can be beneficial. Other tips  Work with your health care provider to achieve or maintain a healthy weight.  Do not use any products that contain nicotine or tobacco, such as cigarettes, e-cigarettes, and chewing tobacco. If you need help quitting, ask your health care provider.  Know your numbers. Ask your health care provider to check your cholesterol and your blood sugar (glucose). Continue to have your blood tested as directed by your health care provider. Do I need screening for cancer? Depending on your health history and family history, you may need to have cancer screening at different stages of your life. This   may include screening for:  Breast cancer.  Cervical cancer.  Lung cancer.  Colorectal cancer. What is my risk for osteoporosis? After menopause, you may be at increased risk for osteoporosis. Osteoporosis is a condition in which bone destruction happens more quickly than new bone creation. To help prevent  osteoporosis or the bone fractures that can happen because of osteoporosis, you may take the following actions:  If you are 19-50 years old, get at least 1,000 mg of calcium and at least 600 mg of vitamin D per day.  If you are older than age 50 but younger than age 70, get at least 1,200 mg of calcium and at least 600 mg of vitamin D per day.  If you are older than age 70, get at least 1,200 mg of calcium and at least 800 mg of vitamin D per day. Smoking and drinking excessive alcohol increase the risk of osteoporosis. Eat foods that are rich in calcium and vitamin D, and do weight-bearing exercises several times each week as directed by your health care provider. How does menopause affect my mental health? Depression may occur at any age, but it is more common as you become older. Common symptoms of depression include:  Low or sad mood.  Changes in sleep patterns.  Changes in appetite or eating patterns.  Feeling an overall lack of motivation or enjoyment of activities that you previously enjoyed.  Frequent crying spells. Talk with your health care provider if you think that you are experiencing depression. General instructions See your health care provider for regular wellness exams and vaccines. This may include:  Scheduling regular health, dental, and eye exams.  Getting and maintaining your vaccines. These include: ? Influenza vaccine. Get this vaccine each year before the flu season begins. ? Pneumonia vaccine. ? Shingles vaccine. ? Tetanus, diphtheria, and pertussis (Tdap) booster vaccine. Your health care provider may also recommend other immunizations. Tell your health care provider if you have ever been abused or do not feel safe at home. Summary  Menopause is a normal process in which your ability to get pregnant comes to an end.  This condition causes hot flashes, night sweats, decreased interest in sex, mood swings, headaches, or lack of sleep.  Treatment for this  condition may include hormone replacement therapy.  Take actions to keep yourself healthy, including exercising regularly, eating a healthy diet, watching your weight, and checking your blood pressure and blood sugar levels.  Get screened for cancer and depression. Make sure that you are up to date with all your vaccines. This information is not intended to replace advice given to you by your health care provider. Make sure you discuss any questions you have with your health care provider. Document Released: 03/10/2005 Document Revised: 01/09/2018 Document Reviewed: 01/09/2018 Elsevier Patient Education  2020 [employer]   Breast Self-Awareness Breast self-awareness is knowing how your breasts look and feel. Doing breast self-awareness is important. It allows you to catch a breast problem early while it is still small and can be treated. All women should do breast self-awareness, including women who have had breast implants. Tell your doctor if you notice a change in your breasts. What you need:  A mirror.  A well-lit room. How to do a breast self-exam A breast self-exam is one way to learn what is normal for your breasts and to check for changes. To do a breast self-exam: Look for changes  1. Take off all the clothes above your waist. 2. Stand in   front of a mirror in a room with good lighting. 3. Put your hands on your hips. 4. Push your hands down. 5. Look at your breasts and nipples in the mirror to see if one breast or nipple looks different from the other. Check to see if: ? The shape of one breast is different. ? The size of one breast is different. ? There are wrinkles, dips, and bumps in one breast and not the other. 6. Look at each breast for changes in the skin, such as: ? Redness. ? Scaly areas. 7. Look for changes in your nipples, such as: ? Liquid around the nipples. ? Bleeding. ? Dimpling. ? Redness. ? A change in where the nipples are. Feel for changes  1.  Lie on your back on the floor. 2. Feel each breast. To do this, follow these steps: ? Pick a breast to feel. ? Put the arm closest to that breast above your head. ? Use your other arm to feel the nipple area of your breast. Feel the area with the pads of your three middle fingers by making small circles with your fingers. For the first circle, press lightly. For the second circle, press harder. For the third circle, press even harder. ? Keep making circles with your fingers at the different pressures as you move down your breast. Stop when you feel your ribs. ? Move your fingers a little toward the center of your body. ? Start making circles with your fingers again, this time going up until you reach your collarbone. ? Keep making up-and-down circles until you reach your armpit. Remember to keep using the three pressures. ? Feel the other breast in the same way. 3. Sit or stand in the tub or shower. 4. With soapy water on your skin, feel each breast the same way you did in step 2 when you were lying on the floor. Write down what you find Writing down what you find can help you remember what to tell your doctor. Write down:  What is normal for each breast.  Any changes you find in each breast, including: ? The kind of changes you find. ? Whether you have pain. ? Size and location of any lumps.  When you last had your menstrual period. General tips  Check your breasts every month.  If you are breastfeeding, the best time to check your breasts is after you feed your baby or after you use a breast pump.  If you get menstrual periods, the best time to check your breasts is 5-7 days after your menstrual period is over.  With time, you will become comfortable with the self-exam, and you will begin to know if there are changes in your breasts. Contact a doctor if you:  See a change in the shape or size of your breasts or nipples.  See a change in the skin of your breast or nipples, such  as red or scaly skin.  Have fluid coming from your nipples that is not normal.  Find a lump or thick area that was not there before.  Have pain in your breasts.  Have any concerns about your breast health. Summary  Breast self-awareness includes looking for changes in your breasts, as well as feeling for changes within your breasts.  Breast self-awareness should be done in front of a mirror in a well-lit room.  You should check your breasts every month. If you get menstrual periods, the best time to check your breasts is 5-7 days  after your menstrual period is over.  Let your doctor know of any changes you see in your breasts, including changes in size, changes on the skin, pain or tenderness, or fluid from your nipples that is not normal. This information is not intended to replace advice given to you by your health care provider. Make sure you discuss any questions you have with your health care provider. Document Released: 07/05/2007 Document Revised: 09/04/2017 Document Reviewed: 09/04/2017 Elsevier Patient Education  2020 [place].    Influenza, Adult Influenza, more commonly known as "the flu," is a viral infection that mainly affects the respiratory tract. The respiratory tract includes organs that help you breathe, such as the lungs, nose, and throat. The flu causes many symptoms similar to the common cold along with high fever and body aches. The flu spreads easily from person to person (is contagious). Getting a flu shot (influenza vaccination) every year is the best way to prevent the flu. What are the causes? This condition is caused by the influenza virus. You can get the virus by:  Breathing in droplets that are in the air from an infected person's cough or sneeze.  Touching something that has been exposed to the virus (has been contaminated) and then touching your mouth, nose, or eyes. What increases the risk? The following factors may make you more likely to get  the flu:  Not washing or sanitizing your hands often.  Having close contact with many people during cold and flu season.  Touching your mouth, eyes, or nose without first washing or sanitizing your hands.  Not getting a yearly (annual) flu shot. You may have a higher risk for the flu, including serious problems such as a lung infection (pneumonia), if you:  Are older than 65.  Are pregnant.  Have a weakened disease-fighting system (immune system). You may have a weakened immune system if you: ? Have HIV or AIDS. ? Are undergoing chemotherapy. ? Are taking medicines that reduce (suppress) the activity of your immune system.  Have a long-term (chronic) illness, such as heart disease, kidney disease, diabetes, or lung disease.  Have a liver disorder.  Are severely overweight (morbidly obese).  Have anemia. This is a condition that affects your red blood cells.  Have asthma. What are the signs or symptoms? Symptoms of this condition usually begin suddenly and last 4-14 days. They may include:  Fever and chills.  Headaches, body aches, or muscle aches.  Sore throat.  Cough.  Runny or stuffy (congested) nose.  Chest discomfort.  Poor appetite.  Weakness or fatigue.  Dizziness.  Nausea or vomiting. How is this diagnosed? This condition may be diagnosed based on:  Your symptoms and medical history.  A physical exam.  Swabbing your nose or throat and testing the fluid for the influenza virus. How is this treated? If the flu is diagnosed early, you can be treated with medicine that can help reduce how severe the illness is and how long it lasts (antiviral medicine). This may be given by mouth (orally) or through an IV. Taking care of yourself at home can help relieve symptoms. Your health care provider may recommend:  Taking over-the-counter medicines.  Drinking plenty of fluids. In many cases, the flu goes away on its own. If you have severe symptoms or  complications, you may be treated in a hospital. Follow these instructions at home: Activity  Rest as needed and get plenty of sleep.  Stay home from work or school as told by  your health care provider. Unless you are visiting your health care provider, avoid leaving home until your fever has been gone for 24 hours without taking medicine. Eating and drinking  Take an oral rehydration solution (ORS). This is a drink that is sold at pharmacies and retail stores.  Drink enough fluid to keep your urine pale yellow.  Drink clear fluids in small amounts as you are able. Clear fluids include water, ice chips, diluted fruit juice, and low-calorie sports drinks.  Eat bland, easy-to-digest foods in small amounts as you are able. These foods include bananas, applesauce, rice, lean meats, toast, and crackers.  Avoid drinking fluids that contain a lot of sugar or caffeine, such as energy drinks, regular sports drinks, and soda.  Avoid alcohol.  Avoid spicy or fatty foods. General instructions      Take over-the-counter and prescription medicines only as told by your health care provider.  Use a cool mist humidifier to add humidity to the air in your home. This can make it easier to breathe.  Cover your mouth and nose when you cough or sneeze.  Wash your hands with soap and water often, especially after you cough or sneeze. If soap and water are not available, use alcohol-based hand sanitizer.  Keep all follow-up visits as told by your health care provider. This is important. How is this prevented?   Get an annual flu shot. You may get the flu shot in late summer, fall, or winter. Ask your health care provider when you should get your flu shot.  Avoid contact with people who are sick during cold and flu season. This is generally fall and winter. Contact a health care provider if:  You develop new symptoms.  You have: ? Chest pain. ? Diarrhea. ? A fever.  Your cough gets worse.   You produce more mucus.  You feel nauseous or you vomit. Get help right away if:  You develop shortness of breath or difficulty breathing.  Your skin or nails turn a bluish color.  You have severe pain or stiffness in your neck.  You develop a sudden headache or sudden pain in your face or ear.  You cannot eat or drink without vomiting. Summary  Influenza, more commonly known as "the flu," is a viral infection that primarily affects your respiratory tract.  Symptoms of the flu usually begin suddenly and last 4-14 days.  Getting an annual flu shot is the best way to prevent getting the flu.  Stay home from work or school as told by your health care provider. Unless you are visiting your health care provider, avoid leaving home until your fever has been gone for 24 hours without taking medicine.  Keep all follow-up visits as told by your health care provider. This is important. This information is not intended to replace advice given to you by your health care provider. Make sure you discuss any questions you have with your health care provider. Document Released: 01/14/2000 Document Revised: 04/18/2018 Document Reviewed: 07/04/2017 Elsevier Patient Education  2020 [employer].

## 2018-10-22 NOTE — Progress Notes (Addendum)
ANNUAL PREVENTATIVE CARE GYNECOLOGY  ENCOUNTER NOTE  Subjective:       Shari Gutierrez is a 68 y.o. G59P1001 female here for a routine annual gynecologic exam. The patient is sexually active. The patient is using local hormone replacement therapy. Patient denies post-menopausal vaginal bleeding. The patient wears seatbelts: yes. The patient participates in regular exercise: no. Has the patient ever been transfused or tattooed?: no. The patient reports that there is not domestic violence in her life.  Current complaints: 1. None   Gynecologic History No LMP recorded. Patient is postmenopausal. Contraception: post menopausal status Last Pap: 07/2015. Results were: normal Last mammogram: 03/2018. Results were: normal Last Colonoscopy:  She declined colonoscopy from PCP Last Dexa Scan: 09/2009. Results were: Osteopenia, T score of hip was -1.9.    Obstetric History OB History  Gravida Para Term Preterm AB Living  1 1 1     1   SAB TAB Ectopic Multiple Live Births          1    # Outcome Date GA Lbr Len/2nd Weight Sex Delivery Anes PTL Lv  1 Term 1978   6 lb (2.722 kg) F Vag-Spont   LIV    Obstetric Comments  1st Menstrual Cycle:  15  1st Pregnancy:  25    Past Medical History:  Diagnosis Date  . Anxiety   . 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 Gall stones   . Hematuria   . Hyperlipidemia   . Hypertension   . Reflux   . Sinusitis   . Vitamin D deficiency     Family History  Problem Relation Age of Onset  . Kidney failure Mother   . Cancer Father        Gastric  . Breast cancer Neg Hx   . Ovarian cancer Neg Hx   . Colon cancer Neg Hx   . Heart disease Neg Hx   . Diabetes Neg Hx     Past Surgical History:  Procedure Laterality Date  . APPENDECTOMY  1989  . COLONOSCOPY  2016  . OOPHORECTOMY  1989    Social History   Socioeconomic History  . Marital status: Married    Spouse name: Not on file  . Number of children: Not on file  . Years of education: Not on file  .  Highest education level: Not on file  Occupational History  . Not on file  Social Needs  . Financial resource strain: Not on file  . Food insecurity    Worry: Not on file    Inability: Not on file  . Transportation needs    Medical: Not on file    Non-medical: Not on file  Tobacco Use  . Smoking status: Current Every Day Smoker    Packs/day: 1.00    Years: 30.00    Pack years: 30.00    Types: Cigarettes    Start date: 07/28/1979  . Smokeless tobacco: Never Used  . Tobacco comment: pt is using a patch to help quit  Substance and Sexual Activity  . Alcohol use: Yes    Alcohol/week: 0.0 standard drinks    Comment: occasionally 1/2 beer  . Drug use: No  . Sexual activity: Yes    Birth control/protection: Post-menopausal  Lifestyle  . Physical activity    Days per week: 2 days    Minutes per session: 30 min  . Stress: Not on file  Relationships  . Social connections    Talks on phone: Not on file  Gets together: Not on file    Attends religious service: Not on file    Active member of club or organization: Not on file    Attends meetings of clubs or organizations: Not on file    Relationship status: Not on file  . Intimate partner violence    Fear of current or ex partner: Not on file    Emotionally abused: Not on file    Physically abused: Not on file    Forced sexual activity: Not on file  Other Topics Concern  . Not on file  Social History Narrative  . Not on file    Current Outpatient Medications on File Prior to Visit  Medication Sig Dispense Refill  . ALPRAZolam (XANAX) 0.25 MG tablet Take 1 tablet (0.25 mg total) by mouth at bedtime as needed for anxiety. 30 tablet 2  . budesonide-formoterol (SYMBICORT) 160-4.5 MCG/ACT inhaler Inhale 2 puffs into the lungs 2 (two) times daily. 1 Inhaler 5  . cholecalciferol (VITAMIN D) 1000 UNITS tablet Take 1,000 Units by mouth 2 (two) times daily.    Marland Kitchen conjugated estrogens (PREMARIN) vaginal cream Place [id number] Applicatorfuls  vaginally 2 (two) times a week. 42.5 g 12  . fenofibrate 160 MG tablet Take 1 tablet (160 mg total) by mouth daily. 90 tablet 1  . latanoprost (XALATAN) 0.005 % ophthalmic solution INSTILL 1 DROP AT BEDTIME INTO BOTH EYES  4  . meclizine (ANTIVERT) 25 MG tablet Take 1 tablet (25 mg total) by mouth 2 (two) times daily as needed for dizziness. 30 tablet 0  . metoprolol tartrate (LOPRESSOR) 25 MG tablet TAKE 1 TABLET BY MOUTH TWICE A DAY 180 tablet 1  . tetrahydrozoline (EYE DROPS) 0.05 % ophthalmic solution Place 1 drop daily into both eyes.     . Tiotropium Bromide Monohydrate (SPIRIVA RESPIMAT) 2.5 MCG/ACT AERS Inhale 1 Inhaler into the lungs daily. 4 g 3   No current facility-administered medications on file prior to visit.     No Known Allergies    Review of Systems ROS Review of Systems - General ROS: negative for - chills, fatigue, fever, hot flashes, night sweats, weight gain or weight loss Psychological ROS: negative for - anxiety, decreased libido, depression, mood swings, physical abuse or sexual abuse Ophthalmic ROS: negative for - blurry vision, eye pain or loss of vision ENT ROS: negative for - headaches, hearing change, visual changes or vocal changes Allergy and Immunology ROS: negative for - hives, itchy/watery eyes or seasonal allergies Hematological and Lymphatic ROS: negative for - bleeding problems, bruising, swollen lymph nodes or weight loss Endocrine ROS: negative for - galactorrhea, hair pattern changes, hot flashes, malaise/lethargy, mood swings, palpitations, polydipsia/polyuria, skin changes, temperature intolerance or unexpected weight changes Breast ROS: negative for - new or changing breast lumps or nipple discharge Respiratory ROS: negative for - cough or shortness of breath Cardiovascular ROS: negative for - chest pain, irregular heartbeat, palpitations or shortness of breath Gastrointestinal ROS: no abdominal pain, change in bowel habits, or black or bloody  stools Genito-Urinary ROS: no dysuria, trouble voiding, or hematuria Musculoskeletal ROS: negative for - joint pain or joint stiffness Neurological ROS: negative for - bowel and bladder control changes Dermatological ROS: negative for rash and skin lesion changes   Objective:   BP 137/75   Pulse 84   Ht 5\' 5"  (1.651 m)   Wt 126 lb 1.6 oz (57.2 kg)   BMI 20.98 kg/m  CONSTITUTIONAL: Well-developed, well-nourished female in no acute distress.  PSYCHIATRIC: Normal mood  and affect. Normal behavior. Normal judgment and thought content. [place]: Alert and oriented to person, place, and time. Normal muscle tone coordination. No cranial nerve deficit noted. HENT:  Normocephalic, atraumatic, External right and left ear normal. Oropharynx is clear and moist EYES: Conjunctivae and EOM are normal. Pupils are equal, round, and reactive to light. No scleral icterus.  NECK: Normal range of motion, supple, no masses.  Normal thyroid.  SKIN: Skin is warm and dry. No rash noted. Not diaphoretic. No erythema. No pallor. CARDIOVASCULAR: Normal heart rate noted, regular rhythm, no murmur. RESPIRATORY: Clear to auscultation bilaterally. Effort and breath sounds normal, no problems with respiration noted. BREASTS: Symmetric in size. No masses, skin changes, nipple drainage, or lymphadenopathy. ABDOMEN: Soft, normal bowel sounds, no distention noted.  No tenderness, rebound or guarding.  BLADDER: Normal PELVIC:  Bladder no bladder distension noted  Urethra: normal appearing urethra with no masses, tenderness or lesions  Vulva: normal appearing vulva with no masses, tenderness or lesions  Vagina: atrophic vaginal mucosa, no lesions or discharge.  Cervix: normal appearing cervix without discharge or lesions  Uterus: uterus is normal size, shape, consistency and nontender  Adnexa: normal adnexa in size, nontender and no masses  RV: External Exam NormaI, No Rectal Masses and Normal Sphincter tone   MUSCULOSKELETAL: Normal range of motion. No tenderness.  No cyanosis, clubbing, or edema.  2+ distal pulses. LYMPHATIC: No Axillary, Supraclavicular, or Inguinal Adenopathy.   Labs: Lab Results  Component Value Date   WBC 7.3 08/14/2017   HGB 13.5 08/14/2017   HCT 40.1 08/14/2017   MCV 96 08/14/2017   PLT 372 08/14/2017    Lab Results  Component Value Date   CREATININE 1.08 (H) 10/31/2017   BUN 14 10/31/2017   NA 139 10/31/2017   K 5.0 10/31/2017   CL 104 10/31/2017   CO2 23 10/31/2017    Lab Results  Component Value Date   ALT 15 08/14/2017   AST 27 08/14/2017   ALKPHOS 39 08/14/2017   BILITOT 0.3 08/14/2017    Lab Results  Component Value Date   CHOL 153 08/14/2017   HDL 28 (L) 08/14/2017   LDLCALC 108 (H) 08/14/2017   TRIG 85 08/14/2017    Lab Results  Component Value Date   TSH 2.360 08/14/2017    No results found for: HGBA1C   Assessment:   Well woman exam with routine gynecological exam Vaginal atrophy Menopause Breast cancer screening by mammogram Colon cancer screening  Plan:  Pap: Not needed.  Patient beyond age of screening.  Mammogram: Ordered Stool Guaiac Testing:  Ordered.  Labs: has appt with PCP next week.  Routine preventative health maintenance measures emphasized: Exercise/Diet/Weight control, Tobacco Warnings, Alcohol/Substance use risks, Stress Management, Peer Pressure Issues and Safe Sex Considering flu vaccine, may get at her PCP visit next week.  Refill given on Premarin cream, samples also given in office.  Return to [place], MD  [facility]

## 2018-10-28 ENCOUNTER — Ambulatory Visit: Payer: PPO | Admitting: Internal Medicine

## 2018-10-28 ENCOUNTER — Other Ambulatory Visit: Payer: Self-pay

## 2018-10-28 ENCOUNTER — Encounter: Payer: Self-pay | Admitting: Internal Medicine

## 2018-10-28 VITALS — BP 132/89 | HR 85 | Resp 16 | Ht 65.0 in | Wt 125.0 lb

## 2018-10-28 DIAGNOSIS — F17210 Nicotine dependence, cigarettes, uncomplicated: Secondary | ICD-10-CM | POA: Diagnosis not present

## 2018-10-28 DIAGNOSIS — R0602 Shortness of breath: Secondary | ICD-10-CM

## 2018-10-28 DIAGNOSIS — J449 Chronic obstructive pulmonary disease, unspecified: Secondary | ICD-10-CM

## 2018-10-28 DIAGNOSIS — J4520 Mild intermittent asthma, uncomplicated: Secondary | ICD-10-CM

## 2018-10-28 NOTE — Progress Notes (Signed)
[facility] [place], [place] [address]  Pulmonary Sleep Medicine   Office Visit Note  Patient Name: Shari Gutierrez DOB: 12/10/1950 MRN [id number]  Date of Service: 10/28/2018  Complaints/HPI: Pt is here for follow up on copd and asthma.  Overall she has no complaints and is doing well.  She denies any recent issues or hospitalizations.  She has been using her Spiriva daily, and her Symbicort inhaler as needed.    ROS  General: (-) fever, (-) chills, (-) night sweats, (-) weakness Skin: (-) rashes, (-) itching,. Eyes: (-) visual changes, (-) redness, (-) itching. Nose and Sinuses: (-) nasal stuffiness or itchiness, (-) postnasal drip, (-) nosebleeds, (-) sinus trouble. Mouth and Throat: (-) sore throat, (-) hoarseness. Neck: (-) swollen glands, (-) enlarged thyroid, (-) neck pain. Respiratory: - cough, (-) bloody sputum, - shortness of breath, - wheezing. Cardiovascular: - ankle swelling, (-) chest pain. Lymphatic: (-) lymph node enlargement. Neurologic: (-) numbness, (-) tingling. Psychiatric: (-) anxiety, (-) depression   Current Medication: Outpatient Encounter Medications as of 10/28/2018  Medication Sig  . ALPRAZolam (XANAX) 0.25 MG tablet Take 1 tablet (0.25 mg total) by mouth at bedtime as needed for anxiety.  . budesonide-formoterol (SYMBICORT) 160-4.5 MCG/ACT inhaler Inhale 2 puffs into the lungs 2 (two) times daily.  . cholecalciferol (VITAMIN D) 1000 UNITS tablet Take 1,000 Units by mouth 2 (two) times daily.  Marland Kitchen conjugated estrogens (PREMARIN) vaginal cream Place [id number] Applicatorfuls vaginally 2 (two) times a week.  . fenofibrate 160 MG tablet Take 1 tablet (160 mg total) by mouth daily.  Marland Kitchen latanoprost (XALATAN) 0.005 % ophthalmic solution INSTILL 1 DROP AT BEDTIME INTO BOTH EYES  . meclizine (ANTIVERT) 25 MG tablet Take 1 tablet (25 mg total) by mouth 2 (two) times daily as needed for dizziness.  . metoprolol tartrate (LOPRESSOR) 25 MG  tablet TAKE 1 TABLET BY MOUTH TWICE A DAY  . tetrahydrozoline (EYE DROPS) 0.05 % ophthalmic solution Place 1 drop daily into both eyes.   . Tiotropium Bromide Monohydrate (SPIRIVA RESPIMAT) 2.5 MCG/ACT AERS Inhale 1 Inhaler into the lungs daily.   No facility-administered encounter medications on file as of 10/28/2018.     Surgical History: Past Surgical History:  Procedure Laterality Date  . APPENDECTOMY  1989  . COLONOSCOPY  2016  . OOPHORECTOMY  1989    Medical History: Past Medical History:  Diagnosis Date  . Anxiety   . 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 Gall stones   . Hematuria   . Hyperlipidemia   . Hypertension   . Reflux   . Sinusitis   . Vitamin D deficiency     Family History: Family History  Problem Relation Age of Onset  . Kidney failure Mother   . Cancer Father        Gastric  . Breast cancer Neg Hx   . Ovarian cancer Neg Hx   . Colon cancer Neg Hx   . Heart disease Neg Hx   . Diabetes Neg Hx     Social History: Social History   Socioeconomic History  . Marital status: Married    Spouse name: Not on file  . Number of children: Not on file  . Years of education: Not on file  . Highest education level: Not on file  Occupational History  . Not on file  Social Needs  . Financial resource strain: Not on file  . Food insecurity    Worry: Not on file    Inability:  Not on file  . Transportation needs    Medical: Not on file    Non-medical: Not on file  Tobacco Use  . Smoking status: Current Every Day Smoker    Packs/day: 1.00    Years: 30.00    Pack years: 30.00    Types: Cigarettes    Start date: 07/28/1979  . Smokeless tobacco: Never Used  . Tobacco comment: pt is using a patch to help quit  Substance and Sexual Activity  . Alcohol use: Yes    Alcohol/week: 0.0 standard drinks    Comment: occasionally 1/2 beer  . Drug use: No  . Sexual activity: Yes    Birth control/protection: Post-menopausal  Lifestyle  . Physical activity    Days per week:  2 days    Minutes per session: 30 min  . Stress: Not on file  Relationships  . Social Herbalist on phone: Not on file    Gets together: Not on file    Attends religious service: Not on file    Active member of club or organization: Not on file    Attends meetings of clubs or organizations: Not on file    Relationship status: Not on file  . Intimate partner violence    Fear of current or ex partner: Not on file    Emotionally abused: Not on file    Physically abused: Not on file    Forced sexual activity: Not on file  Other Topics Concern  . Not on file  Social History Narrative  . Not on file    Vital Signs: Blood pressure 132/89, pulse 85, resp. rate 16, height 5\' 5"  (1.651 m), weight 125 lb (56.7 kg), SpO2 98 %.  Examination: General Appearance: The patient is well-developed, well-nourished, and in no distress. Skin: Gross inspection of skin unremarkable. Head: normocephalic, no gross deformities. Eyes: no gross deformities noted. ENT: ears appear grossly normal no exudates. Neck: Supple. No thyromegaly. No LAD. Respiratory: clear bilateraly. Cardiovascular: Normal S1 and S2 without murmur or rub. Extremities: No cyanosis. pulses are equal. Neurologic: Alert and oriented. No involuntary movements.  LABS: Recent Results (from the past 2160 hour(s))  UA/M w/rflx Culture, Routine     Status: None   Collection Time: 08/22/18 10:11 AM   Specimen: Urine   URINE  Result Value Ref Range   Specific Gravity, UA 1.009 1.005 - 1.030   pH, UA 7.0 5.0 - 7.5   Color, UA Yellow Yellow   Appearance Ur Clear Clear   Leukocytes,UA Negative Negative   Protein,UA Negative Negative/Trace   Glucose, UA Negative Negative   Ketones, UA Negative Negative   RBC, UA Negative Negative   Bilirubin, UA Negative Negative   Urobilinogen, Ur 0.2 0.2 - 1.0 mg/dL   Nitrite, UA Negative Negative   Microscopic Examination Comment     Comment: Microscopic follows if indicated.    Microscopic Examination See below:     Comment: Microscopic was indicated and was performed.   Urinalysis Reflex Comment     Comment: This specimen will not reflex to a Urine Culture.  Microscopic Examination     Status: None   Collection Time: 08/22/18 10:11 AM   URINE  Result Value Ref Range   WBC, UA 0-5 0 - 5 /hpf   RBC 0-2 0 - 2 /hpf   Epithelial Cells (non renal) 0-10 0 - 10 /hpf   Casts None seen None seen /lpf   Mucus, UA Present Not Estab.  Bacteria, UA None seen None seen/Few  POCT Urine Drug Screen     Status: None   Collection Time: 08/22/18 10:28 AM  Result Value Ref Range   POC METHAMPHETAMINE UR None Detected None Detected   POC Opiate Ur None Detected None Detected   POC Barbiturate UR None Detected None Detected   POC Amphetamine UR None Detected None Detected   POC Oxycodone UR None Detected None Detected   POC Cocaine UR None Detected None Detected   POC Ecstasy UR None Detected None Detected   POC TRICYCLICS UR None Detected None Detected   POC PHENCYCLIDINE UR None Detected None Detected   POC MARIJUANA UR None Detected None Detected   POC METHADONE UR None Detected None Detected   POC BENZODIAZEPINES UR None Detected None Detected   URINE TEMPERATURE     POC DRUG SCREEN OXIDANTS URINE     POC SPECIFIC GRAVITY URINE     POC [place] URINE     Methylenedioxyamphetamine      Radiology: Mm 3d Screen Breast Bilateral  Result Date: 04/01/2018 CLINICAL DATA:  Screening. EXAM: DIGITAL SCREENING BILATERAL MAMMOGRAM WITH TOMO AND CAD COMPARISON:  Previous exam(s). ACR Breast Density Category c: The breast tissue is heterogeneously dense, which may obscure small masses. FINDINGS: There are no findings suspicious for malignancy. Images were processed with CAD. IMPRESSION: No mammographic evidence of malignancy. A result letter of this screening mammogram will be mailed directly to the patient. RECOMMENDATION: Screening mammogram in one year. (Code:SM-B-01Y) BI-RADS CATEGORY   1: Negative. Electronically Signed   By: Fidela Salisbury M.D.   On: 04/01/2018 15:39    No results found.  No results found.    Assessment and Plan: Patient Active Problem List   Diagnosis Date Noted  . Encounter for long-term (current) use of medications 08/28/2018  . Vitamin D deficiency 08/28/2018  . Dysuria 08/28/2018  . Acute midline low back pain without sciatica 04/24/2018  . Acute recurrent sinusitis 02/11/2018  . Chronic obstructive pulmonary disease ([place]) 02/11/2018  . Nicotine dependence, cigarettes, uncomplicated [id number]  . Cough 10/15/2017  . Gallstones 10/15/2017  . Abnormal renal function 10/15/2017  . Vertigo 09/23/2017  . Generalized anxiety disorder 09/23/2017  . Hyperlipidemia 07/23/2017  . Mild intermittent asthma without complication [id number]  . Essential hypertension 04/23/2017  . Acute non-recurrent maxillary sinusitis 04/23/2017  . Menopause 07/26/2016  . Vaginal atrophy 07/26/2016  . Female dyspareunia 07/26/2016  . Microscopic hematuria 07/28/2014    1. Chronic obstructive pulmonary disease, unspecified COPD type ([place]) Get updated PFT's on patient. Continue to follow up as scheduled.  - Pulmonary Function Test; Future  2. Mild intermittent asthma without complication Continue to use inhalers as directed.   3. Nicotine dependence, cigarettes, uncomplicated Smoking cessation counseling: 1. Pt acknowledges the risks of long term smoking, she will try to quite smoking. 2. Options for different medications including nicotine products, chewing gum, patch etc, Wellbutrin and Chantix is discussed 3. Goal and date of compete cessation is discussed 4. Total time spent in smoking cessation is 15 min.  4. SOB (shortness of breath) - Spirometry with Graph  General Counseling: I have discussed the findings of the evaluation and examination with Nannie.  I have also discussed any further diagnostic evaluation thatmay be needed or ordered today.  Nicoletta verbalizes understanding of the findings of todays visit. We also reviewed her medications today and discussed drug interactions and side effects including but not limited excessive drowsiness and altered mental states. We also discussed  that there is always a risk not just to her but also people around her. she has been encouraged to call the office with any questions or concerns that should arise related to todays visit.    Time spent: 25 This patient was seen by Orson Gear AGNP-C in Collaboration with Dr. Devona Konig as a part of collaborative care agreement.   I have personally obtained a history, examined the patient, evaluated laboratory and imaging results, formulated the assessment and plan and placed orders.    Allyne Gee, MD [facility] Pulmonary and Critical Care Sleep medicine

## 2018-12-03 DIAGNOSIS — H40153 Residual stage of open-angle glaucoma, bilateral: Secondary | ICD-10-CM | POA: Diagnosis not present

## 2018-12-04 DIAGNOSIS — L821 Other seborrheic keratosis: Secondary | ICD-10-CM | POA: Diagnosis not present

## 2018-12-04 DIAGNOSIS — D2261 Melanocytic nevi of right upper limb, including shoulder: Secondary | ICD-10-CM | POA: Diagnosis not present

## 2018-12-04 DIAGNOSIS — L578 Other skin changes due to chronic exposure to nonionizing radiation: Secondary | ICD-10-CM | POA: Diagnosis not present

## 2018-12-04 DIAGNOSIS — D2262 Melanocytic nevi of left upper limb, including shoulder: Secondary | ICD-10-CM | POA: Diagnosis not present

## 2018-12-04 DIAGNOSIS — D225 Melanocytic nevi of trunk: Secondary | ICD-10-CM | POA: Diagnosis not present

## 2018-12-04 DIAGNOSIS — Z1283 Encounter for screening for malignant neoplasm of skin: Secondary | ICD-10-CM | POA: Diagnosis not present

## 2018-12-04 DIAGNOSIS — D1800 Hemangioma unspecified site: Secondary | ICD-10-CM | POA: Diagnosis not present

## 2018-12-04 DIAGNOSIS — L814 Other melanin hyperpigmentation: Secondary | ICD-10-CM | POA: Diagnosis not present

## 2018-12-16 ENCOUNTER — Other Ambulatory Visit: Payer: Self-pay | Admitting: Adult Health

## 2018-12-20 ENCOUNTER — Telehealth: Payer: Self-pay

## 2018-12-20 NOTE — Telephone Encounter (Signed)
Confirmed appointment with patient. klh °

## 2018-12-24 ENCOUNTER — Encounter: Payer: Self-pay | Admitting: Adult Health

## 2018-12-24 ENCOUNTER — Other Ambulatory Visit: Payer: Self-pay | Admitting: Adult Health

## 2018-12-24 ENCOUNTER — Other Ambulatory Visit: Payer: Self-pay

## 2018-12-24 ENCOUNTER — Encounter (INDEPENDENT_AMBULATORY_CARE_PROVIDER_SITE_OTHER): Payer: Self-pay

## 2018-12-24 ENCOUNTER — Ambulatory Visit (INDEPENDENT_AMBULATORY_CARE_PROVIDER_SITE_OTHER): Payer: PPO | Admitting: Adult Health

## 2018-12-24 VITALS — BP 121/51 | HR 85 | Temp 96.0°F | Resp 16 | Ht 65.0 in | Wt 127.0 lb

## 2018-12-24 DIAGNOSIS — J449 Chronic obstructive pulmonary disease, unspecified: Secondary | ICD-10-CM | POA: Diagnosis not present

## 2018-12-24 DIAGNOSIS — I10 Essential (primary) hypertension: Secondary | ICD-10-CM

## 2018-12-24 DIAGNOSIS — F17210 Nicotine dependence, cigarettes, uncomplicated: Secondary | ICD-10-CM | POA: Diagnosis not present

## 2018-12-24 DIAGNOSIS — F411 Generalized anxiety disorder: Secondary | ICD-10-CM

## 2018-12-24 DIAGNOSIS — Z79899 Other long term (current) drug therapy: Secondary | ICD-10-CM

## 2018-12-24 DIAGNOSIS — R42 Dizziness and giddiness: Secondary | ICD-10-CM | POA: Diagnosis not present

## 2018-12-24 DIAGNOSIS — J4520 Mild intermittent asthma, uncomplicated: Secondary | ICD-10-CM | POA: Diagnosis not present

## 2018-12-24 LAB — POCT URINE DRUG SCREEN
POC Amphetamine UR: NOT DETECTED
POC BENZODIAZEPINES UR: NOT DETECTED
POC Barbiturate UR: NOT DETECTED
POC Cocaine UR: NOT DETECTED
POC Ecstasy UR: NOT DETECTED
POC Marijuana UR: NOT DETECTED
POC Methadone UR: NOT DETECTED
POC Methamphetamine UR: NOT DETECTED
POC Opiate Ur: NOT DETECTED
POC Oxycodone UR: NOT DETECTED
POC PHENCYCLIDINE UR: NOT DETECTED
POC TRICYCLICS UR: NOT DETECTED

## 2018-12-24 MED ORDER — MECLIZINE HCL 25 MG PO TABS: 25.0000 mg | ORAL_TABLET | Freq: Two times a day (BID) | ORAL | 0 refills | Status: DC | PRN

## 2018-12-24 MED ORDER — METOPROLOL TARTRATE 25 MG PO TABS: 25.0000 mg | ORAL_TABLET | Freq: Two times a day (BID) | ORAL | 2 refills | Status: DC

## 2018-12-24 NOTE — Progress Notes (Signed)
[facility] [place], [place] [address]  Internal MEDICINE  Office Visit Note  Patient Name: Shari Gutierrez  [id number]  [id number]  Date of Service: 12/24/2018  Chief Complaint  Patient presents with  . Medical Management of Chronic Issues  . Anxiety  . Hyperlipidemia  . Hypertension    HPI  Pt is here for follow up on anxiety, HLD and HTN. She has not been able to get her labs drawn yet.  She reports she is doing well, denies any current complaints.  She reports her anxiety is well controlled at this time.  She has not taken xanax in some time. She has had some issues with vertigo recently, and would like a refill on his meclizine at this time.    Current Medication: Outpatient Encounter Medications as of 12/24/2018  Medication Sig  . ALPRAZolam (XANAX) 0.25 MG tablet Take 1 tablet (0.25 mg total) by mouth at bedtime as needed for anxiety.  . budesonide-formoterol (SYMBICORT) 160-4.5 MCG/ACT inhaler Inhale 2 puffs into the lungs 2 (two) times daily.  . cholecalciferol (VITAMIN D) 1000 UNITS tablet Take 1,000 Units by mouth 2 (two) times daily.  Marland Kitchen conjugated estrogens (PREMARIN) vaginal cream Place [id number] Applicatorfuls vaginally 2 (two) times a week.  . fenofibrate 160 MG tablet TAKE 1 TABLET BY MOUTH EVERY DAY  . latanoprost (XALATAN) 0.005 % ophthalmic solution INSTILL 1 DROP AT BEDTIME INTO BOTH EYES  . meclizine (ANTIVERT) 25 MG tablet Take 1 tablet (25 mg total) by mouth 2 (two) times daily as needed for dizziness.  . metoprolol tartrate (LOPRESSOR) 25 MG tablet Take 1 tablet (25 mg total) by mouth 2 (two) times daily.  Marland Kitchen tetrahydrozoline (EYE DROPS) 0.05 % ophthalmic solution Place 1 drop into both eyes daily.   . Tiotropium Bromide Monohydrate (SPIRIVA RESPIMAT) 2.5 MCG/ACT AERS Inhale 1 Inhaler into the lungs daily.  . [DISCONTINUED] metoprolol tartrate (LOPRESSOR) 25 MG tablet TAKE 1 TABLET BY MOUTH TWICE A DAY   No facility-administered  encounter medications on file as of 12/24/2018.     Surgical History: Past Surgical History:  Procedure Laterality Date  . APPENDECTOMY  1989  . COLONOSCOPY  2016  . OOPHORECTOMY  1989    Medical History: Past Medical History:  Diagnosis Date  . Anxiety   . 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 Gall stones   . Hematuria   . Hyperlipidemia   . Hypertension   . Reflux   . Sinusitis   . Vitamin D deficiency     Family History: Family History  Problem Relation Age of Onset  . Kidney failure Mother   . Cancer Father        Gastric  . Breast cancer Neg Hx   . Ovarian cancer Neg Hx   . Colon cancer Neg Hx   . Heart disease Neg Hx   . Diabetes Neg Hx     Social History   Socioeconomic History  . Marital status: Married    Spouse name: Not on file  . Number of children: Not on file  . Years of education: Not on file  . Highest education level: Not on file  Occupational History  . Not on file  Social Needs  . Financial resource strain: Not on file  . Food insecurity    Worry: Not on file    Inability: Not on file  . Transportation needs    Medical: Not on file    Non-medical: Not on file  Tobacco Use  .  Smoking status: Current Every Day Smoker    Packs/day: 1.00    Years: 30.00    Pack years: 30.00    Types: Cigarettes    Start date: 07/28/1979  . Smokeless tobacco: Never Used  . Tobacco comment: pt is using a patch to help quit  Substance and Sexual Activity  . Alcohol use: Yes    Alcohol/week: 0.0 standard drinks    Comment: occasionally 1/2 beer  . Drug use: No  . Sexual activity: Yes    Birth control/protection: Post-menopausal  Lifestyle  . Physical activity    Days per week: 2 days    Minutes per session: 30 min  . Stress: Not on file  Relationships  . Social Herbalist on phone: Not on file    Gets together: Not on file    Attends religious service: Not on file    Active member of club or organization: Not on file    Attends meetings of clubs  or organizations: Not on file    Relationship status: Not on file  . Intimate partner violence    Fear of current or ex partner: Not on file    Emotionally abused: Not on file    Physically abused: Not on file    Forced sexual activity: Not on file  Other Topics Concern  . Not on file  Social History Narrative  . Not on file      Review of Systems  Constitutional: Negative for chills, fatigue and unexpected weight change.  HENT: Negative for congestion, rhinorrhea, sneezing and sore throat.   Eyes: Negative for photophobia, pain and redness.  Respiratory: Negative for cough, chest tightness and shortness of breath.   Cardiovascular: Negative for chest pain and palpitations.  Gastrointestinal: Negative for abdominal pain, constipation, diarrhea, nausea and vomiting.  Endocrine: Negative.   Genitourinary: Negative for dysuria and frequency.  Musculoskeletal: Negative for arthralgias, back pain, joint swelling and neck pain.  Skin: Negative for rash.  Allergic/Immunologic: Negative.   Neurological: Negative for tremors and numbness.  Hematological: Negative for adenopathy. Does not bruise/bleed easily.  Psychiatric/Behavioral: Negative for behavioral problems and sleep disturbance. The patient is not nervous/anxious.     Vital Signs: BP (!) 121/51   Pulse 85   Temp (!) 96 F (35.6 C)   Resp 16   Ht 5\' 5"  (1.651 m)   Wt 127 lb (57.6 kg)   SpO2 97%   BMI 21.13 kg/m    Physical Exam Vitals signs and nursing note reviewed.  Constitutional:      General: She is not in acute distress.    Appearance: She is well-developed. She is not diaphoretic.  HENT:     Head: Normocephalic and atraumatic.     Mouth/Throat:     Pharynx: No oropharyngeal exudate.  Eyes:     Pupils: Pupils are equal, round, and reactive to light.  Neck:     Musculoskeletal: Normal range of motion and neck supple.     Thyroid: No thyromegaly.     Vascular: No JVD.     Trachea: No tracheal deviation.   Cardiovascular:     Rate and Rhythm: Normal rate and regular rhythm.     Heart sounds: Normal heart sounds. No murmur. No friction rub. No gallop.   Pulmonary:     Effort: Pulmonary effort is normal. No respiratory distress.     Breath sounds: Normal breath sounds. No wheezing or rales.  Chest:     Chest wall: No  tenderness.  Abdominal:     Palpations: Abdomen is soft.     Tenderness: There is no abdominal tenderness. There is no guarding.  Musculoskeletal: Normal range of motion.  Lymphadenopathy:     Cervical: No cervical adenopathy.  Skin:    General: Skin is warm and dry.  Neurological:     Mental Status: She is alert and oriented to person, place, and time.     Cranial Nerves: No cranial nerve deficit.  Psychiatric:        Behavior: Behavior normal.        Thought Content: Thought content normal.        Judgment: Judgment normal.    Assessment/Plan: 1. Essential hypertension BP controlled, lower today at 121/51.  Continue present management. Denies Chest pain, Shortness of breath, palpitations, headache, or blurred vision.   2. Vertigo Refilled patients medication.  Follow up in clinic if symptoms fail to improve or worsen.  - meclizine (ANTIVERT) 25 MG tablet; Take 1 tablet (25 mg total) by mouth 2 (two) times daily as needed for dizziness.  Dispense: 30 tablet; Refill: 0  3. Chronic obstructive pulmonary disease, unspecified COPD type ([place]) Continue present management.   4. Mild intermittent asthma without complication Continue to use inhalers as needed.   5. Generalized anxiety disorder Stable, continue present management.  6. Nicotine dependence, cigarettes, uncomplicated Smoking cessation counseling: 1. Pt acknowledges the risks of long term smoking, she will try to quite smoking. 2. Options for different medications including nicotine products, chewing gum, patch etc, Wellbutrin and Chantix is discussed 3. Goal and date of compete cessation is  discussed 4. Total time spent in smoking cessation is 15 min.  7. High risk medication use - POCT Urine Drug Screen  General Counseling: Modesta verbalizes understanding of the findings of todays visit and agrees with plan of treatment. I have discussed any further diagnostic evaluation that may be needed or ordered today. We also reviewed her medications today. she has been encouraged to call the office with any questions or concerns that should arise related to todays visit.    Orders Placed This Encounter  Procedures  . POCT Urine Drug Screen    No orders of the defined types were placed in this encounter.   Time spent: 25 Minutes   This patient was seen by Orson Gear AGNP-C in Collaboration with Dr Lavera Guise as a part of collaborative care agreement     Kendell Bane AGNP-C Internal medicine

## 2018-12-30 ENCOUNTER — Telehealth: Payer: Self-pay

## 2018-12-30 NOTE — Telephone Encounter (Signed)
Confirmed appointment with patient. klh °

## 2019-01-01 ENCOUNTER — Ambulatory Visit: Payer: PPO | Admitting: Internal Medicine

## 2019-01-08 ENCOUNTER — Other Ambulatory Visit: Payer: Self-pay | Admitting: Adult Health

## 2019-01-08 ENCOUNTER — Other Ambulatory Visit: Payer: Self-pay

## 2019-01-08 DIAGNOSIS — R42 Dizziness and giddiness: Secondary | ICD-10-CM

## 2019-01-08 DIAGNOSIS — J4520 Mild intermittent asthma, uncomplicated: Secondary | ICD-10-CM

## 2019-01-08 MED ORDER — BUDESONIDE-FORMOTEROL FUMARATE 160-4.5 MCG/ACT IN AERO: 2.0000 | INHALATION_SPRAY | Freq: Two times a day (BID) | RESPIRATORY_TRACT | 5 refills | Status: DC

## 2019-02-05 ENCOUNTER — Telehealth: Payer: Self-pay

## 2019-02-05 ENCOUNTER — Ambulatory Visit: Payer: PPO | Admitting: Internal Medicine

## 2019-02-05 NOTE — Telephone Encounter (Signed)
Patient rescheduled appointment on 02/05/2019 to 03/05/2019. klh

## 2019-02-13 ENCOUNTER — Telehealth: Payer: Self-pay | Admitting: Emergency Medicine

## 2019-02-13 ENCOUNTER — Telehealth: Payer: Self-pay

## 2019-02-13 ENCOUNTER — Ambulatory Visit: Payer: Self-pay | Admitting: Surgery

## 2019-02-13 ENCOUNTER — Ambulatory Visit (INDEPENDENT_AMBULATORY_CARE_PROVIDER_SITE_OTHER): Payer: PPO | Admitting: Surgery

## 2019-02-13 ENCOUNTER — Other Ambulatory Visit: Payer: Self-pay

## 2019-02-13 ENCOUNTER — Encounter: Payer: Self-pay | Admitting: Surgery

## 2019-02-13 VITALS — BP 156/84 | HR 92 | Temp 97.2°F | Resp 14 | Ht 65.0 in | Wt 122.8 lb

## 2019-02-13 DIAGNOSIS — K8020 Calculus of gallbladder without cholecystitis without obstruction: Secondary | ICD-10-CM | POA: Diagnosis not present

## 2019-02-13 DIAGNOSIS — I10 Essential (primary) hypertension: Secondary | ICD-10-CM | POA: Diagnosis not present

## 2019-02-13 DIAGNOSIS — E559 Vitamin D deficiency, unspecified: Secondary | ICD-10-CM | POA: Diagnosis not present

## 2019-02-13 DIAGNOSIS — K8010 Calculus of gallbladder with chronic cholecystitis without obstruction: Secondary | ICD-10-CM

## 2019-02-13 DIAGNOSIS — Z0001 Encounter for general adult medical examination with abnormal findings: Secondary | ICD-10-CM | POA: Diagnosis not present

## 2019-02-13 DIAGNOSIS — Z79899 Other long term (current) drug therapy: Secondary | ICD-10-CM | POA: Diagnosis not present

## 2019-02-13 NOTE — Telephone Encounter (Signed)
Patient made aware of Ultrasound appointment :   02/19/19 at 9:00am at [facility]. Arrival 30 minutes prior to appointment. Nothing to EAT/DRINK after midnight.

## 2019-02-13 NOTE — Patient Instructions (Addendum)
You will be scheduled for an Ultrasound.  Our surgery scheduler will contact you to schedule your surgery. Please have the [facility] Sheet available when she calls you.   Due to Covid we are unable to schedule surgeries right away. Once these lifting restrictions are lifted our scheduler will contact you to schedule your surgery.   Cholelithiasis  Cholelithiasis is a form of gallbladder disease in which gallstones form in the gallbladder. The gallbladder is an organ that stores bile. Bile is made in the liver, and it helps to digest fats. Gallstones begin as small crystals and slowly grow into stones. They may cause no symptoms until the gallbladder tightens (contracts) and a gallstone is blocking the duct (gallbladder attack), which can cause pain. Cholelithiasis is also referred to as gallstones. There are two main types of gallstones:  Cholesterol stones. These are made of hardened cholesterol and are usually yellow-green in color. They are the most common type of gallstone. Cholesterol is a white, waxy, fat-like substance that is made in the liver.  Pigment stones. These are dark in color and are made of a red-yellow substance that forms when hemoglobin from red blood cells breaks down (bilirubin). What are the causes? This condition may be caused by an imbalance in the substances that bile is made of. This can happen if the bile:  Has too much bilirubin.  Has too much cholesterol.  Does not have enough bile salts. These salts help the body absorb and digest fats. In some cases, this condition can also be caused by the gallbladder not emptying completely or often enough. What increases the risk? The following factors may make you more likely to develop this condition:  Being female.  Having multiple pregnancies. Health care providers sometimes advise removing diseased gallbladders before future pregnancies.  Eating a diet that is heavy in fried foods, fat, and refined carbohydrates, like  white bread and white rice.  Being obese.  Being older than age 12.  Prolonged use of medicines that contain female hormones (estrogen).  Having diabetes mellitus.  Rapidly losing weight.  Having a family history of gallstones.  Being of [place] or [place] descent.  Having an intestinal disease such as Crohn disease.  Having metabolic syndrome.  Having cirrhosis.  Having severe types of anemia such as sickle cell anemia. What are the signs or symptoms? In most cases, there are no symptoms. These are known as silent gallstones. If a gallstone blocks the bile ducts, it can cause a gallbladder attack. The main symptom of a gallbladder attack is sudden pain in the upper right abdomen. The pain usually comes at night or after eating a large meal. The pain can last for one or several hours and can spread to the right shoulder or chest. If the bile duct is blocked for more than a few hours, it can cause infection or inflammation of the gallbladder, liver, or pancreas, which may cause:  Nausea.  Vomiting.  Abdominal pain that lasts for 5 hours or more.  Fever or chills.  Yellowing of the skin or the whites of the eyes (jaundice).  Dark urine.  Light-colored stools. How is this diagnosed? This condition may be diagnosed based on:  A physical exam.  Your medical history.  An ultrasound of your gallbladder.  CT scan.  MRI.  Blood tests to check for signs of infection or inflammation.  A scan of your gallbladder and bile ducts (biliary system) using nonharmful radioactive material and special cameras that can see the  radioactive material (cholescintigram). This test checks to see how your gallbladder contracts and whether bile ducts are blocked.  Inserting a small tube with a camera on the end (endoscope) through your mouth to inspect bile ducts and check for blockages (endoscopic retrograde cholangiopancreatogram). How is this treated? Treatment for gallstones  depends on the severity of the condition. Silent gallstones do not need treatment. If the gallstones cause a gallbladder attack or other symptoms, treatment may be required. Options for treatment include:  Surgery to remove the gallbladder (cholecystectomy). This is the most common treatment.  Medicines to dissolve gallstones. These are most effective at treating small gallstones. You may need to take medicines for up to 6-12 months.  Shock wave treatment (extracorporeal biliary lithotripsy). In this treatment, an ultrasound machine sends shock waves to the gallbladder to break gallstones into smaller pieces. These pieces can then be passed into the intestines or be dissolved by medicine. This is rarely used.  Removing gallstones through endoscopic retrograde cholangiopancreatogram. A small basket can be attached to the endoscope and used to capture and remove gallstones. Follow these instructions at home:  Take over-the-counter and prescription medicines only as told by your health care provider.  Maintain a healthy weight and follow a healthy diet. This includes: ? Reducing fatty foods, such as fried food. ? Reducing refined carbohydrates, like white bread and white rice. ? Increasing fiber. Aim for foods like almonds, fruit, and beans.  Keep all follow-up visits as told by your health care provider. This is important. Contact a health care provider if:  You think you have had a gallbladder attack.  You have been diagnosed with silent gallstones and you develop abdominal pain or indigestion. Get help right away if:  You have pain from a gallbladder attack that lasts for more than 2 hours.  You have abdominal pain that lasts for more than 5 hours.  You have a fever or chills.  You have persistent nausea and vomiting.  You develop jaundice.  You have dark urine or light-colored stools. Summary  Cholelithiasis (also called gallstones) is a form of gallbladder disease in which  gallstones form in the gallbladder.  This condition is caused by an imbalance in the substances that make up bile. This can happen if the bile has too much cholesterol, too much bilirubin, or not enough bile salts.  You are more likely to develop this condition if you are female, pregnant, using medicines with estrogen, obese, older than age 60, or have a family history of gallstones. You may also develop gallstones if you have diabetes, an intestinal disease, cirrhosis, or metabolic syndrome.  Treatment for gallstones depends on the severity of the condition. Silent gallstones do not need treatment.  If gallstones cause a gallbladder attack or other symptoms, treatment may be needed. The most common treatment is surgery to remove the gallbladder. This information is not intended to replace advice given to you by your health care provider. Make sure you discuss any questions you have with your health care provider. Document Revised: 12/29/2016 Document Reviewed: 10/03/2015 Elsevier Patient Education  2020 [employer].

## 2019-02-13 NOTE — H&P (View-Only) (Signed)
Patient ID: Shari Gutierrez, female   DOB: 1950/07/03, 69 y.o.   MRN: [id number]  Chief Complaint: Gallbladder issues in question.  History of Present Illness Shari Gutierrez is a 69 y.o. female with a prior surgical visit for the same.  At that time she had symptoms quite consistent with biliary disease.  She continues to report right upper quadrant pain which radiates to the back.  Often the pain is epigastric with penetration to the back.  She reports it is not always associated with fatty foods as it has been previously.  She reports her appetite is good, her bowel movements are normal.  She denies fevers chills nausea vomiting and diarrhea..  Past Medical History Past Medical History:  Diagnosis Date  . Anxiety   . 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 Gall stones   . Hematuria   . Hyperlipidemia   . Hypertension   . Reflux   . Sinusitis   . Vitamin D deficiency       Past Surgical History:  Procedure Laterality Date  . APPENDECTOMY  1989  . COLONOSCOPY  2016  . OOPHORECTOMY  1989    No Known Allergies  Current Outpatient Medications  Medication Sig Dispense Refill  . ALPRAZolam (XANAX) 0.25 MG tablet Take 1 tablet (0.25 mg total) by mouth at bedtime as needed for anxiety. 30 tablet 2  . budesonide-formoterol (SYMBICORT) 160-4.5 MCG/ACT inhaler Inhale 2 puffs into the lungs 2 (two) times daily. 1 Inhaler 5  . cholecalciferol (VITAMIN D) 1000 UNITS tablet Take 1,000 Units by mouth 2 (two) times daily.    Marland Kitchen conjugated estrogens (PREMARIN) vaginal cream Place [id number] Applicatorfuls vaginally 2 (two) times a week. 42.5 g 6  . fenofibrate 160 MG tablet TAKE 1 TABLET BY MOUTH EVERY DAY 90 tablet 1  . latanoprost (XALATAN) 0.005 % ophthalmic solution INSTILL 1 DROP AT BEDTIME INTO BOTH EYES  4  . meclizine (ANTIVERT) 25 MG tablet TAKE 1 TABLET (25 MG TOTAL) BY MOUTH 2 (TWO) TIMES DAILY AS NEEDED FOR DIZZINESS. 30 tablet 0  . metoprolol tartrate (LOPRESSOR) 25 MG tablet Take 1 tablet (25 mg  total) by mouth 2 (two) times daily. 180 tablet 2  . tetrahydrozoline (EYE DROPS) 0.05 % ophthalmic solution Place 1 drop into both eyes daily.     . Tiotropium Bromide Monohydrate (SPIRIVA RESPIMAT) 2.5 MCG/ACT AERS Inhale 1 Inhaler into the lungs daily. 4 g 3   No current facility-administered medications for this visit.    Family History Family History  Problem Relation Age of Onset  . Kidney failure Mother   . Cancer Father        Gastric  . Breast cancer Neg Hx   . Ovarian cancer Neg Hx   . Colon cancer Neg Hx   . Heart disease Neg Hx   . Diabetes Neg Hx       Social History Social History   Tobacco Use  . Smoking status: Current Every Day Smoker    Packs/day: 1.00    Years: 30.00    Pack years: 30.00    Types: Cigarettes    Start date: 07/28/1979  . Smokeless tobacco: Never Used  . Tobacco comment: pt is using a patch to help quit  Substance Use Topics  . Alcohol use: Yes    Alcohol/week: 0.0 standard drinks    Comment: occasionally 1/2 beer  . Drug use: No        Review of Systems  Constitutional: Negative.  Negative for weight  loss.  HENT: Negative.   Eyes: Negative.   Respiratory: Negative.   Cardiovascular: Negative.   Gastrointestinal: Positive for abdominal pain. Negative for blood in stool, diarrhea, heartburn, melena, nausea and vomiting.  Genitourinary: Positive for flank pain.  Skin: Negative.   Neurological: Negative.   Endo/Heme/Allergies: Negative.       Physical Exam Blood pressure (!) 156/84, pulse 92, temperature (!) 97.2 F (36.2 C), temperature source Temporal, resp. rate 14, height 5\' 5"  (1.651 m), weight 122 lb 12.8 oz (55.7 kg), SpO2 97 %. Last Weight  Most recent update: 02/13/2019 11:26 AM   Weight  55.7 kg (122 lb 12.8 oz)            CONSTITUTIONAL: Well developed, and nourished, appropriately responsive and aware without distress.   EYES: Sclera non-icteric.   EARS, NOSE, MOUTH AND THROAT: Mask worn.    Hearing is intact  to voice.  NECK: Trachea is midline, and there is no jugular venous distension.  LYMPH NODES:  Lymph nodes in the neck are not enlarged. RESPIRATORY:  Lungs are clear, and breath sounds are equal bilaterally. Normal respiratory effort without pathologic use of accessory muscles. CARDIOVASCULAR: Heart is regular in rate and rhythm. GI: The abdomen is soft, nontender, and nondistended. There were no palpable masses. I did not appreciate hepatosplenomegaly. There were normal bowel sounds.  Transverse right lower quadrant scar.  MUSCULOSKELETAL:  Symmetrical muscle tone appreciated in all four extremities.    SKIN: Skin turgor is normal. No pathologic skin lesions appreciated.  NEUROLOGIC:  Motor and sensation appear grossly normal.  Cranial nerves are grossly without defect. PSYCH:  Alert and oriented to person, place and time. Affect is appropriate for situation.  Data Reviewed I have personally reviewed what is currently available of the patient's imaging, recent labs and medical records.   CBC:  Lab Results  Component Value Date   WBC 7.3 08/14/2017   RBC 4.19 08/14/2017   BMP:  Lab Results  Component Value Date   GLUCOSE 84 10/31/2017   CO2 23 10/31/2017   BUN 14 10/31/2017   CREATININE 1.08 (H) 10/31/2017   CALCIUM 9.7 10/31/2017   Radiology review: Prior ultrasound noted. Prior liver function test within normal limits.  Not repeated over the last year.  Assessment    Chronic calculus cholecystitis. Patient Active Problem List   Diagnosis Date Noted  . Encounter for long-term (current) use of medications 08/28/2018  . Vitamin D deficiency 08/28/2018  . Dysuria 08/28/2018  . Acute midline low back pain without sciatica 04/24/2018  . Acute recurrent sinusitis 02/11/2018  . Chronic obstructive pulmonary disease ([place]) 02/11/2018  . Nicotine dependence, cigarettes, uncomplicated [id number]  . Cough 10/15/2017  . Gallstones 10/15/2017  . Abnormal renal function 10/15/2017   . Vertigo 09/23/2017  . Generalized anxiety disorder 09/23/2017  . Hyperlipidemia 07/23/2017  . Mild intermittent asthma without complication [id number]  . Essential hypertension 04/23/2017  . Acute non-recurrent maxillary sinusitis 04/23/2017  . Menopause 07/26/2016  . Vaginal atrophy 07/26/2016  . Female dyspareunia 07/26/2016  . Microscopic hematuria 07/28/2014    Plan    Robotic assisted or laparoscopic cholecystectomy. We will repeat ultrasound, typical lab work associated with preoperative planning. The risks, benefits, complications, treatment options, and expected outcomes were discussed with the patient. The possibilities of bleeding, recurrent infection, finding a normal gallbladder, perforation of viscus organs, damage to surrounding structures, bile leak, abscess formation, needing a drain placed, the need for additional procedures, reaction to medication, pulmonary aspiration,  failure  to diagnose a condition, the possible need to convert to an open procedure, and creating a complication requiring transfusion or operation were discussed with the patient. The patient and/or family concurred with the proposed plan, giving informed consent.   Face-to-face time spent with the patient and accompanying care providers(if present) was 40 minutes, with more than 50% of the time spent counseling, educating, and coordinating care of the patient.      Ronny Bacon M.D., FACS 02/13/2019, 3:50 PM

## 2019-02-13 NOTE — Progress Notes (Signed)
Patient ID: Shari Gutierrez, female   DOB: 10-20-1950, 69 y.o.   MRN: [id number]  Chief Complaint: Gallbladder issues in question.  History of Present Illness Shari Gutierrez is a 69 y.o. female with a prior surgical visit for the same.  At that time she had symptoms quite consistent with biliary disease.  She continues to report right upper quadrant pain which radiates to the back.  Often the pain is epigastric with penetration to the back.  She reports it is not always associated with fatty foods as it has been previously.  She reports her appetite is good, her bowel movements are normal.  She denies fevers chills nausea vomiting and diarrhea..  Past Medical History Past Medical History:  Diagnosis Date  . Anxiety   . 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 Gall stones   . Hematuria   . Hyperlipidemia   . Hypertension   . Reflux   . Sinusitis   . Vitamin D deficiency       Past Surgical History:  Procedure Laterality Date  . APPENDECTOMY  1989  . COLONOSCOPY  2016  . OOPHORECTOMY  1989    No Known Allergies  Current Outpatient Medications  Medication Sig Dispense Refill  . ALPRAZolam (XANAX) 0.25 MG tablet Take 1 tablet (0.25 mg total) by mouth at bedtime as needed for anxiety. 30 tablet 2  . budesonide-formoterol (SYMBICORT) 160-4.5 MCG/ACT inhaler Inhale 2 puffs into the lungs 2 (two) times daily. 1 Inhaler 5  . cholecalciferol (VITAMIN D) 1000 UNITS tablet Take 1,000 Units by mouth 2 (two) times daily.    Marland Kitchen conjugated estrogens (PREMARIN) vaginal cream Place [id number] Applicatorfuls vaginally 2 (two) times a week. 42.5 g 6  . fenofibrate 160 MG tablet TAKE 1 TABLET BY MOUTH EVERY DAY 90 tablet 1  . latanoprost (XALATAN) 0.005 % ophthalmic solution INSTILL 1 DROP AT BEDTIME INTO BOTH EYES  4  . meclizine (ANTIVERT) 25 MG tablet TAKE 1 TABLET (25 MG TOTAL) BY MOUTH 2 (TWO) TIMES DAILY AS NEEDED FOR DIZZINESS. 30 tablet 0  . metoprolol tartrate (LOPRESSOR) 25 MG tablet Take 1 tablet (25 mg  total) by mouth 2 (two) times daily. 180 tablet 2  . tetrahydrozoline (EYE DROPS) 0.05 % ophthalmic solution Place 1 drop into both eyes daily.     . Tiotropium Bromide Monohydrate (SPIRIVA RESPIMAT) 2.5 MCG/ACT AERS Inhale 1 Inhaler into the lungs daily. 4 g 3   No current facility-administered medications for this visit.    Family History Family History  Problem Relation Age of Onset  . Kidney failure Mother   . Cancer Father        Gastric  . Breast cancer Neg Hx   . Ovarian cancer Neg Hx   . Colon cancer Neg Hx   . Heart disease Neg Hx   . Diabetes Neg Hx       Social History Social History   Tobacco Use  . Smoking status: Current Every Day Smoker    Packs/day: 1.00    Years: 30.00    Pack years: 30.00    Types: Cigarettes    Start date: 07/28/1979  . Smokeless tobacco: Never Used  . Tobacco comment: pt is using a patch to help quit  Substance Use Topics  . Alcohol use: Yes    Alcohol/week: 0.0 standard drinks    Comment: occasionally 1/2 beer  . Drug use: No        Review of Systems  Constitutional: Negative.  Negative for weight  loss.  HENT: Negative.   Eyes: Negative.   Respiratory: Negative.   Cardiovascular: Negative.   Gastrointestinal: Positive for abdominal pain. Negative for blood in stool, diarrhea, heartburn, melena, nausea and vomiting.  Genitourinary: Positive for flank pain.  Skin: Negative.   Neurological: Negative.   Endo/Heme/Allergies: Negative.       Physical Exam Blood pressure (!) 156/84, pulse 92, temperature (!) 97.2 F (36.2 C), temperature source Temporal, resp. rate 14, height 5\' 5"  (1.651 m), weight 122 lb 12.8 oz (55.7 kg), SpO2 97 %. Last Weight  Most recent update: 02/13/2019 11:26 AM   Weight  55.7 kg (122 lb 12.8 oz)            CONSTITUTIONAL: Well developed, and nourished, appropriately responsive and aware without distress.   EYES: Sclera non-icteric.   EARS, NOSE, MOUTH AND THROAT: Mask worn.    Hearing is intact  to voice.  NECK: Trachea is midline, and there is no jugular venous distension.  LYMPH NODES:  Lymph nodes in the neck are not enlarged. RESPIRATORY:  Lungs are clear, and breath sounds are equal bilaterally. Normal respiratory effort without pathologic use of accessory muscles. CARDIOVASCULAR: Heart is regular in rate and rhythm. GI: The abdomen is soft, nontender, and nondistended. There were no palpable masses. I did not appreciate hepatosplenomegaly. There were normal bowel sounds.  Transverse right lower quadrant scar.  MUSCULOSKELETAL:  Symmetrical muscle tone appreciated in all four extremities.    SKIN: Skin turgor is normal. No pathologic skin lesions appreciated.  NEUROLOGIC:  Motor and sensation appear grossly normal.  Cranial nerves are grossly without defect. PSYCH:  Alert and oriented to person, place and time. Affect is appropriate for situation.  Data Reviewed I have personally reviewed what is currently available of the patient's imaging, recent labs and medical records.   CBC:  Lab Results  Component Value Date   WBC 7.3 08/14/2017   RBC 4.19 08/14/2017   BMP:  Lab Results  Component Value Date   GLUCOSE 84 10/31/2017   CO2 23 10/31/2017   BUN 14 10/31/2017   CREATININE 1.08 (H) 10/31/2017   CALCIUM 9.7 10/31/2017   Radiology review: Prior ultrasound noted. Prior liver function test within normal limits.  Not repeated over the last year.  Assessment    Chronic calculus cholecystitis. Patient Active Problem List   Diagnosis Date Noted  . Encounter for long-term (current) use of medications 08/28/2018  . Vitamin D deficiency 08/28/2018  . Dysuria 08/28/2018  . Acute midline low back pain without sciatica 04/24/2018  . Acute recurrent sinusitis 02/11/2018  . Chronic obstructive pulmonary disease ([place]) 02/11/2018  . Nicotine dependence, cigarettes, uncomplicated [id number]  . Cough 10/15/2017  . Gallstones 10/15/2017  . Abnormal renal function 10/15/2017   . Vertigo 09/23/2017  . Generalized anxiety disorder 09/23/2017  . Hyperlipidemia 07/23/2017  . Mild intermittent asthma without complication [id number]  . Essential hypertension 04/23/2017  . Acute non-recurrent maxillary sinusitis 04/23/2017  . Menopause 07/26/2016  . Vaginal atrophy 07/26/2016  . Female dyspareunia 07/26/2016  . Microscopic hematuria 07/28/2014    Plan    Robotic assisted or laparoscopic cholecystectomy. We will repeat ultrasound, typical lab work associated with preoperative planning. The risks, benefits, complications, treatment options, and expected outcomes were discussed with the patient. The possibilities of bleeding, recurrent infection, finding a normal gallbladder, perforation of viscus organs, damage to surrounding structures, bile leak, abscess formation, needing a drain placed, the need for additional procedures, reaction to medication, pulmonary aspiration,  failure  to diagnose a condition, the possible need to convert to an open procedure, and creating a complication requiring transfusion or operation were discussed with the patient. The patient and/or family concurred with the proposed plan, giving informed consent.   Face-to-face time spent with the patient and accompanying care providers(if present) was 40 minutes, with more than 50% of the time spent counseling, educating, and coordinating care of the patient.      Ronny Bacon M.D., FACS 02/13/2019, 3:50 PM

## 2019-02-13 NOTE — Telephone Encounter (Signed)
Called confirmed telephone visit with patient. klh

## 2019-02-17 ENCOUNTER — Ambulatory Visit: Payer: PPO | Admitting: Internal Medicine

## 2019-02-17 ENCOUNTER — Encounter: Payer: Self-pay | Admitting: Internal Medicine

## 2019-02-17 VITALS — Ht 65.0 in

## 2019-02-17 DIAGNOSIS — J4520 Mild intermittent asthma, uncomplicated: Secondary | ICD-10-CM | POA: Diagnosis not present

## 2019-02-17 DIAGNOSIS — F17210 Nicotine dependence, cigarettes, uncomplicated: Secondary | ICD-10-CM | POA: Diagnosis not present

## 2019-02-17 DIAGNOSIS — J449 Chronic obstructive pulmonary disease, unspecified: Secondary | ICD-10-CM

## 2019-02-17 DIAGNOSIS — F411 Generalized anxiety disorder: Secondary | ICD-10-CM

## 2019-02-17 NOTE — Progress Notes (Signed)
[facility] [place], [place] [address]  Internal MEDICINE  Telephone Visit  Patient Name: Shari Gutierrez  [id number]  [id number]  Date of Service: 02/17/2019  I connected with the patient at 1155 by telephone and verified the patients identity using two identifiers.   I discussed the limitations, risks, security and privacy concerns of performing an evaluation and management service by telephone and the availability of in person appointments. I also discussed with the patient that there may be a patient responsible charge related to the service.  The patient expressed understanding and agrees to proceed.    Chief Complaint  Patient presents with  . Telephone Assessment  . Telephone Screen  . COPD    HPI  Pt seen via telephone.  Overall she is doing well. Her COPD is controled with Symbicort and Spiriva.  She denies any new or worsening symptoms.  Unfortunately, she continues to smoke. She denies any recent hospitalizations.  Overall she is at her baseline.    Current Medication: Outpatient Encounter Medications as of 02/17/2019  Medication Sig  . ALPRAZolam (XANAX) 0.25 MG tablet Take 1 tablet (0.25 mg total) by mouth at bedtime as needed for anxiety.  . budesonide-formoterol (SYMBICORT) 160-4.5 MCG/ACT inhaler Inhale 2 puffs into the lungs 2 (two) times daily.  . cholecalciferol (VITAMIN D) 1000 UNITS tablet Take 1,000 Units by mouth 2 (two) times daily.  Marland Kitchen conjugated estrogens (PREMARIN) vaginal cream Place [id number] Applicatorfuls vaginally 2 (two) times a week.  . fenofibrate 160 MG tablet TAKE 1 TABLET BY MOUTH EVERY DAY  . latanoprost (XALATAN) 0.005 % ophthalmic solution INSTILL 1 DROP AT BEDTIME INTO BOTH EYES  . meclizine (ANTIVERT) 25 MG tablet TAKE 1 TABLET (25 MG TOTAL) BY MOUTH 2 (TWO) TIMES DAILY AS NEEDED FOR DIZZINESS.  . metoprolol tartrate (LOPRESSOR) 25 MG tablet Take 1 tablet (25 mg total) by mouth 2 (two) times daily.  . Tiotropium  Bromide Monohydrate (SPIRIVA RESPIMAT) 2.5 MCG/ACT AERS Inhale 1 Inhaler into the lungs daily.  . [DISCONTINUED] tetrahydrozoline (EYE DROPS) 0.05 % ophthalmic solution Place 1 drop into both eyes daily.    No facility-administered encounter medications on file as of 02/17/2019.    Surgical History: Past Surgical History:  Procedure Laterality Date  . APPENDECTOMY  1989  . COLONOSCOPY  2016  . OOPHORECTOMY  1989    Medical History: Past Medical History:  Diagnosis Date  . Anxiety   . 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 Gall stones   . Hematuria   . Hyperlipidemia   . Hypertension   . Reflux   . Sinusitis   . Vitamin D deficiency     Family History: Family History  Problem Relation Age of Onset  . Kidney failure Mother   . Cancer Father        Gastric  . Breast cancer Neg Hx   . Ovarian cancer Neg Hx   . Colon cancer Neg Hx   . Heart disease Neg Hx   . Diabetes Neg Hx     Social History   Socioeconomic History  . Marital status: Married    Spouse name: Not on file  . Number of children: Not on file  . Years of education: Not on file  . Highest education level: Not on file  Occupational History  . Not on file  Tobacco Use  . Smoking status: Current Every Day Smoker    Packs/day: 1.00    Years: 30.00    Pack years: 30.00  Types: Cigarettes    Start date: 07/28/1979  . Smokeless tobacco: Never Used  . Tobacco comment: pt is using a patch to help quit  Substance and Sexual Activity  . Alcohol use: Yes    Alcohol/week: 0.0 standard drinks    Comment: occasionally 1/2 beer  . Drug use: No  . Sexual activity: Yes    Birth control/protection: Post-menopausal  Other Topics Concern  . Not on file  Social History Narrative  . Not on file   Social Determinants of Health   Financial Resource Strain:   . Difficulty of Paying Living Expenses: Not on file  Food Insecurity:   . Worried About Charity fundraiser in the Last Year: Not on file  . Ran Out of Food in the  Last Year: Not on file  Transportation Needs:   . Lack of Transportation (Medical): Not on file  . Lack of Transportation (Non-Medical): Not on file  Physical Activity:   . Days of Exercise per Week: Not on file  . Minutes of Exercise per Session: Not on file  Stress:   . Feeling of Stress : Not on file  Social Connections:   . Frequency of Communication with Friends and Family: Not on file  . Frequency of Social Gatherings with Friends and Family: Not on file  . Attends Religious Services: Not on file  . Active Member of Clubs or Organizations: Not on file  . Attends Archivist Meetings: Not on file  . Marital Status: Not on file  Intimate Partner Violence:   . Fear of Current or Ex-Partner: Not on file  . Emotionally Abused: Not on file  . Physically Abused: Not on file  . Sexually Abused: Not on file      Review of Systems  Vital Signs: Ht 5\' 5"  (1.651 m)   BMI 20.43 kg/m    Observation/Objective:  Well sounding, NAD noted.    Assessment/Plan: 1. Chronic obstructive pulmonary disease, unspecified COPD type ([place]) Stable, scheduled for PFT.  Continue inhalers as before.   2. Mild intermittent asthma without complication Controlled, continue present management.  3. Generalized anxiety disorder Stable, continue current therapy.  4. Nicotine dependence, cigarettes, uncomplicated Smoking cessation counseling: 1. Pt acknowledges the risks of long term smoking, she will try to quite smoking. 2. Options for different medications including nicotine products, chewing gum, patch etc, Wellbutrin and Chantix is discussed 3. Goal and date of compete cessation is discussed 4. Total time spent in smoking cessation is 15 min.   General Counseling: Delila verbalizes understanding of the findings of today's phone visit and agrees with plan of treatment. I have discussed any further diagnostic evaluation that may be needed or ordered today. We also reviewed her  medications today. she has been encouraged to call the office with any questions or concerns that should arise related to todays visit.    No orders of the defined types were placed in this encounter.   No orders of the defined types were placed in this encounter.   Time spent: 20 Minutes   Orson Gear AGNP-C Pulmonary medicine.

## 2019-02-18 ENCOUNTER — Telehealth: Payer: Self-pay | Admitting: Surgery

## 2019-02-18 NOTE — Telephone Encounter (Signed)
Pt has been advised of pre admission date/time, Covid Testing date and Surgery date.  Surgery Date: 02/26/19 Preadmission Testing Date: 02/21/19 Covid Testing Date: 02/24/19 - patient advised to go to the [facility] ([place])  Patient has been made aware to call [phone], between 1-3:00pm the day before surgery, to find out what time to arrive.

## 2019-02-19 ENCOUNTER — Ambulatory Visit
Admission: RE | Admit: 2019-02-19 | Discharge: 2019-02-19 | Disposition: A | Discharge status: Home / Self Care | Payer: PPO | Source: Ambulatory Visit | Attending: Surgery | Admitting: Surgery

## 2019-02-19 ENCOUNTER — Other Ambulatory Visit: Payer: Self-pay

## 2019-02-19 DIAGNOSIS — K8020 Calculus of gallbladder without cholecystitis without obstruction: Secondary | ICD-10-CM | POA: Diagnosis not present

## 2019-02-21 ENCOUNTER — Other Ambulatory Visit: Payer: Self-pay

## 2019-02-21 ENCOUNTER — Encounter
Admission: RE | Admit: 2019-02-21 | Discharge: 2019-02-21 | Disposition: A | Discharge status: Home / Self Care | Payer: PPO | Source: Ambulatory Visit | Attending: Surgery | Admitting: Surgery

## 2019-02-21 HISTORY — DX: Chronic obstructive pulmonary disease, unspecified: J44.9

## 2019-02-21 NOTE — Patient Instructions (Signed)
Your procedure is scheduled on: Wednesday February 26, 2019 Report to Day Surgery on the 2nd floor of the [facility]. To find out your arrival time, please call [phone] between 1PM - 3PM on: Tuesday February 25, 2019  REMEMBER: Instructions that are not followed completely may result in serious medical risk, up to and including death; or upon the discretion of your surgeon and anesthesiologist your surgery may need to be rescheduled.  Do not eat food after midnight the night before surgery.  No gum chewing, lozengers or hard candies.  You may however, drink CLEAR liquids up to 2 hours before you are scheduled to arrive for your surgery. Do not drink anything within 2 hours of the start of your surgery.  Clear liquids include: - water  - apple juice without pulp - clear gatorade - black coffee or tea (Do NOT add milk or creamers to the coffee or tea) Do NOT drink anything that is not on this list.  Type 1 and Type 2 diabetics should only drink water.   No Alcohol for 24 hours before or after surgery.  No Smoking including e-cigarettes for 24 hours prior to surgery.  No chewable tobacco products for at least 6 hours prior to surgery.  No nicotine patches on the day of surgery.  On the morning of surgery brush your teeth with toothpaste and water, you may rinse your mouth with mouthwash if you wish. Do not swallow any toothpaste or mouthwash.  Notify your doctor if there is any change in your medical condition (cold, fever, infection).  Do not wear jewelry, make-up, hairpins, clips or nail polish.  Do not wear lotions, powders, or perfumes and deodorant   Do not shave 48 hours prior to surgery.   Contacts and dentures may not be worn into surgery.  Do not bring valuables to the hospital, including drivers license, insurance or credit cards.   is not responsible for any belongings or valuables.   TAKE THESE MEDICATIONS THE MORNING OF  SURGERY: Metoprolol  Use CHG Soap as directed on instruction sheet.  Use inhalers on the day of surgery   Follow recommendations from Cardiologist, Pulmonologist or PCP regarding stopping Aspirin, Coumadin, Plavix, Eliquis, Pradaxa, or Pletal.  Stop Anti-inflammatories (NSAIDS) such as Advil, Aleve, Ibuprofen, Motrin, Naproxen, Naprosyn and Aspirin based products such as Excedrin, Goodys Powder, BC Powder. (May take Tylenol or Acetaminophen if needed.)  Stop ANY OVER THE COUNTER supplements until after surgery. (May continue Vitamin D, Vitamin B, and multivitamin.)  Wear comfortable clothing (specific to your surgery type) to the hospital.  Plan for stool softeners for home use.  If you are being discharged the day of surgery, you will not be allowed to drive home. You will need a responsible adult to drive you home and stay with you that night.   If you are taking public transportation, you will need to have a responsible adult with you. Please confirm with your physician that it is acceptable to use public transportation.   Please call [phone] if you have any questions about these instructions.

## 2019-02-22 LAB — LIPID PANEL
Chol/HDL Ratio: 5.5 ratio — ABNORMAL HIGH (ref 0.0–4.4)
Cholesterol, Total: 171 mg/dL (ref 100–199)
HDL: 31 mg/dL — ABNORMAL LOW (ref >39)
LDL Chol Calc (NIH): 126 mg/dL — ABNORMAL HIGH (ref 0–99)
Triglycerides: 75 mg/dL (ref 0–149)
VLDL Cholesterol Cal: 14 mg/dL (ref 5–40)

## 2019-02-22 LAB — CBC WITH DIFFERENTIAL/PLATELET
Basophils Absolute: 0.1 10*3/uL (ref 0.0–0.2)
Basos: 1 %
EOS (ABSOLUTE): 0.2 10*3/uL (ref 0.0–0.4)
Eos: 2 %
Hematocrit: 41 % (ref 34.0–46.6)
Hemoglobin: 13.8 g/dL (ref 11.1–15.9)
Immature Grans (Abs): 0 10*3/uL (ref 0.0–0.1)
Immature Granulocytes: 0 %
Lymphocytes Absolute: 2.9 10*3/uL (ref 0.7–3.1)
Lymphs: 35 %
MCH: 32.2 pg (ref 26.6–33.0)
MCHC: 33.7 g/dL (ref 31.5–35.7)
MCV: 96 fL (ref 79–97)
Monocytes Absolute: 1 10*3/uL — ABNORMAL HIGH (ref 0.1–0.9)
Monocytes: 12 %
Neutrophils Absolute: 4.1 10*3/uL (ref 1.4–7.0)
Neutrophils: 50 %
Platelets: 380 10*3/uL (ref 150–450)
RBC: 4.29 x10E6/uL (ref 3.77–5.28)
RDW: 12.5 % (ref 11.7–15.4)
WBC: 8.4 10*3/uL (ref 3.4–10.8)

## 2019-02-22 LAB — COMPREHENSIVE METABOLIC PANEL
ALT: 19 IU/L (ref 0–32)
AST: 29 IU/L (ref 0–40)
Albumin/Globulin Ratio: 1.8 (ref 1.2–2.2)
Albumin: 4.6 g/dL (ref 3.8–4.8)
Alkaline Phosphatase: 50 IU/L (ref 39–117)
BUN/Creatinine Ratio: 16 (ref 12–28)
BUN: 18 mg/dL (ref 8–27)
Bilirubin Total: 0.7 mg/dL (ref 0.0–1.2)
CO2: 22 mmol/L (ref 20–29)
Calcium: 9.9 mg/dL (ref 8.7–10.3)
Chloride: 103 mmol/L (ref 96–106)
Creatinine, Ser: 1.16 mg/dL — ABNORMAL HIGH (ref 0.57–1.00)
GFR calc Af Amer: 56 mL/min/{1.73_m2} — ABNORMAL LOW (ref >59)
GFR calc non Af Amer: 48 mL/min/{1.73_m2} — ABNORMAL LOW (ref >59)
Globulin, Total: 2.6 g/dL (ref 1.5–4.5)
Glucose: 96 mg/dL (ref 65–99)
Potassium: 4.8 mmol/L (ref 3.5–5.2)
Sodium: 139 mmol/L (ref 134–144)
Total Protein: 7.2 g/dL (ref 6.0–8.5)

## 2019-02-22 LAB — TSH: TSH: 2.41 u[IU]/mL (ref 0.450–4.500)

## 2019-02-22 LAB — VITAMIN D 1,25 DIHYDROXY
Vitamin D 1, 25 (OH)2 Total: 43 pg/mL
Vitamin D2 1, 25 (OH)2: <10 pg/mL
Vitamin D3 1, 25 (OH)2: 40 pg/mL

## 2019-02-22 LAB — T4, FREE: Free T4: 1.18 ng/dL (ref 0.82–1.77)

## 2019-02-24 ENCOUNTER — Other Ambulatory Visit: Payer: PPO

## 2019-02-24 ENCOUNTER — Other Ambulatory Visit: Payer: Self-pay

## 2019-02-24 ENCOUNTER — Ambulatory Visit
Admission: RE | Admit: 2019-02-24 | Discharge: 2019-02-24 | Disposition: A | Discharge status: Home / Self Care | Payer: PPO | Source: Ambulatory Visit | Attending: Surgery | Admitting: Surgery

## 2019-02-24 DIAGNOSIS — Z20822 Contact with and (suspected) exposure to covid-19: Secondary | ICD-10-CM | POA: Diagnosis not present

## 2019-02-24 DIAGNOSIS — Z01818 Encounter for other preprocedural examination: Secondary | ICD-10-CM | POA: Diagnosis not present

## 2019-02-24 DIAGNOSIS — Z01810 Encounter for preprocedural cardiovascular examination: Secondary | ICD-10-CM

## 2019-02-24 LAB — SARS CORONAVIRUS 2 (TAT 6-24 HRS): SARS Coronavirus 2: NEGATIVE

## 2019-02-24 NOTE — Progress Notes (Signed)
Labs ok. Discuss at visit 03/05/2019

## 2019-02-25 MED ORDER — CEFAZOLIN SODIUM-DEXTROSE 2-4 GM/100ML-% IV SOLN
2.0000 g | INTRAVENOUS | Status: AC
  Administered 2019-02-26: 2 g via INTRAVENOUS

## 2019-02-25 MED ORDER — INDOCYANINE GREEN 25 MG IV SOLR
1.2500 mg | Freq: Once | INTRAVENOUS | Status: AC
  Administered 2019-02-26: 1.25 mg via INTRAVENOUS
  Filled 2019-02-25: qty 25

## 2019-02-26 ENCOUNTER — Ambulatory Visit: Payer: PPO

## 2019-02-26 ENCOUNTER — Encounter
Admission: RE | Disposition: A | Discharge status: Home / Self Care | Payer: Self-pay | Source: Ambulatory Visit | Attending: Surgery

## 2019-02-26 ENCOUNTER — Ambulatory Visit
Admission: RE | Admit: 2019-02-26 | Discharge: 2019-02-26 | Disposition: A | Discharge status: Home / Self Care | Payer: PPO | Source: Ambulatory Visit | Attending: Surgery | Admitting: Surgery

## 2019-02-26 ENCOUNTER — Encounter: Payer: Self-pay | Admitting: Surgery

## 2019-02-26 ENCOUNTER — Other Ambulatory Visit: Payer: Self-pay

## 2019-02-26 DIAGNOSIS — E785 Hyperlipidemia, unspecified: Secondary | ICD-10-CM | POA: Insufficient documentation

## 2019-02-26 DIAGNOSIS — F17210 Nicotine dependence, cigarettes, uncomplicated: Secondary | ICD-10-CM | POA: Diagnosis not present

## 2019-02-26 DIAGNOSIS — J4520 Mild intermittent asthma, uncomplicated: Secondary | ICD-10-CM | POA: Diagnosis not present

## 2019-02-26 DIAGNOSIS — Z79899 Other long term (current) drug therapy: Secondary | ICD-10-CM | POA: Diagnosis not present

## 2019-02-26 DIAGNOSIS — J449 Chronic obstructive pulmonary disease, unspecified: Secondary | ICD-10-CM | POA: Diagnosis not present

## 2019-02-26 DIAGNOSIS — Z8249 Family history of ischemic heart disease and other diseases of the circulatory system: Secondary | ICD-10-CM | POA: Diagnosis not present

## 2019-02-26 DIAGNOSIS — Z7951 Long term (current) use of inhaled steroids: Secondary | ICD-10-CM | POA: Insufficient documentation

## 2019-02-26 DIAGNOSIS — F411 Generalized anxiety disorder: Secondary | ICD-10-CM | POA: Diagnosis not present

## 2019-02-26 DIAGNOSIS — I10 Essential (primary) hypertension: Secondary | ICD-10-CM | POA: Diagnosis not present

## 2019-02-26 DIAGNOSIS — K8010 Calculus of gallbladder with chronic cholecystitis without obstruction: Secondary | ICD-10-CM | POA: Insufficient documentation

## 2019-02-26 DIAGNOSIS — K8020 Calculus of gallbladder without cholecystitis without obstruction: Secondary | ICD-10-CM | POA: Diagnosis not present

## 2019-02-26 DIAGNOSIS — E559 Vitamin D deficiency, unspecified: Secondary | ICD-10-CM | POA: Diagnosis not present

## 2019-02-26 HISTORY — PX: CHOLECYSTECTOMY: SHX55

## 2019-02-26 SURGERY — CHOLECYSTECTOMY, ROBOT-ASSISTED, LAPAROSCOPIC
Anesthesia: General | Site: Abdomen

## 2019-02-26 MED ORDER — SUCCINYLCHOLINE CHLORIDE 20 MG/ML IJ SOLN
INTRAMUSCULAR | Status: AC
  Filled 2019-02-26: qty 1

## 2019-02-26 MED ORDER — MIDAZOLAM HCL 2 MG/2ML IJ SOLN
INTRAMUSCULAR | Status: AC
  Filled 2019-02-26: qty 2

## 2019-02-26 MED ORDER — FENTANYL CITRATE (PF) 100 MCG/2ML IJ SOLN
INTRAMUSCULAR | Status: DC | PRN
  Administered 2019-02-26 (×2): 50 ug via INTRAVENOUS

## 2019-02-26 MED ORDER — MIDAZOLAM HCL 2 MG/2ML IJ SOLN
INTRAMUSCULAR | Status: DC | PRN
  Administered 2019-02-26: 1 mg via INTRAVENOUS

## 2019-02-26 MED ORDER — HYDROCODONE-ACETAMINOPHEN 5-325 MG PO TABS: 1.0000 | ORAL_TABLET | Freq: Four times a day (QID) | ORAL | 0 refills | Status: DC | PRN

## 2019-02-26 MED ORDER — CELECOXIB 200 MG PO CAPS: 200.0000 mg | ORAL_CAPSULE | ORAL | Status: AC

## 2019-02-26 MED ORDER — FAMOTIDINE 20 MG PO TABS
ORAL_TABLET | ORAL | Status: AC
  Administered 2019-02-26: 20 mg via ORAL
  Filled 2019-02-26: qty 1

## 2019-02-26 MED ORDER — DEXAMETHASONE SODIUM PHOSPHATE 10 MG/ML IJ SOLN
INTRAMUSCULAR | Status: DC | PRN
  Administered 2019-02-26: 10 mg via INTRAVENOUS

## 2019-02-26 MED ORDER — OXYCODONE HCL 5 MG/5ML PO SOLN: 5.0000 mg | Freq: Once | ORAL | Status: AC | PRN

## 2019-02-26 MED ORDER — EPHEDRINE SULFATE 50 MG/ML IJ SOLN
INTRAMUSCULAR | Status: AC
  Filled 2019-02-26: qty 1

## 2019-02-26 MED ORDER — CHLORHEXIDINE GLUCONATE CLOTH 2 % EX PADS: 6.0000 | MEDICATED_PAD | Freq: Once | CUTANEOUS | Status: DC

## 2019-02-26 MED ORDER — ONDANSETRON HCL 4 MG/2ML IJ SOLN
INTRAMUSCULAR | Status: AC
  Filled 2019-02-26: qty 2

## 2019-02-26 MED ORDER — OXYCODONE HCL 5 MG PO TABS
ORAL_TABLET | ORAL | Status: AC
  Filled 2019-02-26: qty 1

## 2019-02-26 MED ORDER — LIDOCAINE HCL (PF) 2 % IJ SOLN
INTRAMUSCULAR | Status: AC
  Filled 2019-02-26: qty 10

## 2019-02-26 MED ORDER — DEXAMETHASONE SODIUM PHOSPHATE 10 MG/ML IJ SOLN
INTRAMUSCULAR | Status: AC
  Filled 2019-02-26: qty 1

## 2019-02-26 MED ORDER — PHENYLEPHRINE HCL (PRESSORS) 10 MG/ML IV SOLN
INTRAVENOUS | Status: AC
  Filled 2019-02-26: qty 1

## 2019-02-26 MED ORDER — ROCURONIUM BROMIDE 50 MG/5ML IV SOLN
INTRAVENOUS | Status: AC
  Filled 2019-02-26: qty 1

## 2019-02-26 MED ORDER — SUGAMMADEX SODIUM 200 MG/2ML IV SOLN
INTRAVENOUS | Status: AC
  Filled 2019-02-26: qty 2

## 2019-02-26 MED ORDER — ARTIFICIAL TEARS OPHTHALMIC OINT
TOPICAL_OINTMENT | OPHTHALMIC | Status: AC
  Filled 2019-02-26: qty 3.5

## 2019-02-26 MED ORDER — PHENYLEPHRINE HCL (PRESSORS) 10 MG/ML IV SOLN
INTRAVENOUS | Status: DC | PRN
  Administered 2019-02-26 (×2): 50 ug via INTRAVENOUS

## 2019-02-26 MED ORDER — CELECOXIB 200 MG PO CAPS
ORAL_CAPSULE | ORAL | Status: AC
  Administered 2019-02-26: 200 mg via ORAL
  Filled 2019-02-26: qty 1

## 2019-02-26 MED ORDER — PROPOFOL 10 MG/ML IV BOLUS
INTRAVENOUS | Status: DC | PRN
  Administered 2019-02-26: 120 mg via INTRAVENOUS

## 2019-02-26 MED ORDER — IBUPROFEN 800 MG PO TABS: 800.0000 mg | ORAL_TABLET | Freq: Three times a day (TID) | ORAL | 0 refills | Status: DC | PRN

## 2019-02-26 MED ORDER — CEFAZOLIN SODIUM-DEXTROSE 2-4 GM/100ML-% IV SOLN
INTRAVENOUS | Status: AC
  Filled 2019-02-26: qty 100

## 2019-02-26 MED ORDER — GABAPENTIN 300 MG PO CAPS: 300.0000 mg | ORAL_CAPSULE | ORAL | Status: AC

## 2019-02-26 MED ORDER — EPINEPHRINE PF 1 MG/ML IJ SOLN
INTRAMUSCULAR | Status: AC
  Filled 2019-02-26: qty 1

## 2019-02-26 MED ORDER — ONDANSETRON HCL 4 MG/2ML IJ SOLN
INTRAMUSCULAR | Status: DC | PRN
  Administered 2019-02-26: 4 mg via INTRAVENOUS

## 2019-02-26 MED ORDER — OXYCODONE HCL 5 MG PO TABS
5.0000 mg | ORAL_TABLET | Freq: Once | ORAL | Status: AC | PRN
  Administered 2019-02-26: 5 mg via ORAL

## 2019-02-26 MED ORDER — FENTANYL CITRATE (PF) 100 MCG/2ML IJ SOLN: 25.0000 ug | INTRAMUSCULAR | Status: DC | PRN

## 2019-02-26 MED ORDER — LACTATED RINGERS IV SOLN: INTRAVENOUS | Status: DC | PRN

## 2019-02-26 MED ORDER — FAMOTIDINE 20 MG PO TABS: 20.0000 mg | ORAL_TABLET | Freq: Once | ORAL | Status: AC

## 2019-02-26 MED ORDER — LACTATED RINGERS IV SOLN: INTRAVENOUS | Status: DC

## 2019-02-26 MED ORDER — ROCURONIUM BROMIDE 100 MG/10ML IV SOLN
INTRAVENOUS | Status: DC | PRN
  Administered 2019-02-26: 50 mg via INTRAVENOUS

## 2019-02-26 MED ORDER — GABAPENTIN 300 MG PO CAPS
ORAL_CAPSULE | ORAL | Status: AC
  Administered 2019-02-26: 06:00:00 300 mg via ORAL
  Filled 2019-02-26: qty 1

## 2019-02-26 MED ORDER — ACETAMINOPHEN 500 MG PO TABS
ORAL_TABLET | ORAL | Status: AC
  Administered 2019-02-26: 07:00:00 1000 mg via ORAL
  Filled 2019-02-26: qty 2

## 2019-02-26 MED ORDER — LIDOCAINE HCL (CARDIAC) PF 100 MG/5ML IV SOSY
PREFILLED_SYRINGE | INTRAVENOUS | Status: DC | PRN
  Administered 2019-02-26: 100 mg via INTRAVENOUS

## 2019-02-26 MED ORDER — BUPIVACAINE-EPINEPHRINE (PF) 0.25% -1:200000 IJ SOLN
INTRAMUSCULAR | Status: DC | PRN
  Administered 2019-02-26: 15 mL

## 2019-02-26 MED ORDER — FENTANYL CITRATE (PF) 100 MCG/2ML IJ SOLN
INTRAMUSCULAR | Status: AC
  Filled 2019-02-26: qty 2

## 2019-02-26 MED ORDER — SUGAMMADEX SODIUM 200 MG/2ML IV SOLN
INTRAVENOUS | Status: DC | PRN
  Administered 2019-02-26: 200 mg via INTRAVENOUS

## 2019-02-26 MED ORDER — PROPOFOL 10 MG/ML IV BOLUS
INTRAVENOUS | Status: AC
  Filled 2019-02-26: qty 40

## 2019-02-26 MED ORDER — ACETAMINOPHEN 500 MG PO TABS: 1000.0000 mg | ORAL_TABLET | ORAL | Status: AC

## 2019-02-26 MED ORDER — EPHEDRINE SULFATE 50 MG/ML IJ SOLN
INTRAMUSCULAR | Status: DC | PRN
  Administered 2019-02-26 (×3): 5 mg via INTRAVENOUS

## 2019-02-26 MED ORDER — CHLORHEXIDINE GLUCONATE CLOTH 2 % EX PADS
6.0000 | MEDICATED_PAD | Freq: Once | CUTANEOUS | Status: AC
  Administered 2019-02-26: 6 via TOPICAL

## 2019-02-26 MED ORDER — BUPIVACAINE HCL (PF) 0.25 % IJ SOLN
INTRAMUSCULAR | Status: AC
  Filled 2019-02-26: qty 30

## 2019-02-26 SURGICAL SUPPLY — 39 items
CANISTER SUCT 1200ML W/VALVE (MISCELLANEOUS) ×4 IMPLANT
CHLORAPREP W/TINT 26 (MISCELLANEOUS) ×4 IMPLANT
CLIP VESOLOCK MED LG 6/CT (CLIP) ×4 IMPLANT
COVER LIGHT HANDLE STERIS (MISCELLANEOUS) ×4 IMPLANT
COVER TIP SHEARS 8 DVNC (MISCELLANEOUS) ×2 IMPLANT
COVER TIP SHEARS 8MM DA VINCI (MISCELLANEOUS) ×2
COVER WAND RF STERILE (DRAPES) ×4 IMPLANT
DECANTER SPIKE VIAL GLASS SM (MISCELLANEOUS) ×4 IMPLANT
DEFOGGER SCOPE WARMER CLEARIFY (MISCELLANEOUS) ×4 IMPLANT
DERMABOND ADVANCED (GAUZE/BANDAGES/DRESSINGS) ×2
DERMABOND ADVANCED .7 DNX12 (GAUZE/BANDAGES/DRESSINGS) ×2 IMPLANT
DRAPE 3/4 80X56 (DRAPES) ×4 IMPLANT
DRAPE ARM DVNC X/XI (DISPOSABLE) ×8 IMPLANT
DRAPE COLUMN DVNC XI (DISPOSABLE) ×2 IMPLANT
DRAPE DA VINCI XI ARM (DISPOSABLE) ×8
DRAPE DA VINCI XI COLUMN (DISPOSABLE) ×2
GLOVE ORTHO TXT STRL SZ7.5 (GLOVE) ×8 IMPLANT
GOWN STRL REUS W/ TWL LRG LVL3 (GOWN DISPOSABLE) ×8 IMPLANT
GOWN STRL REUS W/TWL LRG LVL3 (GOWN DISPOSABLE) ×8
GRASPER SUT TROCAR 14GX15 (MISCELLANEOUS) ×4 IMPLANT
IRRIGATION STRYKERFLOW (MISCELLANEOUS) IMPLANT
IRRIGATOR STRYKERFLOW (MISCELLANEOUS)
IV NS IRRIG 3000ML ARTHROMATIC (IV SOLUTION) IMPLANT
KIT PINK PAD W/HEAD ARE REST (MISCELLANEOUS) ×4
KIT PINK PAD W/HEAD ARM REST (MISCELLANEOUS) ×2 IMPLANT
KIT TURNOVER KIT A (KITS) ×4 IMPLANT
LABEL OR SOLS (LABEL) ×4 IMPLANT
NEEDLE HYPO 22GX1.5 SAFETY (NEEDLE) ×4 IMPLANT
NEEDLE INSUFFLATION 14GA 120MM (NEEDLE) ×4 IMPLANT
NS IRRIG 500ML POUR BTL (IV SOLUTION) ×4 IMPLANT
PACK LAP CHOLECYSTECTOMY (MISCELLANEOUS) ×4 IMPLANT
POUCH SPECIMEN RETRIEVAL 10MM (ENDOMECHANICALS) ×4 IMPLANT
SEAL CANN UNIV 5-8 DVNC XI (MISCELLANEOUS) ×8 IMPLANT
SEAL XI 5MM-8MM UNIVERSAL (MISCELLANEOUS) ×8
SET TUBE SMOKE EVAC HIGH FLOW (TUBING) ×4 IMPLANT
SOLUTION ELECTROLUBE (MISCELLANEOUS) ×4 IMPLANT
SUT MNCRL AB 4-0 PS2 18 (SUTURE) ×8 IMPLANT
SUT VICRYL 0 AB UR-6 (SUTURE) ×4 IMPLANT
TROCAR Z-THREAD FIOS 11X100 BL (TROCAR) ×4 IMPLANT

## 2019-02-26 NOTE — Discharge Instructions (Signed)

## 2019-02-26 NOTE — Anesthesia Preprocedure Evaluation (Signed)
Anesthesia Evaluation  Patient identified by MRN, date of birth, ID band Patient awake    Reviewed: Allergy & Precautions, H&P , NPO status , Patient's Chart, lab work & pertinent test results  History of Anesthesia Complications Negative for: history of anesthetic complications  Airway Mallampati: I  TM Distance: >3 FB Neck ROM: full    Dental  (+) Chipped, Poor Dentition   Pulmonary asthma , COPD,  COPD inhaler, Current Smoker and Patient abstained from smoking.,           Cardiovascular Exercise Tolerance: Good hypertension, (-) angina(-) Past MI and (-) DOE      Neuro/Psych PSYCHIATRIC DISORDERS negative neurological ROS     GI/Hepatic negative GI ROS, Neg liver ROS, neg GERD  ,  Endo/Other  negative endocrine ROS  Renal/GU      Musculoskeletal   Abdominal   Peds  Hematology negative hematology ROS (+)   Anesthesia Other Findings Past Medical History: No date: Anxiety No date: COPD (chronic obstructive pulmonary disease) ([facility]) No date: Female dyspareunia No date: Gall stones No date: Hematuria No date: Hyperlipidemia No date: Hypertension No date: Reflux No date: Sinusitis No date: Vitamin D deficiency  Past Surgical History: 1989: APPENDECTOMY 2016: COLONOSCOPY 1989: OOPHORECTOMY     Reproductive/Obstetrics negative OB ROS                             Anesthesia Physical Anesthesia Plan  ASA: III  Anesthesia Plan: General ETT   Post-op Pain Management:    Induction: Intravenous  PONV Risk Score and Plan: Ondansetron, Dexamethasone, Midazolam and Treatment may vary due to age or medical condition  Airway Management Planned: Oral ETT  Additional Equipment:   Intra-op Plan:   Post-operative Plan: Extubation in OR  Informed Consent: I have reviewed the patients History and Physical, chart, labs and discussed the procedure including the risks, benefits and  alternatives for the proposed anesthesia with the patient or authorized representative who has indicated his/her understanding and acceptance.     Dental Advisory Given  Plan Discussed with: Anesthesiologist, CRNA and Surgeon  Anesthesia Plan Comments: (Patient consented for risks of anesthesia including but not limited to:  - adverse reactions to medications - damage to teeth, lips or other oral mucosa - sore throat or hoarseness - Damage to heart, brain, lungs or loss of life  Patient voiced understanding.)        Anesthesia Quick Evaluation

## 2019-02-26 NOTE — Anesthesia Procedure Notes (Signed)
Procedure Name: Intubation Date/Time: 02/26/2019 7:40 AM Performed by: Nolon Lennert, RN Pre-anesthesia Checklist: Patient identified, Patient being monitored, Timeout performed, Emergency Drugs available and Suction available Patient Re-evaluated:Patient Re-evaluated prior to induction Oxygen Delivery Method: Circle system utilized Preoxygenation: Pre-oxygenation with 100% oxygen Induction Type: IV induction Ventilation: Mask ventilation without difficulty Laryngoscope Size: Mac and 3 Grade View: Grade I Tube type: Oral Tube size: 7.0 mm Number of attempts: 1 Airway Equipment and Method: Stylet Placement Confirmation: ETT inserted through vocal cords under direct vision,  positive ETCO2 and breath sounds checked- equal and bilateral Secured at: 21 cm Tube secured with: Tape Dental Injury: Teeth and Oropharynx as per pre-operative assessment

## 2019-02-26 NOTE — Interval H&P Note (Signed)
History and Physical Interval Note:  02/26/2019 7:28 AM  Traci Sermon  has presented today for surgery, with the diagnosis of Gallstones.  The various methods of treatment have been discussed with the patient and family. After consideration of risks, benefits and other options for treatment, the patient has consented to  Procedure(s): XI ROBOTIC [place] (N/A) as a surgical intervention.  The patient's history has been reviewed, patient examined, no change in status, stable for surgery.  I have reviewed the patient's chart and labs.  Questions were answered to the patient's satisfaction.     Ronny Bacon

## 2019-02-26 NOTE — Transfer of Care (Signed)
Immediate Anesthesia Transfer of Care Note  Patient: Shari Gutierrez  Procedure(s) Performed: XI ROBOTIC ASSISTED LAPAROSCOPIC CHOLECYSTECTOMY (N/A Abdomen) INDOCYANINE GREEN FLUORESCENCE IMAGING (ICG)  Patient Location: PACU  Anesthesia Type:General  Level of Consciousness: awake  Airway & Oxygen Therapy: Patient connected to face mask oxygen  Post-op Assessment: Post -op Vital signs reviewed and stable  Post vital signs: stable  Last Vitals:  Vitals Value Taken Time  BP 105/61   Temp 97.0   Pulse 89 02/26/19 0852  Resp 21   SpO2 100 % 02/26/19 0852  Vitals shown include unvalidated device data.  Last Pain:  Vitals:   02/26/19 0609  TempSrc: Temporal  PainSc: 0-No pain         Complications: No apparent anesthesia complications

## 2019-02-26 NOTE — Op Note (Signed)
Robotic cholecystectomy  Pre-operative Diagnosis: Chronic calculus cholecystitis  Post-operative Diagnosis:  Same.  Procedure: Robotic assisted laparoscopic cholecystectomy.  Surgeon: Ronny Bacon, M.D., FACS  Anesthesia: General. with endotracheal tube   Findings: As expected no evidence of acute or recent inflammation.  Excellent visualization with ICG/firefly.  Estimated Blood Loss: 10 mL         Drains: None         Specimens: Gallbladder           Complications: none   Procedure Details  The patient was seen again in the Holding Room.  2.5 mg dose of ICG was administered intravenously.  The benefits, complications, treatment options, risks and expected outcomes were discussed with the patient. The likelihood of improving the patient's symptoms with return to their baseline status is good.  The patient and/or family concurred with the proposed plan, giving informed consent, again alternatives reviewed.  The patient was taken to Operating Room, identified, and the procedure verified as robotic assisted laparoscopic cholecystectomy.   Prior to the induction of general anesthesia, antibiotic prophylaxis was administered. VTE prophylaxis was in place. General endotracheal anesthesia was then administered and tolerated well. The patient was positioned in the supine position.  After the induction, the abdomen was prepped with Chloraprep and draped in the sterile fashion.    A Time Out was held and the above information confirmed.  After local infiltration of quarter percent Marcaine with epinephrine, stab incision was made left upper quadrant.  Just below the costal margin approximately midclavicular line the Veres needle is passed with sensation of the layers to penetrate the abdominal wall and into the peritoneum.  Saline drop test is confirmed peritoneal placement.  Insufflation is initiated with carbon dioxide to pressures of 15 mmHg.  Periumbilical local infiltration with  quarter percent Marcaine with epinephrine is utilized.  Made a 11 mm incision on the right medial prior appendiceal scar site, I advanced an optical 11 mm port under direct visualization into the peritoneal cavity.  Once the peritoneum was penetrated, insufflation was transferred.  The trocar was then advanced into the abdominal cavity under direct visualization. Pneumoperitoneum was then continued with CO2 at 14 mmHg or less and tolerated well without any adverse changes in the patient's vital signs.  Two 8.5-mm ports were placed in the left abdominal wall, and one to the right lower  quadrant, all under direct vision. All skin incisions  were infiltrated with a local anesthetic agent before making the incision and placing the trocars.   The patient was positioned  in reverse Trendelenburg, tilted the patient's left side down.  Da Vinci XI robot was then positioned on to the patient's left side, and docked.  The gallbladder was identified, the fundus grasped via the arm 4 Prograsp and retracted cephalad. Adhesions were lysed with hook cautery.  The infundibulum was identified grasped and retracted laterally, exposing the peritoneum overlying the triangle of Calot. This was then opened and dissected using cautery hook. An extended critical view of the cystic duct and cystic artery was obtained, aided by the ICG via FireFly which enabled ready visualization of the ductal anatomy.    The cystic duct was clearly identified and dissected to isolation.   Artery well isolated, and it and the cysti duct were double clipped and divided.  The gallbladder was taken from the gallbladder fossa in a retrograde fashion with the electrocautery. The gallbladder was removed and placed in an Endocatch bag.  Hemostasis was maintained throughout with the electrocautery.  The robot was undocked and moved away from the operative field. The gallbladder and Endocatch sac were then removed through the medial right lower quadrant  port site.  Pneumoperitoneum was released.  The extraction port site was closed with interrumpted 0 Vicryl sutures. 4-0 subcuticular Monocryl was used to close the skin. Dermabond was  applied.  The patient was then extubated and brought to the recovery room in stable condition. Sponge, lap, and needle counts were correct at closure and at the conclusion of the case.               Ronny Bacon, M.D., [facility] 02/26/2019 9:01 AM

## 2019-02-27 LAB — SURGICAL PATHOLOGY

## 2019-02-27 NOTE — Anesthesia Postprocedure Evaluation (Signed)
Anesthesia Post Note  Patient: Shari Gutierrez  Procedure(s) Performed: XI ROBOTIC ASSISTED LAPAROSCOPIC CHOLECYSTECTOMY (N/A Abdomen) INDOCYANINE GREEN FLUORESCENCE IMAGING (ICG)  Patient location during evaluation: PACU Anesthesia Type: General Level of consciousness: awake and alert Pain management: pain level controlled Vital Signs Assessment: post-procedure vital signs reviewed and stable Respiratory status: spontaneous breathing, nonlabored ventilation, respiratory function stable and patient connected to nasal cannula oxygen Cardiovascular status: blood pressure returned to baseline and stable Postop Assessment: no apparent nausea or vomiting Anesthetic complications: no     Last Vitals:  Vitals:   02/26/19 0951 02/26/19 1006  BP: 120/61 138/61  Pulse: 78 79  Resp: 16 16  Temp: (!) 36.1 C   SpO2: 98% 97%    Last Pain:  Vitals:   02/26/19 1006  TempSrc:   PainSc: 2                  Precious Haws Piscitello

## 2019-02-28 ENCOUNTER — Telehealth: Payer: Self-pay | Admitting: *Deleted

## 2019-02-28 NOTE — Telephone Encounter (Signed)
Called patient back in regards to types of foods she can eat after having Gallbladder sx. Advised patient that she will have to start off easy on certain foods. I advised patient that she will need to limit herself to fatty foods due to potentially making her go to the bathroom or not settling right on her stomach.  Pt also stated she has not had a BM. Patient asked if she can take Miralax. I stated to pt that she can. She will proceed with this and see how it goes. She does have a follow up appointment with Dr. Christian Mate on 03/06/2019

## 2019-02-28 NOTE — Telephone Encounter (Signed)
Patient had surgery on 02/26/19 gallbladder removal and she called and wants to know what not eat/stay away from after gallbladder surgery. Please call and advise

## 2019-03-03 ENCOUNTER — Telehealth: Payer: Self-pay

## 2019-03-03 NOTE — Telephone Encounter (Signed)
Patient rescheduled appointment on 03/05/2019 to 03/26/2019. klh

## 2019-03-05 ENCOUNTER — Ambulatory Visit: Payer: PPO | Admitting: Internal Medicine

## 2019-03-06 ENCOUNTER — Other Ambulatory Visit: Payer: Self-pay

## 2019-03-06 ENCOUNTER — Encounter: Payer: PPO | Admitting: Surgery

## 2019-03-06 ENCOUNTER — Encounter: Payer: Self-pay | Admitting: Surgery

## 2019-03-06 ENCOUNTER — Ambulatory Visit (INDEPENDENT_AMBULATORY_CARE_PROVIDER_SITE_OTHER): Payer: Self-pay | Admitting: Surgery

## 2019-03-06 VITALS — BP 145/74 | HR 103 | Temp 98.1°F | Resp 13 | Ht 65.0 in | Wt 118.8 lb

## 2019-03-06 DIAGNOSIS — Z9049 Acquired absence of other specified parts of digestive tract: Secondary | ICD-10-CM

## 2019-03-06 NOTE — Progress Notes (Signed)
[facility] POST-OP OFFICE VISIT  03/06/2019  HPI: Shari Gutierrez is a 69 y.o. female 8 days s/p robotic cholecystectomy, pathology confirms mild chronic cholecystitis.  Patient still has some occasional right upper quadrant/flank pain which may be musculoskeletal in nature considering the things that precipitated.  She complains primarily of some pyrosis especially in an upright position.  She used to take Zantac.  She recently got some [employer] acid reducer.  We discussed utilizing a well-known PPI or its generic equivalent.  We discussed how to take it.  Were expectant that this will help her symptoms.  Vital signs: BP (!) 145/74   Pulse (!) 103   Temp 98.1 F (36.7 C)   Resp 13   Ht 5\' 5"  (1.651 m)   Wt 118 lb 12.8 oz (53.9 kg)   SpO2 96%   BMI 19.77 kg/m    Physical Exam: Constitutional: Appears well. Abdomen: Soft and nontender. Skin: All the incisions are clean, dry and intact.  The Dermabond is intact and pristine.  Assessment/Plan: This is a 69 y.o. female 8 days s/p robotic cholecystectomy for mild chronic cholecystitis.  Patient Active Problem List   Diagnosis Date Noted  . Encounter for long-term (current) use of medications 08/28/2018  . Vitamin D deficiency 08/28/2018  . Dysuria 08/28/2018  . Acute midline low back pain without sciatica 04/24/2018  . Acute recurrent sinusitis 02/11/2018  . Chronic obstructive pulmonary disease ([place]) 02/11/2018  . Nicotine dependence, cigarettes, uncomplicated [id number]  . Cough 10/15/2017  . Gallstones 10/15/2017  . Abnormal renal function 10/15/2017  . Vertigo 09/23/2017  . Generalized anxiety disorder 09/23/2017  . Hyperlipidemia 07/23/2017  . Mild intermittent asthma without complication [id number]  . Essential hypertension 04/23/2017  . Acute non-recurrent maxillary sinusitis 04/23/2017  . Menopause 07/26/2016  . Vaginal atrophy 07/26/2016  . Female dyspareunia 07/26/2016  . Microscopic hematuria  07/28/2014    -Our doors will always be open for any further questions or concerns regarding her pyrosis and any assistance we can be.  She will follow up with [place] as needed.   Ronny Bacon M.D., FACS 03/06/2019, 3:31 PM

## 2019-03-06 NOTE — Patient Instructions (Addendum)
May try Nexium(Esomeprazole) once daily on an empty stomach to help with reflux.  You may try eating a variety of foods and listen to your body to see what it can tolerate. Add foods slowly.   Follow-up with our office as needed.  Please call and ask to speak with a nurse if you develop questions or concerns.  GENERAL POST-OPERATIVE PATIENT INSTRUCTIONS   WOUND CARE INSTRUCTIONS: If the wound becomes bright red and painful or starts to drain infected material that is not clear, please contact your physician immediately.  If the wound is mildly pink and has a thick firm ridge underneath it, this is normal, and is referred to as a healing ridge.  This will resolve over the next 4-6 weeks.  DIET:  You may eat any foods that you can tolerate.  It is a good idea to eat a high fiber diet and take in plenty of fluids to prevent constipation.  If you do become constipated you may want to take a mild laxative or take ducolax tablets on a daily basis until your bowel habits are regular.  Constipation can be very uncomfortable, along with straining, after recent surgery.  ACTIVITY:  You are encouraged to cough and deep breath or use your incentive spirometer if you were given one, every 15-30 minutes when awake.  This will help prevent respiratory complications and low grade fevers post-operatively if you had a general anesthetic.  You may want to hug a pillow when coughing and sneezing to add additional support to the surgical area, if you had abdominal or chest surgery, which will decrease pain during these times.  You are encouraged to walk and engage in light activity for the next two weeks.  You should not lift more than 20 pounds for four to six weeks after surgery as it could put you at increased risk for complications.  Twenty pounds is roughly equivalent to a plastic bag of groceries. At that time- Listen to your body when lifting, if you have pain when lifting, stop and then try again in a few days.  Soreness after doing exercises or activities of daily living is normal as you get back in to your normal routine.  MEDICATIONS:  Try to take narcotic medications and anti-inflammatory medications, such as tylenol, ibuprofen, naprosyn, etc., with food.  This will minimize stomach upset from the medication.  Should you develop nausea and vomiting from the pain medication, or develop a rash, please discontinue the medication and contact your physician.  You should not drive, make important decisions, or operate machinery when taking narcotic pain medication.  SUNBLOCK Use sun block to incision area over the next year if this area will be exposed to sun. This helps decrease scarring and will allow you avoid a permanent darkened area over your incision.  QUESTIONS:  Please feel free to call our office if you have any questions, and we will be glad to assist you.

## 2019-03-11 ENCOUNTER — Ambulatory Visit: Payer: PPO | Admitting: Internal Medicine

## 2019-03-13 DIAGNOSIS — H40153 Residual stage of open-angle glaucoma, bilateral: Secondary | ICD-10-CM | POA: Diagnosis not present

## 2019-03-24 ENCOUNTER — Telehealth: Payer: Self-pay

## 2019-03-24 NOTE — Telephone Encounter (Signed)
Screened and confirmed patient for PFT. Shari Gutierrez

## 2019-03-26 ENCOUNTER — Other Ambulatory Visit: Payer: Self-pay

## 2019-03-26 ENCOUNTER — Ambulatory Visit: Payer: PPO | Admitting: Internal Medicine

## 2019-03-26 DIAGNOSIS — R0602 Shortness of breath: Secondary | ICD-10-CM

## 2019-03-26 LAB — PULMONARY FUNCTION TEST

## 2019-03-29 NOTE — Procedures (Signed)
[place], [id number]  DATE OF SERVICE: March 26, 2019  Complete Pulmonary Function Testing Interpretation:  FINDINGS:  Forced vital capacity is mildly decreased.  FEV1 is 1.58 L which is 68% of predicted and is mildly decreased.  FEV1 FVC ratio is mildly decreased.  Postbronchodilator no significant improvement in the FEV1 clinical improvement may occur.  Total lung capacity is normal residual volume is increased residual volume total lung capacity ratio is increased FRC is increased.  DLCO mildly decreased and normal when corrected for alveolar volume.  IMPRESSION:  Study is consistent with mild obstructive lung disease.  There is also mild reduction of the DLCO clinical correlation is recommended  Allyne Gee, MD [facility] Pulmonary Critical Care Medicine Sleep Medicine

## 2019-04-01 ENCOUNTER — Other Ambulatory Visit: Payer: Self-pay

## 2019-04-01 MED ORDER — FENOFIBRATE 160 MG PO TABS: 160.0000 mg | ORAL_TABLET | Freq: Every day | ORAL | 1 refills | Status: DC

## 2019-04-08 ENCOUNTER — Telehealth: Payer: Self-pay

## 2019-04-08 NOTE — Telephone Encounter (Signed)
Called lmom informing patient of appointment on 04/10/2019. klh

## 2019-04-09 ENCOUNTER — Telehealth: Payer: Self-pay

## 2019-04-09 NOTE — Telephone Encounter (Signed)
Patient lmom confirming appointment on 04/10/2019. klh

## 2019-04-10 ENCOUNTER — Other Ambulatory Visit: Payer: Self-pay

## 2019-04-10 ENCOUNTER — Ambulatory Visit (INDEPENDENT_AMBULATORY_CARE_PROVIDER_SITE_OTHER): Payer: PPO | Admitting: Internal Medicine

## 2019-04-10 ENCOUNTER — Encounter: Payer: Self-pay | Admitting: Internal Medicine

## 2019-04-10 VITALS — BP 120/80 | HR 86 | Temp 97.4°F | Resp 16 | Ht 60.0 in | Wt 124.0 lb

## 2019-04-10 DIAGNOSIS — I10 Essential (primary) hypertension: Secondary | ICD-10-CM

## 2019-04-10 DIAGNOSIS — J4520 Mild intermittent asthma, uncomplicated: Secondary | ICD-10-CM | POA: Diagnosis not present

## 2019-04-10 DIAGNOSIS — F17210 Nicotine dependence, cigarettes, uncomplicated: Secondary | ICD-10-CM

## 2019-04-10 DIAGNOSIS — J449 Chronic obstructive pulmonary disease, unspecified: Secondary | ICD-10-CM | POA: Diagnosis not present

## 2019-04-10 MED ORDER — SPIRIVA RESPIMAT 2.5 MCG/ACT IN AERS: 1.0000 | INHALATION_SPRAY | Freq: Every day | RESPIRATORY_TRACT | 3 refills | Status: DC

## 2019-04-10 NOTE — Progress Notes (Signed)
[facility] [place], [place] [address]  Pulmonary Sleep Medicine   Office Visit Note  Patient Name: Shari Gutierrez DOB: 1950/09/26 MRN [id number]  Date of Service: 04/10/2019  Complaints/HPI: Pt is here for pulmonary follow up.  She had a PFT done on 03/26/19, it shows mild Obstructive disease, which is improved from previous pft that showed moderate disease. She reports she is using her inhalers as prescribed. She currently uses Symbicort and Spiriva.  She denies and chest pain, sob or palpitations currently.    ROS  General: (-) fever, (-) chills, (-) night sweats, (-) weakness Skin: (-) rashes, (-) itching,. Eyes: (-) visual changes, (-) redness, (-) itching. Nose and Sinuses: (-) nasal stuffiness or itchiness, (-) postnasal drip, (-) nosebleeds, (-) sinus trouble. Mouth and Throat: (-) sore throat, (-) hoarseness. Neck: (-) swollen glands, (-) enlarged thyroid, (-) neck pain. Respiratory: - cough, (-) bloody sputum, - shortness of breath, - wheezing. Cardiovascular: - ankle swelling, (-) chest pain. Lymphatic: (-) lymph node enlargement. Neurologic: (-) numbness, (-) tingling. Psychiatric: (-) anxiety, (-) depression   Current Medication: Outpatient Encounter Medications as of 04/10/2019  Medication Sig  . albuterol (VENTOLIN HFA) 108 (90 Base) MCG/ACT inhaler Inhale 1-2 puffs into the lungs every 6 (six) hours as needed for wheezing or shortness of breath.  . ALPRAZolam (XANAX) 0.25 MG tablet Take 1 tablet (0.25 mg total) by mouth at bedtime as needed for anxiety.  . budesonide-formoterol (SYMBICORT) 160-4.5 MCG/ACT inhaler Inhale 2 puffs into the lungs 2 (two) times daily.  . cholecalciferol (VITAMIN D) 1000 UNITS tablet Take 1,000 Units by mouth daily.   Marland Kitchen conjugated estrogens (PREMARIN) vaginal cream Place [id number] Applicatorfuls vaginally 2 (two) times a week.  . fenofibrate 160 MG tablet Take 1 tablet (160 mg total) by mouth daily.  Marland Kitchen ibuprofen  (ADVIL) 800 MG tablet Take 1 tablet (800 mg total) by mouth every 8 (eight) hours as needed.  . latanoprost (XALATAN) 0.005 % ophthalmic solution Place 1 drop into both eyes at bedtime.   . meclizine (ANTIVERT) 25 MG tablet TAKE 1 TABLET (25 MG TOTAL) BY MOUTH 2 (TWO) TIMES DAILY AS NEEDED FOR DIZZINESS.  . metoprolol tartrate (LOPRESSOR) 25 MG tablet Take 1 tablet (25 mg total) by mouth 2 (two) times daily.  . Multiple Vitamin (MULTIVITAMIN WITH MINERALS) TABS tablet Take 1 tablet by mouth 3 (three) times a week.  . Tiotropium Bromide Monohydrate (SPIRIVA RESPIMAT) 2.5 MCG/ACT AERS Inhale 1 Inhaler into the lungs daily.   No facility-administered encounter medications on file as of 04/10/2019.    Surgical History: Past Surgical History:  Procedure Laterality Date  . APPENDECTOMY  1989  . CHOLECYSTECTOMY  02/26/2019  . COLONOSCOPY  2016  . OOPHORECTOMY  1989    Medical History: Past Medical History:  Diagnosis Date  . Anxiety   . COPD (chronic obstructive pulmonary disease) ([place])   . 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 Gall stones   . Hematuria   . Hyperlipidemia   . Hypertension   . Reflux   . Sinusitis   . Vitamin D deficiency     Family History: Family History  Problem Relation Age of Onset  . Kidney failure Mother   . Cancer Father        Gastric  . Breast cancer Neg Hx   . Ovarian cancer Neg Hx   . Colon cancer Neg Hx   . Heart disease Neg Hx   . Diabetes Neg Hx  Social History: Social History   Socioeconomic History  . Marital status: Married    Spouse name: Not on file  . Number of children: Not on file  . Years of education: Not on file  . Highest education level: Not on file  Occupational History  . Not on file  Tobacco Use  . Smoking status: Current Every Day Smoker    Packs/day: 1.00    Years: 30.00    Pack years: 30.00    Types: Cigarettes    Start date: 07/28/1979  . Smokeless tobacco: Never Used  . Tobacco comment: pt is using a patch to help quit   Substance and Sexual Activity  . Alcohol use: Yes    Alcohol/week: 0.0 standard drinks    Comment: rarely  . Drug use: No  . Sexual activity: Yes    Birth control/protection: Post-menopausal  Other Topics Concern  . Not on file  Social History Narrative  . Not on file   Social Determinants of Health   Financial Resource Strain:   . Difficulty of Paying Living Expenses:   Food Insecurity:   . Worried About Charity fundraiser in the Last Year:   . Arboriculturist in the Last Year:   Transportation Needs:   . Film/video editor (Medical):   Marland Kitchen Lack of Transportation (Non-Medical):   Physical Activity:   . Days of Exercise per Week:   . Minutes of Exercise per Session:   Stress:   . Feeling of Stress :   Social Connections:   . Frequency of Communication with Friends and Family:   . Frequency of Social Gatherings with Friends and Family:   . Attends Religious Services:   . Active Member of Clubs or Organizations:   . Attends Archivist Meetings:   Marland Kitchen Marital Status:   Intimate Partner Violence:   . Fear of Current or Ex-Partner:   . Emotionally Abused:   Marland Kitchen Physically Abused:   . Sexually Abused:     Vital Signs: Blood pressure 120/80, pulse 86, temperature (!) 97.4 F (36.3 C), resp. rate 16, height 5' (1.524 m), weight 124 lb (56.2 kg), SpO2 96 %.  Examination: General Appearance: The patient is well-developed, well-nourished, and in no distress. Skin: Gross inspection of skin unremarkable. Head: normocephalic, no gross deformities. Eyes: no gross deformities noted. ENT: ears appear grossly normal no exudates. Neck: Supple. No thyromegaly. No LAD. Respiratory: clear bilateraly. Cardiovascular: Normal S1 and S2 without murmur or rub. Extremities: No cyanosis. pulses are equal. Neurologic: Alert and oriented. No involuntary movements.  LABS: Recent Results (from the past 2160 hour(s))  Vitamin D 1,25 dihydroxy     Status: None   Collection Time:  02/13/19  9:31 AM  Result Value Ref Range   Vitamin D 1, 25 (OH)2 Total 43 pg/mL    Comment: Reference Range: Adults: 21 - 65    Vitamin D2 1, 25 (OH)2 <10 pg/mL    Comment: This test was developed and its performance characteristics determined by [facility]. It has not been cleared or approved by the Food and Drug Administration.    Vitamin D3 1, 25 (OH)2 40 pg/mL    Comment: This test was developed and its performance characteristics determined by [facility]. It has not been cleared or approved by the Food and Drug Administration.   Lipid panel     Status: Abnormal   Collection Time: 02/13/19  9:31 AM  Result Value Ref Range   Cholesterol, Total 171 100 -  199 mg/dL   Triglycerides 75 0 - 149 mg/dL   HDL 31 (L) >39 mg/dL   VLDL Cholesterol Cal 14 5 - 40 mg/dL   LDL Chol Calc (NIH) 126 (H) 0 - 99 mg/dL   Chol/HDL Ratio 5.5 (H) 0.0 - 4.4 ratio    Comment:                                   T. Chol/HDL Ratio                                             Men  Women                               1/2 Avg.Risk  3.4    3.3                                   Avg.Risk  5.0    4.4                                2X Avg.Risk  9.6    7.1                                3X Avg.Risk 23.4   11.0   TSH     Status: None   Collection Time: 02/13/19  9:31 AM  Result Value Ref Range   TSH 2.410 0.450 - 4.500 uIU/mL  T4, free     Status: None   Collection Time: 02/13/19  9:31 AM  Result Value Ref Range   Free T4 1.18 0.82 - 1.77 ng/dL  Comprehensive metabolic panel     Status: Abnormal   Collection Time: 02/13/19  9:31 AM  Result Value Ref Range   Glucose 96 65 - 99 mg/dL   BUN 18 8 - 27 mg/dL   Creatinine, Ser 1.16 (H) 0.57 - 1.00 mg/dL   GFR calc non Af Amer 48 (L) >59 mL/min/1.73   GFR calc Af Amer 56 (L) >59 mL/min/1.73   BUN/Creatinine Ratio 16 12 - 28   Sodium 139 134 - 144 mmol/L   Potassium 4.8 3.5 - 5.2 mmol/L   Chloride 103 96 - 106 mmol/L   CO2 22 20 - 29 mmol/L   Calcium 9.9 8.7 -  10.3 mg/dL   Total Protein 7.2 6.0 - 8.5 g/dL   Albumin 4.6 3.8 - 4.8 g/dL   Globulin, Total 2.6 1.5 - 4.5 g/dL   Albumin/Globulin Ratio 1.8 1.2 - 2.2   Bilirubin Total 0.7 0.0 - 1.2 mg/dL   Alkaline Phosphatase 50 39 - 117 IU/L   AST 29 0 - 40 IU/L   ALT 19 0 - 32 IU/L  CBC with Differential/Platelet     Status: Abnormal   Collection Time: 02/13/19  9:31 AM  Result Value Ref Range   WBC 8.4 3.4 - 10.8 x10E3/uL   RBC 4.29 3.77 - 5.28 x10E6/uL   Hemoglobin 13.8 11.1 - 15.9 g/dL   Hematocrit 41.0 34.0 - 46.6 %   MCV 96 79 -  97 fL   MCH 32.2 26.6 - 33.0 pg   MCHC 33.7 31.5 - 35.7 g/dL   RDW 12.5 11.7 - 15.4 %   Platelets 380 150 - 450 x10E3/uL   Neutrophils 50 Not Estab. %   Lymphs 35 Not Estab. %   Monocytes 12 Not Estab. %   Eos 2 Not Estab. %   Basos 1 Not Estab. %   Neutrophils Absolute 4.1 1.4 - 7.0 x10E3/uL   Lymphocytes Absolute 2.9 0.7 - 3.1 x10E3/uL   Monocytes Absolute 1.0 (H) 0.1 - 0.9 x10E3/uL   EOS (ABSOLUTE) 0.2 0.0 - 0.4 x10E3/uL   Basophils Absolute 0.1 0.0 - 0.2 x10E3/uL   Immature Granulocytes 0 Not Estab. %   Immature Grans (Abs) 0.0 0.0 - 0.1 x10E3/uL  SARS CORONAVIRUS 2 (TAT 6-24 HRS) Nasopharyngeal Nasopharyngeal Swab     Status: None   Collection Time: 02/24/19 11:48 AM   Specimen: Nasopharyngeal Swab  Result Value Ref Range   SARS Coronavirus 2 NEGATIVE NEGATIVE    Comment: (NOTE) SARS-CoV-2 target nucleic acids are NOT DETECTED. The SARS-CoV-2 RNA is generally detectable in upper and lower respiratory specimens during the acute phase of infection. Negative results do not preclude SARS-CoV-2 infection, do not rule out co-infections with other pathogens, and should not be used as the sole basis for treatment or other patient management decisions. Negative results must be combined with clinical observations, patient history, and epidemiological information. The expected result is Negative. Fact Sheet for  Patients: SugarRoll.be Fact Sheet for Healthcare Providers: https://www.woods-mathews.com/ This test is not yet approved or cleared by the [place] FDA and  has been authorized for detection and/or diagnosis of SARS-CoV-2 by FDA under an Emergency Use Authorization (EUA). This EUA will remain  in effect (meaning this test can be used) for the duration of the COVID-19 declaration under Section 56 4(b)(1) of the Act, 21 U.S.C. section 360bbb-3(b)(1), unless the authorization is terminated or revoked sooner. Performed at [facility], [place] [address] [place], [place] [address]   Surgical pathology     Status: None   Collection Time: 02/26/19  8:25 AM  Result Value Ref Range   SURGICAL PATHOLOGY      SURGICAL PATHOLOGY CASE: [id number] PATIENT: Journei Zilka Surgical Pathology Report     Specimen Submitted: A. Gallbladder  Clinical History: Gallstones      DIAGNOSIS: A. GALLBLADDER, CHOLECYSTECTOMY: - MILD CHRONIC CHOLECYSTITIS WITH CHOLELITHIASIS. - NEGATIVE FOR MALIGNANCY.   GROSS DESCRIPTION: A. Labeled: Gallbladder. Received: In formalin Size of specimen: 6.0 x 2.0 x 2.0 cm Specimen integrity: Intact External surface: Green, smooth, and glistering Wall thickness: 0.2 cm Mucosa: Green and trabeculated Cystic duct: Open, 0.3 cm in diameter Bile present: Green and viscous Stones present: Single green oval firm stone measuring 1.3 x 1.0 x 1.0 cm Other findings: None  Block summary: 1 -representative sections including cystic duct margin   Final Diagnosis performed by Betsy Pries, MD.   Electronically signed 02/27/2019 7:55:38AM The electronic signature indicates that the named Attending Pathologist has evaluated the specimen Techn ical component performed at [facility], [address] [place], [place] [address] Lab: [phone] Dir: Rush Farmer, MD, MMM  Professional component performed at [facility],  [facility], [place], [place], [place] [address] Lab: [phone] Dir: Dellia Nims. Reuel Derby, MD     Radiology: No results found.  No results found.  No results found.    Assessment and Plan: Patient Active Problem List   Diagnosis Date Noted  .  Status post laparoscopic cholecystectomy 03/06/2019  . Encounter for long-term (current) use of medications 08/28/2018  . Vitamin D deficiency 08/28/2018  . Dysuria 08/28/2018  . Acute midline low back pain without sciatica 04/24/2018  . Acute recurrent sinusitis 02/11/2018  . Chronic obstructive pulmonary disease ([place]) 02/11/2018  . Nicotine dependence, cigarettes, uncomplicated [id number]  . Cough 10/15/2017  . Abnormal renal function 10/15/2017  . Vertigo 09/23/2017  . Generalized anxiety disorder 09/23/2017  . Hyperlipidemia 07/23/2017  . Mild intermittent asthma without complication [id number]  . Essential hypertension 04/23/2017  . Acute non-recurrent maxillary sinusitis 04/23/2017  . Menopause 07/26/2016  . Vaginal atrophy 07/26/2016  . Female dyspareunia 07/26/2016  . Microscopic hematuria 07/28/2014    1. Chronic obstructive pulmonary disease, unspecified COPD type ([place]) Good control of symptoms.  Continue current management with symbicort and spiriva.  PFT improved from last check.  2. Mild intermittent asthma, unspecified whether complicated Continue inhalers as before.  3. Essential hypertension Controled, continue current mgmt.  4. Nicotine dependence, cigarettes, uncomplicated Smoking cessation counseling: 1. Pt acknowledges the risks of long term smoking, she will try to quite smoking. 2. Options for different medications including nicotine products, chewing gum, patch etc, Wellbutrin and Chantix is discussed 3. Goal and date of compete cessation is discussed 4. Total time spent in smoking cessation is 15 min.   General Counseling: I have discussed the findings of the evaluation  and examination with Luisana.  I have also discussed any further diagnostic evaluation thatmay be needed or ordered today. Bertina verbalizes understanding of the findings of todays visit. We also reviewed her medications today and discussed drug interactions and side effects including but not limited excessive drowsiness and altered mental states. We also discussed that there is always a risk not just to her but also people around her. she has been encouraged to call the office with any questions or concerns that should arise related to todays visit.  No orders of the defined types were placed in this encounter.    Time spent: 30 This patient was seen by Orson Gear AGNP-C in Collaboration with Dr. Devona Konig as a part of collaborative care agreement.   I have personally obtained a history, examined the patient, evaluated laboratory and imaging results, formulated the assessment and plan and placed orders.    Allyne Gee, MD [facility] Pulmonary and Critical Care Sleep medicine

## 2019-04-16 ENCOUNTER — Ambulatory Visit (INDEPENDENT_AMBULATORY_CARE_PROVIDER_SITE_OTHER): Payer: PPO | Admitting: Adult Health

## 2019-04-16 ENCOUNTER — Encounter: Payer: Self-pay | Admitting: Adult Health

## 2019-04-16 ENCOUNTER — Other Ambulatory Visit: Payer: Self-pay

## 2019-04-16 VITALS — BP 125/83 | HR 85 | Temp 93.7°F | Resp 16 | Ht 65.0 in | Wt 123.6 lb

## 2019-04-16 DIAGNOSIS — F17210 Nicotine dependence, cigarettes, uncomplicated: Secondary | ICD-10-CM | POA: Diagnosis not present

## 2019-04-16 DIAGNOSIS — J449 Chronic obstructive pulmonary disease, unspecified: Secondary | ICD-10-CM

## 2019-04-16 DIAGNOSIS — J4520 Mild intermittent asthma, uncomplicated: Secondary | ICD-10-CM

## 2019-04-16 DIAGNOSIS — I10 Essential (primary) hypertension: Secondary | ICD-10-CM

## 2019-04-16 MED ORDER — TRELEGY ELLIPTA 100-62.5-25 MCG/INH IN AEPB: 1.0000 | INHALATION_SPRAY | Freq: Every day | RESPIRATORY_TRACT | 1 refills | Status: DC

## 2019-04-16 NOTE — Progress Notes (Signed)
[facility] [place], [place] [address]  Pulmonary Sleep Medicine   Office Visit Note  Patient Name: Shari Gutierrez DOB: [birth year] MRN [id number]  Date of Service: 05/11/2019  Complaints/HPI: Pt is here for follow up.  She has multiple questions from our last visit, about her blood pressure, and her inhalers.  She is concerned that her inhalers are giving her acid reflux symptoms.  She also feels shaky after taking it.  She would like to try something different.  Her questions about blood pressure and eye sight changes were addressed at this visit.  ROS  General: (-) fever, (-) chills, (-) night sweats, (-) weakness Skin: (-) rashes, (-) itching,. Eyes: (-) visual changes, (-) redness, (-) itching. Nose and Sinuses: (-) nasal stuffiness or itchiness, (-) postnasal drip, (-) nosebleeds, (-) sinus trouble. Mouth and Throat: (-) sore throat, (-) hoarseness. Neck: (-) swollen glands, (-) enlarged thyroid, (-) neck pain. Respiratory: - cough, (-) bloody sputum, - shortness of breath, - wheezing. Cardiovascular: - ankle swelling, (-) chest pain. Lymphatic: (-) lymph node enlargement. Neurologic: (-) numbness, (-) tingling. Psychiatric: (-) anxiety, (-) depression   Current Medication: Outpatient Encounter Medications as of 04/16/2019  Medication Sig  . albuterol (VENTOLIN HFA) 108 (90 Base) MCG/ACT inhaler Inhale 1-2 puffs into the lungs every 6 (six) hours as needed for wheezing or shortness of breath.  . ALPRAZolam (XANAX) 0.25 MG tablet Take 1 tablet (0.25 mg total) by mouth at bedtime as needed for anxiety.  . budesonide-formoterol (SYMBICORT) 160-4.5 MCG/ACT inhaler Inhale 2 puffs into the lungs 2 (two) times daily.  . cholecalciferol (VITAMIN D) 1000 UNITS tablet Take 1,000 Units by mouth daily.   Marland Kitchen conjugated estrogens (PREMARIN) vaginal cream Place [id number] Applicatorfuls vaginally 2 (two) times a week.  . fenofibrate 160 MG tablet Take 1 tablet (160 mg  total) by mouth daily.  Marland Kitchen ibuprofen (ADVIL) 800 MG tablet Take 1 tablet (800 mg total) by mouth every 8 (eight) hours as needed.  . latanoprost (XALATAN) 0.005 % ophthalmic solution Place 1 drop into both eyes at bedtime.   . meclizine (ANTIVERT) 25 MG tablet TAKE 1 TABLET (25 MG TOTAL) BY MOUTH 2 (TWO) TIMES DAILY AS NEEDED FOR DIZZINESS.  . metoprolol tartrate (LOPRESSOR) 25 MG tablet Take 1 tablet (25 mg total) by mouth 2 (two) times daily.  . Multiple Vitamin (MULTIVITAMIN WITH MINERALS) TABS tablet Take 1 tablet by mouth 3 (three) times a week.  . Tiotropium Bromide Monohydrate (SPIRIVA RESPIMAT) 2.5 MCG/ACT AERS Inhale 1 Inhaler into the lungs daily.  . Fluticasone-Umeclidin-Vilant (TRELEGY ELLIPTA) 100-62.5-25 MCG/INH AEPB Inhale 1 puff into the lungs daily.   No facility-administered encounter medications on file as of 04/16/2019.    Surgical History: Past Surgical History:  Procedure Laterality Date  . APPENDECTOMY  1989  . CHOLECYSTECTOMY  02/26/2019  . COLONOSCOPY  2016  . OOPHORECTOMY  1989    Medical History: Past Medical History:  Diagnosis Date  . Anxiety   . COPD (chronic obstructive pulmonary disease) ([place])   . 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 Gall stones   . Hematuria   . Hyperlipidemia   . Hypertension   . Reflux   . Sinusitis   . Vitamin D deficiency     Family History: Family History  Problem Relation Age of Onset  . Kidney failure Mother   . Cancer Father        Gastric  . Breast cancer Neg Hx   . Ovarian cancer Neg  Hx   . Colon cancer Neg Hx   . Heart disease Neg Hx   . Diabetes Neg Hx     Social History: Social History   Socioeconomic History  . Marital status: Married    Spouse name: Not on file  . Number of children: Not on file  . Years of education: Not on file  . Highest education level: Not on file  Occupational History  . Not on file  Tobacco Use  . Smoking status: Current Every Day Smoker    Packs/day: 1.00    Years: 30.00    Pack  years: 30.00    Types: Cigarettes    Start date: 07/28/1979  . Smokeless tobacco: Never Used  . Tobacco comment: pt is using a patch to help quit  Substance and Sexual Activity  . Alcohol use: Yes    Alcohol/week: 0.0 standard drinks    Comment: rarely  . Drug use: No  . Sexual activity: Yes    Birth control/protection: Post-menopausal  Other Topics Concern  . Not on file  Social History Narrative  . Not on file   Social Determinants of Health   Financial Resource Strain:   . Difficulty of Paying Living Expenses:   Food Insecurity:   . Worried About Charity fundraiser in the Last Year:   . Arboriculturist in the Last Year:   Transportation Needs:   . Film/video editor (Medical):   Marland Kitchen Lack of Transportation (Non-Medical):   Physical Activity:   . Days of Exercise per Week:   . Minutes of Exercise per Session:   Stress:   . Feeling of Stress :   Social Connections:   . Frequency of Communication with Friends and Family:   . Frequency of Social Gatherings with Friends and Family:   . Attends Religious Services:   . Active Member of Clubs or Organizations:   . Attends Archivist Meetings:   Marland Kitchen Marital Status:   Intimate Partner Violence:   . Fear of Current or Ex-Partner:   . Emotionally Abused:   Marland Kitchen Physically Abused:   . Sexually Abused:     Vital Signs: Blood pressure 125/83, pulse 85, temperature (!) 93.7 F (34.3 C), resp. rate 16, height 5\' 5"  (1.651 m), weight 123 lb 9.6 oz (56.1 kg), SpO2 97 %.  Examination: General Appearance: The patient is well-developed, well-nourished, and in no distress. Skin: Gross inspection of skin unremarkable. Head: normocephalic, no gross deformities. Eyes: no gross deformities noted. ENT: ears appear grossly normal no exudates. Neck: Supple. No thyromegaly. No LAD. Respiratory: clear bilaterally. Cardiovascular: Normal S1 and S2 without murmur or rub. Extremities: No cyanosis. pulses are equal. Neurologic: Alert  and oriented. No involuntary movements.  LABS: Recent Results (from the past 2160 hour(s))  Vitamin D 1,25 dihydroxy     Status: None   Collection Time: 02/13/19  9:31 AM  Result Value Ref Range   Vitamin D 1, 25 (OH)2 Total 43 pg/mL    Comment: Reference Range: Adults: 21 - 65    Vitamin D2 1, 25 (OH)2 <10 pg/mL    Comment: This test was developed and its performance characteristics determined by [facility]. It has not been cleared or approved by the Food and Drug Administration.    Vitamin D3 1, 25 (OH)2 40 pg/mL    Comment: This test was developed and its performance characteristics determined by [facility]. It has not been cleared or approved by the Food and Drug Administration.  Lipid panel     Status: Abnormal   Collection Time: 02/13/19  9:31 AM  Result Value Ref Range   Cholesterol, Total 171 100 - 199 mg/dL   Triglycerides 75 0 - 149 mg/dL   HDL 31 (L) >39 mg/dL   VLDL Cholesterol Cal 14 5 - 40 mg/dL   LDL Chol Calc (NIH) 126 (H) 0 - 99 mg/dL   Chol/HDL Ratio 5.5 (H) 0.0 - 4.4 ratio    Comment:                                   T. Chol/HDL Ratio                                             Men  Women                               1/2 Avg.Risk  3.4    3.3                                   Avg.Risk  5.0    4.4                                2X Avg.Risk  9.6    7.1                                3X Avg.Risk 23.4   11.0   TSH     Status: None   Collection Time: 02/13/19  9:31 AM  Result Value Ref Range   TSH 2.410 0.450 - 4.500 uIU/mL  T4, free     Status: None   Collection Time: 02/13/19  9:31 AM  Result Value Ref Range   Free T4 1.18 0.82 - 1.77 ng/dL  Comprehensive metabolic panel     Status: Abnormal   Collection Time: 02/13/19  9:31 AM  Result Value Ref Range   Glucose 96 65 - 99 mg/dL   BUN 18 8 - 27 mg/dL   Creatinine, Ser 1.16 (H) 0.57 - 1.00 mg/dL   GFR calc non Af Amer 48 (L) >59 mL/min/1.73   GFR calc Af Amer 56 (L) >59 mL/min/1.73   BUN/Creatinine Ratio  16 12 - 28   Sodium 139 134 - 144 mmol/L   Potassium 4.8 3.5 - 5.2 mmol/L   Chloride 103 96 - 106 mmol/L   CO2 22 20 - 29 mmol/L   Calcium 9.9 8.7 - 10.3 mg/dL   Total Protein 7.2 6.0 - 8.5 g/dL   Albumin 4.6 3.8 - 4.8 g/dL   Globulin, Total 2.6 1.5 - 4.5 g/dL   Albumin/Globulin Ratio 1.8 1.2 - 2.2   Bilirubin Total 0.7 0.0 - 1.2 mg/dL   Alkaline Phosphatase 50 39 - 117 IU/L   AST 29 0 - 40 IU/L   ALT 19 0 - 32 IU/L  CBC with Differential/Platelet     Status: Abnormal   Collection Time: 02/13/19  9:31 AM  Result Value Ref Range   WBC 8.4 3.4 - 10.8 x10E3/uL  RBC 4.29 3.77 - 5.28 x10E6/uL   Hemoglobin 13.8 11.1 - 15.9 g/dL   Hematocrit 41.0 34.0 - 46.6 %   MCV 96 79 - 97 fL   MCH 32.2 26.6 - 33.0 pg   MCHC 33.7 31.5 - 35.7 g/dL   RDW 12.5 11.7 - 15.4 %   Platelets 380 150 - 450 x10E3/uL   Neutrophils 50 Not Estab. %   Lymphs 35 Not Estab. %   Monocytes 12 Not Estab. %   Eos 2 Not Estab. %   Basos 1 Not Estab. %   Neutrophils Absolute 4.1 1.4 - 7.0 x10E3/uL   Lymphocytes Absolute 2.9 0.7 - 3.1 x10E3/uL   Monocytes Absolute 1.0 (H) 0.1 - 0.9 x10E3/uL   EOS (ABSOLUTE) 0.2 0.0 - 0.4 x10E3/uL   Basophils Absolute 0.1 0.0 - 0.2 x10E3/uL   Immature Granulocytes 0 Not Estab. %   Immature Grans (Abs) 0.0 0.0 - 0.1 x10E3/uL  SARS CORONAVIRUS 2 (TAT 6-24 HRS) Nasopharyngeal Nasopharyngeal Swab     Status: None   Collection Time: 02/24/19 11:48 AM   Specimen: Nasopharyngeal Swab  Result Value Ref Range   SARS Coronavirus 2 NEGATIVE NEGATIVE    Comment: (NOTE) SARS-CoV-2 target nucleic acids are NOT DETECTED. The SARS-CoV-2 RNA is generally detectable in upper and lower respiratory specimens during the acute phase of infection. Negative results do not preclude SARS-CoV-2 infection, do not rule out co-infections with other pathogens, and should not be used as the sole basis for treatment or other patient management decisions. Negative results must be combined with clinical  observations, patient history, and epidemiological information. The expected result is Negative. Fact Sheet for Patients: SugarRoll.be Fact Sheet for Healthcare Providers: https://www.woods-mathews.com/ This test is not yet approved or cleared by the [place] FDA and  has been authorized for detection and/or diagnosis of SARS-CoV-2 by FDA under an Emergency Use Authorization (EUA). This EUA will remain  in effect (meaning this test can be used) for the duration of the COVID-19 declaration under Section 56 4(b)(1) of the Act, 21 U.S.C. section 360bbb-3(b)(1), unless the authorization is terminated or revoked sooner. Performed at [facility], [place] [address] [place], [place] [address]   Surgical pathology     Status: None   Collection Time: 02/26/19  8:25 AM  Result Value Ref Range   SURGICAL PATHOLOGY      SURGICAL PATHOLOGY CASE: [id number] PATIENT: Shari Gutierrez Surgical Pathology Report     Specimen Submitted: A. Gallbladder  Clinical History: Gallstones      DIAGNOSIS: A. GALLBLADDER, CHOLECYSTECTOMY: - MILD CHRONIC CHOLECYSTITIS WITH CHOLELITHIASIS. - NEGATIVE FOR MALIGNANCY.   GROSS DESCRIPTION: A. Labeled: Gallbladder. Received: In formalin Size of specimen: 6.0 x 2.0 x 2.0 cm Specimen integrity: Intact External surface: Green, smooth, and glistering Wall thickness: 0.2 cm Mucosa: Green and trabeculated Cystic duct: Open, 0.3 cm in diameter Bile present: Green and viscous Stones present: Single green oval firm stone measuring 1.3 x 1.0 x 1.0 cm Other findings: None  Block summary: 1 -representative sections including cystic duct margin   Final Diagnosis performed by Betsy Pries, MD.   Electronically signed 02/27/2019 7:55:38AM The electronic signature indicates that the named Attending Pathologist has evaluated the specimen Techn ical component performed at [facility], [address]  [place], [place] [address] Lab: [phone] Dir: Rush Farmer, MD, MMM  Professional component performed at [facility], [facility], [place], [place], [place] [address] Lab: [phone] Dir: Dellia Nims. Reuel Derby, MD  Radiology: No results found.  No results found.  No results found.    Assessment and Plan: Patient Active Problem List   Diagnosis Date Noted  . Status post laparoscopic cholecystectomy 03/06/2019  . Encounter for long-term (current) use of medications 08/28/2018  . Vitamin D deficiency 08/28/2018  . Dysuria 08/28/2018  . Acute midline low back pain without sciatica 04/24/2018  . Acute recurrent sinusitis 02/11/2018  . Chronic obstructive pulmonary disease ([place]) 02/11/2018  . Nicotine dependence, cigarettes, uncomplicated [id number]  . Cough 10/15/2017  . Abnormal renal function 10/15/2017  . Vertigo 09/23/2017  . Generalized anxiety disorder 09/23/2017  . Hyperlipidemia 07/23/2017  . Mild intermittent asthma without complication [id number]  . Essential hypertension 04/23/2017  . Acute non-recurrent maxillary sinusitis 04/23/2017  . Menopause 07/26/2016  . Vaginal atrophy 07/26/2016  . Female dyspareunia 07/26/2016  . Microscopic hematuria 07/28/2014   1. Chronic obstructive pulmonary disease, unspecified COPD type ([place]) Start Trelegy as discussed. Follow up as scheduled.   2. Mild intermittent asthma, unspecified whether complicated Stable, continue present management.   3. Nicotine dependence, cigarettes, uncomplicated Smoking cessation counseling: 1. Pt acknowledges the risks of long term smoking, she will try to quite smoking. 2. Options for different medications including nicotine products, chewing gum, patch etc, Wellbutrin and Chantix is discussed 3. Goal and date of compete cessation is discussed 4. Total time spent in smoking cessation is 15 min.   4. Essential hypertension Controlled, continue present medications.     General Counseling: I have discussed the findings of the evaluation and examination with Shari Gutierrez.  I have also discussed any further diagnostic evaluation thatmay be needed or ordered today. Shari Gutierrez verbalizes understanding of the findings of todays visit. We also reviewed her medications today and discussed drug interactions and side effects including but not limited excessive drowsiness and altered mental states. We also discussed that there is always a risk not just to her but also people around her. she has been encouraged to call the office with any questions or concerns that should arise related to todays visit.  No orders of the defined types were placed in this encounter.    Time spent: 25 This patient was seen by Orson Gear AGNP-C in Collaboration with Dr. Devona Konig as a part of collaborative care agreement.   I have personally obtained a history, examined the patient, evaluated laboratory and imaging results, formulated the assessment and plan and placed orders.    Allyne Gee, MD [facility] Pulmonary and Critical Care Sleep medicine

## 2019-04-23 ENCOUNTER — Other Ambulatory Visit: Payer: Self-pay | Admitting: Nurse Practitioner

## 2019-04-23 ENCOUNTER — Telehealth: Payer: Self-pay

## 2019-04-23 DIAGNOSIS — J0191 Acute recurrent sinusitis, unspecified: Secondary | ICD-10-CM

## 2019-04-23 DIAGNOSIS — N390 Urinary tract infection, site not specified: Secondary | ICD-10-CM

## 2019-04-23 MED ORDER — NITROFURANTOIN MONOHYD MACRO 100 MG PO CAPS: 100.0000 mg | ORAL_CAPSULE | Freq: Two times a day (BID) | ORAL | 0 refills | Status: DC

## 2019-04-23 NOTE — Telephone Encounter (Signed)
Called lmom/lmom informing patient rx was sent to [facility] [address] and patient to take OTC AZO for bladder pain and muscle spasms. klh

## 2019-04-23 NOTE — Progress Notes (Signed)
Patient c/o uti symptoms. Started macrobid 100mg  twice daily for 7 days. Recommend OTC AZO to help with bladder pain and spasms. Prescription [facility] to [facility] [address].

## 2019-05-19 ENCOUNTER — Other Ambulatory Visit: Payer: Self-pay

## 2019-05-19 ENCOUNTER — Other Ambulatory Visit: Payer: Self-pay | Admitting: Obstetrics and Gynecology

## 2019-05-19 ENCOUNTER — Encounter: Payer: Self-pay | Admitting: Adult Health

## 2019-05-19 ENCOUNTER — Ambulatory Visit (INDEPENDENT_AMBULATORY_CARE_PROVIDER_SITE_OTHER): Payer: PPO | Admitting: Adult Health

## 2019-05-19 ENCOUNTER — Ambulatory Visit
Admission: RE | Admit: 2019-05-19 | Discharge: 2019-05-19 | Disposition: A | Discharge status: Home / Self Care | Payer: PPO | Source: Ambulatory Visit | Attending: Obstetrics and Gynecology | Admitting: Obstetrics and Gynecology

## 2019-05-19 VITALS — BP 130/80 | HR 92 | Temp 98.1°F | Resp 16 | Ht 65.0 in | Wt 123.0 lb

## 2019-05-19 DIAGNOSIS — N6489 Other specified disorders of breast: Secondary | ICD-10-CM

## 2019-05-19 DIAGNOSIS — I10 Essential (primary) hypertension: Secondary | ICD-10-CM | POA: Diagnosis not present

## 2019-05-19 DIAGNOSIS — Z01419 Encounter for gynecological examination (general) (routine) without abnormal findings: Secondary | ICD-10-CM | POA: Insufficient documentation

## 2019-05-19 DIAGNOSIS — B001 Herpesviral vesicular dermatitis: Secondary | ICD-10-CM

## 2019-05-19 DIAGNOSIS — R928 Other abnormal and inconclusive findings on diagnostic imaging of breast: Secondary | ICD-10-CM

## 2019-05-19 DIAGNOSIS — Z1231 Encounter for screening mammogram for malignant neoplasm of breast: Secondary | ICD-10-CM | POA: Insufficient documentation

## 2019-05-19 DIAGNOSIS — J449 Chronic obstructive pulmonary disease, unspecified: Secondary | ICD-10-CM

## 2019-05-19 MED ORDER — VALACYCLOVIR HCL 1 G PO TABS: 1000.0000 mg | ORAL_TABLET | Freq: Two times a day (BID) | ORAL | 0 refills | Status: DC

## 2019-05-19 NOTE — Progress Notes (Signed)
[facility] [place], [place] [address]  Internal MEDICINE  Office Visit Note  Patient Name: Shari Gutierrez  [id number]  [id number]  Date of Service: 05/19/2019  Chief Complaint  Patient presents with  . Rash    lefs side of lips      HPI Pt is here for a sick visit. She reports last night she noticed a small red, raised area at her lip line where her top lip meets her bottom lip.  She denies it being itchy or painful.  She reports she gets poison oak yearly.  This area feels "tight" and she states "Its just there".       Current Medication:  Outpatient Encounter Medications as of 05/19/2019  Medication Sig  . albuterol (VENTOLIN HFA) 108 (90 Base) MCG/ACT inhaler Inhale 1-2 puffs into the lungs every 6 (six) hours as needed for wheezing or shortness of breath.  . ALPRAZolam (XANAX) 0.25 MG tablet Take 1 tablet (0.25 mg total) by mouth at bedtime as needed for anxiety.  . budesonide-formoterol (SYMBICORT) 160-4.5 MCG/ACT inhaler Inhale 2 puffs into the lungs 2 (two) times daily.  . cholecalciferol (VITAMIN D) 1000 UNITS tablet Take 1,000 Units by mouth daily.   Marland Kitchen conjugated estrogens (PREMARIN) vaginal cream Place [id number] Applicatorfuls vaginally 2 (two) times a week.  . fenofibrate 160 MG tablet Take 1 tablet (160 mg total) by mouth daily.  . Fluticasone-Umeclidin-Vilant (TRELEGY ELLIPTA) 100-62.5-25 MCG/INH AEPB Inhale 1 puff into the lungs daily.  Marland Kitchen ibuprofen (ADVIL) 800 MG tablet Take 1 tablet (800 mg total) by mouth every 8 (eight) hours as needed.  . latanoprost (XALATAN) 0.005 % ophthalmic solution Place 1 drop into both eyes at bedtime.   . meclizine (ANTIVERT) 25 MG tablet TAKE 1 TABLET (25 MG TOTAL) BY MOUTH 2 (TWO) TIMES DAILY AS NEEDED FOR DIZZINESS.  . metoprolol tartrate (LOPRESSOR) 25 MG tablet Take 1 tablet (25 mg total) by mouth 2 (two) times daily.  . Multiple Vitamin (MULTIVITAMIN WITH MINERALS) TABS tablet Take 1 tablet by mouth 3  (three) times a week.  . nitrofurantoin, macrocrystal-monohydrate, (MACROBID) 100 MG capsule Take 1 capsule (100 mg total) by mouth 2 (two) times daily.  . Tiotropium Bromide Monohydrate (SPIRIVA RESPIMAT) 2.5 MCG/ACT AERS Inhale 1 Inhaler into the lungs daily.  . valACYclovir (VALTREX) 1000 MG tablet Take 1 tablet (1,000 mg total) by mouth 2 (two) times daily.   No facility-administered encounter medications on file as of 05/19/2019.      Medical History: Past Medical History:  Diagnosis Date  . Anxiety   . COPD (chronic obstructive pulmonary disease) ([place])   . 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 Gall stones   . Hematuria   . Hyperlipidemia   . Hypertension   . Reflux   . Sinusitis   . Vitamin D deficiency      Vital Signs: BP 130/80   Pulse 92   Temp 98.1 F (36.7 C)   Resp 16   Ht 5\' 5"  (1.651 m)   Wt 123 lb (55.8 kg)   SpO2 96%   BMI 20.47 kg/m    Review of Systems  Constitutional: Negative for chills, fatigue and unexpected weight change.  HENT: Negative for congestion, rhinorrhea, sneezing and sore throat.        Left side, lower lip redness.   Eyes: Negative for photophobia, pain and redness.  Respiratory: Negative for cough, chest tightness and shortness of breath.   Cardiovascular: Negative for chest pain and palpitations.  Gastrointestinal: Negative for abdominal pain, constipation, diarrhea, nausea and vomiting.  Endocrine: Negative.   Genitourinary: Negative for dysuria and frequency.  Musculoskeletal: Negative for arthralgias, back pain, joint swelling and neck pain.  Skin: Negative for rash.  Allergic/Immunologic: Negative.   Neurological: Negative for tremors and numbness.  Hematological: Negative for adenopathy. Does not bruise/bleed easily.  Psychiatric/Behavioral: Negative for behavioral problems and sleep disturbance. The patient is not nervous/anxious.     Physical Exam Vitals and nursing note reviewed.  Constitutional:      General: She is not in  acute distress.    Appearance: She is well-developed. She is not diaphoretic.  HENT:     Head: Normocephalic and atraumatic.      Mouth/Throat:     Pharynx: No oropharyngeal exudate.  Eyes:     Pupils: Pupils are equal, round, and reactive to light.  Neck:     Thyroid: No thyromegaly.     Vascular: No JVD.     Trachea: No tracheal deviation.  Cardiovascular:     Rate and Rhythm: Normal rate and regular rhythm.     Heart sounds: Normal heart sounds. No murmur. No friction rub. No gallop.   Pulmonary:     Effort: Pulmonary effort is normal. No respiratory distress.     Breath sounds: Normal breath sounds. No wheezing or rales.  Chest:     Chest wall: No tenderness.  Abdominal:     Palpations: Abdomen is soft.     Tenderness: There is no abdominal tenderness. There is no guarding.  Musculoskeletal:        General: Normal range of motion.     Cervical back: Normal range of motion and neck supple.  Lymphadenopathy:     Cervical: No cervical adenopathy.  Skin:    General: Skin is warm and dry.  Neurological:     Mental Status: She is alert and oriented to person, place, and time.     Cranial Nerves: No cranial nerve deficit.  Psychiatric:        Behavior: Behavior normal.        Thought Content: Thought content normal.        Judgment: Judgment normal.     Assessment/Plan: 1. Cold sore Start taking Valtrex as discussed.  - valACYclovir (VALTREX) 1000 MG tablet; Take 1 tablet (1,000 mg total) by mouth 2 (two) times daily.  Dispense: 20 tablet; Refill: 0  2. Chronic obstructive pulmonary disease, unspecified COPD type ([place]) Controlled, continue present management.   3. Essential hypertension Controlled, continue present management.   General Counseling: Jillyn verbalizes understanding of the findings of todays visit and agrees with plan of treatment. I have discussed any further diagnostic evaluation that may be needed or ordered today. We also reviewed her medications  today. she has been encouraged to call the office with any questions or concerns that should arise related to todays visit.   No orders of the defined types were placed in this encounter.   Meds ordered this encounter  Medications  . valACYclovir (VALTREX) 1000 MG tablet    Sig: Take 1 tablet (1,000 mg total) by mouth 2 (two) times daily.    Dispense:  20 tablet    Refill:  0    Time spent: 25 Minutes  This patient was seen by Orson Gear AGNP-C in Collaboration with Dr Lavera Guise as a part of collaborative care agreement.  Kendell Bane AGNP-C Internal Medicine

## 2019-05-27 ENCOUNTER — Other Ambulatory Visit: Payer: Self-pay

## 2019-05-27 MED ORDER — ALBUTEROL SULFATE HFA 108 (90 BASE) MCG/ACT IN AERS: 1.0000 | INHALATION_SPRAY | Freq: Four times a day (QID) | RESPIRATORY_TRACT | 3 refills | Status: AC | PRN

## 2019-05-29 ENCOUNTER — Ambulatory Visit
Admission: RE | Admit: 2019-05-29 | Discharge: 2019-05-29 | Disposition: A | Discharge status: Home / Self Care | Payer: PPO | Source: Ambulatory Visit | Attending: Obstetrics and Gynecology | Admitting: Obstetrics and Gynecology

## 2019-05-29 DIAGNOSIS — R928 Other abnormal and inconclusive findings on diagnostic imaging of breast: Secondary | ICD-10-CM

## 2019-05-29 DIAGNOSIS — N6489 Other specified disorders of breast: Secondary | ICD-10-CM

## 2019-05-29 DIAGNOSIS — R922 Inconclusive mammogram: Secondary | ICD-10-CM | POA: Diagnosis not present

## 2019-07-10 ENCOUNTER — Telehealth: Payer: Self-pay

## 2019-07-10 NOTE — Telephone Encounter (Signed)
Patient rescheduled appointment on 07/14/2019 to 08/11/2019. klh

## 2019-07-14 ENCOUNTER — Ambulatory Visit: Payer: PPO | Admitting: Internal Medicine

## 2019-08-04 DIAGNOSIS — H40153 Residual stage of open-angle glaucoma, bilateral: Secondary | ICD-10-CM | POA: Diagnosis not present

## 2019-08-07 ENCOUNTER — Telehealth: Payer: Self-pay

## 2019-08-07 NOTE — Telephone Encounter (Signed)
Confirmed appointment on 08/11/2019 and screened for covid. klh 

## 2019-08-11 ENCOUNTER — Encounter: Payer: Self-pay | Admitting: Internal Medicine

## 2019-08-11 ENCOUNTER — Ambulatory Visit: Payer: PPO | Admitting: Internal Medicine

## 2019-08-11 ENCOUNTER — Other Ambulatory Visit: Payer: Self-pay

## 2019-08-11 VITALS — BP 141/69 | HR 84 | Temp 97.6°F | Resp 16 | Ht 65.0 in | Wt 121.6 lb

## 2019-08-11 DIAGNOSIS — F17210 Nicotine dependence, cigarettes, uncomplicated: Secondary | ICD-10-CM

## 2019-08-11 DIAGNOSIS — J449 Chronic obstructive pulmonary disease, unspecified: Secondary | ICD-10-CM

## 2019-08-11 DIAGNOSIS — J4520 Mild intermittent asthma, uncomplicated: Secondary | ICD-10-CM | POA: Diagnosis not present

## 2019-08-11 NOTE — Progress Notes (Signed)
[facility] [place], [place] [address]  Pulmonary Sleep Medicine   Office Visit Note  Patient Name: Shari Gutierrez DOB: 07-Sep-1950 MRN [id number]  Date of Service: 08/11/2019  Complaints/HPI: Pt is here for pulmonary follow up on copd, asthma.  She had to stop her trelegy due to cost.  So she is back on Symbicort two puffs twice daily.  She reports using her rescue inhaler 1-2 times weekly.  She has good control at this time.   ROS  General: (-) fever, (-) chills, (-) night sweats, (-) weakness Skin: (-) rashes, (-) itching,. Eyes: (-) visual changes, (-) redness, (-) itching. Nose and Sinuses: (-) nasal stuffiness or itchiness, (-) postnasal drip, (-) nosebleeds, (-) sinus trouble. Mouth and Throat: (-) sore throat, (-) hoarseness. Neck: (-) swollen glands, (-) enlarged thyroid, (-) neck pain. Respiratory: - cough, (-) bloody sputum, - shortness of breath, - wheezing. Cardiovascular: - ankle swelling, (-) chest pain. Lymphatic: (-) lymph node enlargement. Neurologic: (-) numbness, (-) tingling. Psychiatric: (-) anxiety, (-) depression   Current Medication: Outpatient Encounter Medications as of 08/11/2019  Medication Sig  . albuterol (VENTOLIN HFA) 108 (90 Base) MCG/ACT inhaler Inhale 1-2 puffs into the lungs every 6 (six) hours as needed for wheezing or shortness of breath.  . ALPRAZolam (XANAX) 0.25 MG tablet Take 1 tablet (0.25 mg total) by mouth at bedtime as needed for anxiety.  . budesonide-formoterol (SYMBICORT) 160-4.5 MCG/ACT inhaler Inhale 2 puffs into the lungs 2 (two) times daily.  . cholecalciferol (VITAMIN D) 1000 UNITS tablet Take 1,000 Units by mouth daily.   Marland Kitchen conjugated estrogens (PREMARIN) vaginal cream Place 3.22 Applicatorfuls vaginally 2 (two) times a week.  . fenofibrate 160 MG tablet Take 1 tablet (160 mg total) by mouth daily.  . Fluticasone-Umeclidin-Vilant (TRELEGY ELLIPTA) 100-62.5-25 MCG/INH AEPB Inhale 1 puff into the  lungs daily.  Marland Kitchen ibuprofen (ADVIL) 800 MG tablet Take 1 tablet (800 mg total) by mouth every 8 (eight) hours as needed.  . latanoprost (XALATAN) 0.005 % ophthalmic solution Place 1 drop into both eyes at bedtime.   . meclizine (ANTIVERT) 25 MG tablet TAKE 1 TABLET (25 MG TOTAL) BY MOUTH 2 (TWO) TIMES DAILY AS NEEDED FOR DIZZINESS.  . metoprolol tartrate (LOPRESSOR) 25 MG tablet Take 1 tablet (25 mg total) by mouth 2 (two) times daily.  . Multiple Vitamin (MULTIVITAMIN WITH MINERALS) TABS tablet Take 1 tablet by mouth 3 (three) times a week.  . nitrofurantoin, macrocrystal-monohydrate, (MACROBID) 100 MG capsule Take 1 capsule (100 mg total) by mouth 2 (two) times daily.  . Tiotropium Bromide Monohydrate (SPIRIVA RESPIMAT) 2.5 MCG/ACT AERS Inhale 1 Inhaler into the lungs daily.  . valACYclovir (VALTREX) 1000 MG tablet Take 1 tablet (1,000 mg total) by mouth 2 (two) times daily.   No facility-administered encounter medications on file as of 08/11/2019.    Surgical History: Past Surgical History:  Procedure Laterality Date  . APPENDECTOMY  1989  . CHOLECYSTECTOMY  02/26/2019  . COLONOSCOPY  2016  . OOPHORECTOMY  1989    Medical History: Past Medical History:  Diagnosis Date  . Anxiety   . COPD (chronic obstructive pulmonary disease) ([place])   . 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 Gall stones   . Hematuria   . Hyperlipidemia   . Hypertension   . Reflux   . Sinusitis   . Vitamin D deficiency     Family History: Family History  Problem Relation Age of Onset  . Kidney failure Mother   .  Cancer Father        Gastric  . Breast cancer Neg Hx   . Ovarian cancer Neg Hx   . Colon cancer Neg Hx   . Heart disease Neg Hx   . Diabetes Neg Hx     Social History: Social History   Socioeconomic History  . Marital status: Married    Spouse name: Not on file  . Number of children: Not on file  . Years of education: Not on file  . Highest education level: Not on file  Occupational History  . Not  on file  Tobacco Use  . Smoking status: Current Every Day Smoker    Packs/day: 1.00    Years: 30.00    Pack years: 30.00    Types: Cigarettes    Start date: 07/28/1979  . Smokeless tobacco: Never Used  . Tobacco comment: pt is using a patch to help quit  Vaping Use  . Vaping Use: Never used  Substance and Sexual Activity  . Alcohol use: Yes    Alcohol/week: 0.0 standard drinks    Comment: rarely  . Drug use: No  . Sexual activity: Yes    Birth control/protection: Post-menopausal  Other Topics Concern  . Not on file  Social History Narrative  . Not on file   Social Determinants of Health   Financial Resource Strain:   . Difficulty of Paying Living Expenses:   Food Insecurity:   . Worried About Charity fundraiser in the Last Year:   . Arboriculturist in the Last Year:   Transportation Needs:   . Film/video editor (Medical):   Marland Kitchen Lack of Transportation (Non-Medical):   Physical Activity:   . Days of Exercise per Week:   . Minutes of Exercise per Session:   Stress:   . Feeling of Stress :   Social Connections:   . Frequency of Communication with Friends and Family:   . Frequency of Social Gatherings with Friends and Family:   . Attends Religious Services:   . Active Member of Clubs or Organizations:   . Attends Archivist Meetings:   Marland Kitchen Marital Status:   Intimate Partner Violence:   . Fear of Current or Ex-Partner:   . Emotionally Abused:   Marland Kitchen Physically Abused:   . Sexually Abused:     Vital Signs: Blood pressure (!) 141/69, pulse 84, temperature 97.6 F (36.4 C), resp. rate 16, height 5\' 5"  (1.651 m), weight 121 lb 9.6 oz (55.2 kg), SpO2 96 %.  Examination: General Appearance: The patient is well-developed, well-nourished, and in no distress. Skin: Gross inspection of skin unremarkable. Head: normocephalic, no gross deformities. Eyes: no gross deformities noted. ENT: ears appear grossly normal no exudates. Neck: Supple. No thyromegaly. No  LAD. Respiratory: clear, but diminished breath sounds. . Cardiovascular: Normal S1 and S2 without murmur or rub. Extremities: No cyanosis. pulses are equal. Neurologic: Alert and oriented. No involuntary movements.  LABS: No results found for this or any previous visit (from the past 2160 hour(s)).  Radiology: MM DIAG BREAST TOMO UNI LEFT  Result Date: 05/29/2019 CLINICAL DATA:  69 year old patient recalled from recent screening mammogram for evaluation of one view asymmetry in the superior left breast on the MLO projection. EXAM: DIGITAL DIAGNOSTIC UNILATERAL LEFT MAMMOGRAM WITH CAD AND TOMO COMPARISON:  May 19, 2019 and earlier priors ACR Breast Density Category c: The breast tissue is heterogeneously dense, which may obscure small masses. FINDINGS: Spot compression view of the left breast  with tomography shows no persistent asymmetry. Normal fibroglandular tissue is seen. 90 degree lateral view of the left breast with tomography is negative. Mammographic images were processed with CAD. IMPRESSION: No evidence of malignancy in the left breast. RECOMMENDATION: Screening mammogram in one year.(Code:SM-B-01Y) I have discussed the findings and recommendations with the patient. If applicable, a reminder letter will be sent to the patient regarding the next appointment. BI-RADS CATEGORY  1: Negative. Electronically Signed   By: Curlene Dolphin M.D.   On: 05/29/2019 11:55    No results found.  No results found.    Assessment and Plan: Patient Active Problem List   Diagnosis Date Noted  . Status post laparoscopic cholecystectomy 03/06/2019  . Encounter for long-term (current) use of medications 08/28/2018  . Vitamin D deficiency 08/28/2018  . Dysuria 08/28/2018  . Acute midline low back pain without sciatica 04/24/2018  . Acute recurrent sinusitis 02/11/2018  . Chronic obstructive pulmonary disease ([place]) 02/11/2018  . Nicotine dependence, cigarettes, uncomplicated 88/28/0034  . Cough  10/15/2017  . Abnormal renal function 10/15/2017  . Vertigo 09/23/2017  . Generalized anxiety disorder 09/23/2017  . Hyperlipidemia 07/23/2017  . Mild intermittent asthma without complication 91/79/1505  . Essential hypertension 04/23/2017  . Acute non-recurrent maxillary sinusitis 04/23/2017  . Menopause 07/26/2016  . Vaginal atrophy 07/26/2016  . Female dyspareunia 07/26/2016  . Microscopic hematuria 07/28/2014    1. Chronic obstructive pulmonary disease, unspecified COPD type ([place]) Good control of symptoms with Symbicort currently, continue present management.  Follow up as discussed.   2. Mild intermittent asthma, unspecified whether complicated Stable, continue to use Symbicort and rescue inhaler as prescribed.   3. Nicotine dependence, cigarettes, uncomplicated Smoking cessation counseling: 1. Pt acknowledges the risks of long term smoking, she will try to quite smoking. 2. Options for different medications including nicotine products, chewing gum, patch etc, Wellbutrin and Chantix is discussed 3. Goal and date of compete cessation is discussed 4. Total time spent in smoking cessation is 15 min.   General Counseling: I have discussed the findings of the evaluation and examination with Zaira.  I have also discussed any further diagnostic evaluation thatmay be needed or ordered today. Emlyn verbalizes understanding of the findings of todays visit. We also reviewed her medications today and discussed drug interactions and side effects including but not limited excessive drowsiness and altered mental states. We also discussed that there is always a risk not just to her but also people around her. she has been encouraged to call the office with any questions or concerns that should arise related to todays visit.  No orders of the defined types were placed in this encounter.    Time spent: 30 This patient was seen by Orson Gear AGNP-C in Collaboration with Dr. Devona Konig as  a part of collaborative care agreement.   I have personally obtained a history, examined the patient, evaluated laboratory and imaging results, formulated the assessment and plan and placed orders.    Allyne Gee, MD [facility] Pulmonary and Critical Care Sleep medicine

## 2019-08-21 ENCOUNTER — Telehealth: Payer: Self-pay

## 2019-08-21 NOTE — Telephone Encounter (Signed)
Lmom to confirm and screen for 08-25-19 ov.

## 2019-08-25 ENCOUNTER — Ambulatory Visit: Payer: PPO | Admitting: Nurse Practitioner

## 2019-08-25 ENCOUNTER — Telehealth: Payer: Self-pay

## 2019-08-25 NOTE — Telephone Encounter (Signed)
Patient rescheduled appointment on 08/25/2019 to 09/09/2019. klh 

## 2019-09-05 ENCOUNTER — Telehealth: Payer: Self-pay

## 2019-09-05 NOTE — Telephone Encounter (Signed)
Confirmed and screened for 09-09-19 ov. 

## 2019-09-09 ENCOUNTER — Other Ambulatory Visit: Payer: Self-pay

## 2019-09-09 ENCOUNTER — Ambulatory Visit (INDEPENDENT_AMBULATORY_CARE_PROVIDER_SITE_OTHER): Payer: PPO | Admitting: Nurse Practitioner

## 2019-09-09 VITALS — BP 128/66 | HR 82 | Temp 97.6°F | Resp 16 | Ht 65.0 in | Wt 120.0 lb

## 2019-09-09 DIAGNOSIS — J449 Chronic obstructive pulmonary disease, unspecified: Secondary | ICD-10-CM

## 2019-09-09 DIAGNOSIS — I10 Essential (primary) hypertension: Secondary | ICD-10-CM | POA: Diagnosis not present

## 2019-09-09 DIAGNOSIS — Z0001 Encounter for general adult medical examination with abnormal findings: Secondary | ICD-10-CM

## 2019-09-09 DIAGNOSIS — E782 Mixed hyperlipidemia: Secondary | ICD-10-CM | POA: Diagnosis not present

## 2019-09-09 DIAGNOSIS — R300 Dysuria: Secondary | ICD-10-CM | POA: Diagnosis not present

## 2019-09-09 MED ORDER — SPIRIVA RESPIMAT 2.5 MCG/ACT IN AERS: 2.0000 | INHALATION_SPRAY | Freq: Every day | RESPIRATORY_TRACT | 5 refills | Status: AC

## 2019-09-09 NOTE — Progress Notes (Signed)
[facility] [place], [place] [address]  Internal MEDICINE  Office Visit Note  Patient Name: Shari Gutierrez  [id number]  [id number]  Date of Service: 09/24/2019   Pt is here for routine health maintenance examination  Chief Complaint  Patient presents with  . Medicare Wellness  . Hyperlipidemia  . Hypertension     Patient is here for health maintenance exam. She states that she is doing well, overall. She states that she has stopped taking trelegy due to expense. She is now taking symbicort twice daily and using spiriva daily. She states with this combination, she rarely needs to use rescue inhaler. When she tried trelegy, she states that she had to use her rescue inhaler a few times per week.  Her blood pressure is well managed.  She has had both Pfizer COVID 19 vaccines and these are documented in her immunization record.  Her most recent mammogram required diagnostic images but was negative . Most recent labs were done 01/2019. Her LDL was mildly elevated and her LDL/HDL ratio was 5.5. she is taking fenofibrate.      Current Medication: Outpatient Encounter Medications as of 09/09/2019  Medication Sig  . albuterol (VENTOLIN HFA) 108 (90 Base) MCG/ACT inhaler Inhale 1-2 puffs into the lungs every 6 (six) hours as needed for wheezing or shortness of breath.  . ALPRAZolam (XANAX) 0.25 MG tablet Take 1 tablet (0.25 mg total) by mouth at bedtime as needed for anxiety.  . budesonide-formoterol (SYMBICORT) 160-4.5 MCG/ACT inhaler Inhale 2 puffs into the lungs 2 (two) times daily.  . cholecalciferol (VITAMIN D) 1000 UNITS tablet Take 1,000 Units by mouth daily.   Marland Kitchen conjugated estrogens (PREMARIN) vaginal cream Place 9.47 Applicatorfuls vaginally 2 (two) times a week.  . fenofibrate 160 MG tablet Take 1 tablet (160 mg total) by mouth daily.  Marland Kitchen ibuprofen (ADVIL) 800 MG tablet Take 1 tablet (800 mg total) by mouth every 8 (eight) hours as needed.  .  latanoprost (XALATAN) 0.005 % ophthalmic solution Place 1 drop into both eyes at bedtime.   . meclizine (ANTIVERT) 25 MG tablet TAKE 1 TABLET (25 MG TOTAL) BY MOUTH 2 (TWO) TIMES DAILY AS NEEDED FOR DIZZINESS.  . metoprolol tartrate (LOPRESSOR) 25 MG tablet Take 1 tablet (25 mg total) by mouth 2 (two) times daily.  . Multiple Vitamin (MULTIVITAMIN WITH MINERALS) TABS tablet Take 1 tablet by mouth 3 (three) times a week.  . nitrofurantoin, macrocrystal-monohydrate, (MACROBID) 100 MG capsule Take 1 capsule (100 mg total) by mouth 2 (two) times daily.  . valACYclovir (VALTREX) 1000 MG tablet Take 1 tablet (1,000 mg total) by mouth 2 (two) times daily.  . Tiotropium Bromide Monohydrate (SPIRIVA RESPIMAT) 2.5 MCG/ACT AERS Inhale 2 puffs into the lungs daily.   No facility-administered encounter medications on file as of 09/09/2019.    Surgical History: Past Surgical History:  Procedure Laterality Date  . APPENDECTOMY  1989  . CHOLECYSTECTOMY  02/26/2019  . COLONOSCOPY  2016  . OOPHORECTOMY  1989    Medical History: Past Medical History:  Diagnosis Date  . Anxiety   . COPD (chronic obstructive pulmonary disease) ([place])   . 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 Gall stones   . Hematuria   . Hyperlipidemia   . Hypertension   . Reflux   . Sinusitis   . Vitamin D deficiency     Family History: Family History  Problem Relation Age of Onset  . Kidney failure Mother   . Cancer Father  Gastric  . Breast cancer Neg Hx   . Ovarian cancer Neg Hx   . Colon cancer Neg Hx   . Heart disease Neg Hx   . Diabetes Neg Hx       Review of Systems  Constitutional: Negative for activity change, chills, fatigue and unexpected weight change.  HENT: Positive for congestion and postnasal drip. Negative for rhinorrhea, sneezing and sore throat.   Respiratory: Negative for cough, chest tightness, shortness of breath and wheezing.        States that she did poorly with trial of Trelegy.   Cardiovascular:  Negative for chest pain and palpitations.  Gastrointestinal: Negative for abdominal pain, constipation, diarrhea, nausea and vomiting.  Endocrine: Negative for cold intolerance, heat intolerance, polydipsia and polyuria.  Genitourinary: Negative for dysuria, frequency and urgency.  Musculoskeletal: Negative for arthralgias, back pain, joint swelling and neck pain.  Skin: Negative for rash.  Allergic/Immunologic: Positive for environmental allergies.  Neurological: Negative for dizziness, tremors, numbness and headaches.  Hematological: Negative for adenopathy. Does not bruise/bleed easily.  Psychiatric/Behavioral: Negative for behavioral problems (Depression), sleep disturbance and suicidal ideas. The patient is nervous/anxious.      Today's Vitals   09/09/19 1052  BP: 128/66  Pulse: 82  Resp: 16  Temp: 97.6 F (36.4 C)  SpO2: 97%  Weight: 120 lb (54.4 kg)  Height: 5\' 5"  (1.651 m)   Body mass index is 19.97 kg/m.  Physical Exam Vitals and nursing note reviewed.  Constitutional:      General: She is not in acute distress.    Appearance: Normal appearance. She is well-developed. She is not diaphoretic.  HENT:     Head: Normocephalic and atraumatic.     Nose: Nose normal.     Mouth/Throat:     Pharynx: No oropharyngeal exudate.  Eyes:     Pupils: Pupils are equal, round, and reactive to light.  Neck:     Thyroid: No thyromegaly.     Vascular: No JVD.     Trachea: No tracheal deviation.  Cardiovascular:     Rate and Rhythm: Normal rate and regular rhythm.     Pulses: Normal pulses.     Heart sounds: Normal heart sounds. No murmur heard.  No friction rub. No gallop.   Pulmonary:     Effort: Pulmonary effort is normal. No respiratory distress.     Breath sounds: Wheezing present. No rales.     Comments: Mild, expiratory wheezes in bilateral lung fields.  Chest:     Chest wall: No tenderness.  Abdominal:     General: Bowel sounds are normal.     Palpations: Abdomen is  soft.     Tenderness: There is no abdominal tenderness.  Musculoskeletal:        General: Normal range of motion.     Cervical back: Normal range of motion and neck supple.  Lymphadenopathy:     Cervical: No cervical adenopathy.  Skin:    General: Skin is warm and dry.  Neurological:     General: No focal deficit present.     Mental Status: She is alert and oriented to person, place, and time.     Cranial Nerves: No cranial nerve deficit.  Psychiatric:        Mood and Affect: Mood normal.        Behavior: Behavior normal.        Thought Content: Thought content normal.        Judgment: Judgment normal.    Depression  screen [facility] 2/9 09/09/2019 12/24/2018 08/22/2018 07/01/2018 04/24/2018  Decreased Interest 0 0 0 0 0  Down, Depressed, Hopeless 0 0 0 0 0  PHQ - 2 Score 0 0 0 0 0    Functional Status Survey: Is the patient deaf or have difficulty hearing?: No Does the patient have difficulty seeing, even when wearing glasses/contacts?: No Does the patient have difficulty concentrating, remembering, or making decisions?: No Does the patient have difficulty walking or climbing stairs?: No Does the patient have difficulty dressing or bathing?: No Does the patient have difficulty doing errands alone such as visiting a doctor's office or shopping?: No  MMSE - Mini Mental State Exam 09/09/2019 08/22/2018  Orientation to time 5 5  Orientation to Place 5 5  Registration 3 3  Attention/ Calculation 5 5  Recall 3 3  Language- name 2 objects 2 2  Language- repeat 1 1  Language- follow 3 step command 3 3  Language- read & follow direction 1 1  Write a sentence 1 1  Copy design 1 1  Total score 30 30    Fall Risk  09/09/2019 02/13/2019 12/24/2018 08/22/2018 07/01/2018  Falls in the past year? 0 0 0 0 0  Number falls in past yr: - - 0 - -  Injury with Fall? - - 0 - -      LABS: Recent Results (from the past 2160 hour(s))  UA/M w/rflx Culture, Routine     Status: Abnormal   Collection  Time: 09/09/19 12:23 PM   Specimen: Urine   Urine  Result Value Ref Range   Specific Gravity, UA 1.009 1.005 - 1.030   pH, UA 8.5 (H) 5.0 - 7.5   Color, UA Yellow Yellow   Appearance Ur Clear Clear   Leukocytes,UA Trace (A) Negative   Protein,UA Negative Negative/Trace   Glucose, UA Negative Negative   Ketones, UA Negative Negative   RBC, UA Negative Negative   Bilirubin, UA Negative Negative   Urobilinogen, Ur 0.2 0.2 - 1.0 mg/dL   Nitrite, UA Negative Negative   Microscopic Examination See below:     Comment: Microscopic was indicated and was performed.   Urinalysis Reflex Comment     Comment: This specimen has reflexed to a Urine Culture.  Microscopic Examination     Status: None   Collection Time: 09/09/19 12:23 PM   Urine  Result Value Ref Range   WBC, UA 0-5 0 - 5 /hpf   RBC None seen 0 - 2 /hpf   Epithelial Cells (non renal) 0-10 0 - 10 /hpf   Casts None seen None seen /lpf   Bacteria, UA None seen None seen/Few  Urine Culture, Reflex     Status: Abnormal   Collection Time: 09/09/19 12:23 PM   Urine  Result Value Ref Range   Urine Culture, Routine Final report (A)    Organism ID, Bacteria Enterococcus faecalis (A)     Comment: Greater than 100,000 colony forming units per mL   ORGANISM ID, BACTERIA Comment     Comment: Mixed urogenital flora 50,000-100,000 colony forming units per mL    Antimicrobial Susceptibility Comment     Comment:       ** S = Susceptible; I = Intermediate; R = Resistant **                    P = Positive; N = Negative             MICS are expressed in  micrograms per mL    Antibiotic                 RSLT#1    RSLT#2    RSLT#3    RSLT#4 Ciprofloxacin                  S Levofloxacin                   S Nitrofurantoin                 S Penicillin                     S Tetracycline                   R Vancomycin                     S     Assessment/Plan: 1. Encounter for general adult medical examination with abnormal findings Annual  health maintenance exam today  2. Chronic obstructive pulmonary disease, unspecified COPD type ([place]) Patient has restarted symmbicort twice daily. Will add back spiriva respimat daily. New prescription sent to her pharmacy  - Tiotropium Bromide Monohydrate (SPIRIVA RESPIMAT) 2.5 MCG/ACT AERS; Inhale 2 puffs into the lungs daily.  Dispense: 4 g; Refill: 5  3. Essential hypertension Stable. Continue bp medication as prescribed   4. Mixed hyperlipidemia Most recent lipid panel looks good. Continue fenofibrate as prescribed   5. Dysuria - UA/M w/rflx Culture, Routine  General Counseling: Agapita verbalizes understanding of the findings of todays visit and agrees with plan of treatment. I have discussed any further diagnostic evaluation that may be needed or ordered today. We also reviewed her medications today. she has been encouraged to call the office with any questions or concerns that should arise related to todays visit.    Counseling:  This patient was seen by Leretha Pol FNP Collaboration with Dr Lavera Guise as a part of collaborative care agreement  Orders Placed This Encounter  Procedures  . Microscopic Examination  . Urine Culture, Reflex  . UA/M w/rflx Culture, Routine    Meds ordered this encounter  Medications  . Tiotropium Bromide Monohydrate (SPIRIVA RESPIMAT) 2.5 MCG/ACT AERS    Sig: Inhale 2 puffs into the lungs daily.    Dispense:  4 g    Refill:  5    Order Specific Question:   Supervising Provider    Answer:   Lavera Guise [[id number]]    Total time spent: 10 Minutes  Time spent includes review of chart, medications, test results, and follow up plan with the patient.     Lavera Guise, MD  Internal Medicine

## 2019-09-10 NOTE — Progress Notes (Signed)
reviewed

## 2019-09-15 LAB — MICROSCOPIC EXAMINATION
Bacteria, UA: NONE SEEN
Casts: NONE SEEN /lpf
RBC, Urine: NONE SEEN /hpf (ref 0–2)

## 2019-09-15 LAB — UA/M W/RFLX CULTURE, ROUTINE
Bilirubin, UA: NEGATIVE
Glucose, UA: NEGATIVE
Ketones, UA: NEGATIVE
Nitrite, UA: NEGATIVE
Protein,UA: NEGATIVE
RBC, UA: NEGATIVE
Specific Gravity, UA: 1.009 (ref 1.005–1.030)
Urobilinogen, Ur: 0.2 mg/dL (ref 0.2–1.0)
pH, UA: 8.5 — ABNORMAL HIGH (ref 5.0–7.5)

## 2019-09-15 LAB — URINE CULTURE, REFLEX

## 2019-09-24 DIAGNOSIS — Z0001 Encounter for general adult medical examination with abnormal findings: Secondary | ICD-10-CM | POA: Insufficient documentation

## 2019-09-26 ENCOUNTER — Other Ambulatory Visit: Payer: Self-pay | Admitting: Adult Health

## 2019-10-07 ENCOUNTER — Other Ambulatory Visit: Payer: Self-pay

## 2019-10-07 DIAGNOSIS — J4520 Mild intermittent asthma, uncomplicated: Secondary | ICD-10-CM

## 2019-10-07 MED ORDER — BUDESONIDE-FORMOTEROL FUMARATE 160-4.5 MCG/ACT IN AERO: INHALATION_SPRAY | RESPIRATORY_TRACT | 3 refills | Status: DC

## 2019-10-20 ENCOUNTER — Encounter: Payer: Self-pay | Admitting: Internal Medicine

## 2019-10-20 ENCOUNTER — Other Ambulatory Visit: Payer: Self-pay

## 2019-10-20 ENCOUNTER — Ambulatory Visit (INDEPENDENT_AMBULATORY_CARE_PROVIDER_SITE_OTHER): Payer: PPO | Admitting: Internal Medicine

## 2019-10-20 DIAGNOSIS — R300 Dysuria: Secondary | ICD-10-CM

## 2019-10-20 DIAGNOSIS — N182 Chronic kidney disease, stage 2 (mild): Secondary | ICD-10-CM

## 2019-10-20 DIAGNOSIS — R21 Rash and other nonspecific skin eruption: Secondary | ICD-10-CM

## 2019-10-20 DIAGNOSIS — R109 Unspecified abdominal pain: Secondary | ICD-10-CM

## 2019-10-20 DIAGNOSIS — I10 Essential (primary) hypertension: Secondary | ICD-10-CM | POA: Diagnosis not present

## 2019-10-20 LAB — POCT URINALYSIS DIPSTICK
Bilirubin, UA: NEGATIVE
Glucose, UA: NEGATIVE
Ketones, UA: NEGATIVE
Leukocytes, UA: NEGATIVE
Nitrite, UA: NEGATIVE
Protein, UA: NEGATIVE
Spec Grav, UA: 1.01 (ref 1.010–1.025)
Urobilinogen, UA: 0.2 E.U./dL
pH, UA: 6.5 (ref 5.0–8.0)

## 2019-10-20 MED ORDER — TRAMADOL HCL 50 MG PO TABS: 50.0000 mg | ORAL_TABLET | Freq: Three times a day (TID) | ORAL | 0 refills | Status: DC | PRN

## 2019-10-20 MED ORDER — FAMCICLOVIR 500 MG PO TABS: 500.0000 mg | ORAL_TABLET | Freq: Two times a day (BID) | ORAL | 0 refills | Status: DC

## 2019-10-20 NOTE — Progress Notes (Signed)
[facility] [place], [place] [address]  Internal MEDICINE  Office Visit Note  Patient Name: Shari Gutierrez  [id number]  [id number]  Date of Service: 10/21/2019  Chief Complaint  Patient presents with  . Acute Visit    possible uti, meds not helping, lower back pain, right side pain, rash in that area started last night, frequency but has also increased fluid intake, advil every 4 hours  . Hypertension  . Hyperlipidemia  . Quality Metric Gaps    TDAP     HPI  Pt is here with c/o right flank pain with radiation to right groin, pain is spasmodic in nature, she also noticed a rash this am as well, denies any burning on urination, no fever or chills. She does have h/o elevation in her cr. No renal u/s in the past. BP is slightly elevated today  Current Medication: Outpatient Encounter Medications as of 10/20/2019  Medication Sig  . albuterol (VENTOLIN HFA) 108 (90 Base) MCG/ACT inhaler Inhale 1-2 puffs into the lungs every 6 (six) hours as needed for wheezing or shortness of breath.  . ALPRAZolam (XANAX) 0.25 MG tablet Take 1 tablet (0.25 mg total) by mouth at bedtime as needed for anxiety.  . budesonide-formoterol (SYMBICORT) 160-4.5 MCG/ACT inhaler Take  2 puffs by po twice  A day  . cholecalciferol (VITAMIN D) 1000 UNITS tablet Take 1,000 Units by mouth daily.   Marland Kitchen conjugated estrogens (PREMARIN) vaginal cream Place 2.67 Applicatorfuls vaginally 2 (two) times a week.  . fenofibrate 160 MG tablet Take 1 tablet (160 mg total) by mouth daily.  Marland Kitchen ibuprofen (ADVIL) 800 MG tablet Take 1 tablet (800 mg total) by mouth every 8 (eight) hours as needed.  . latanoprost (XALATAN) 0.005 % ophthalmic solution Place 1 drop into both eyes at bedtime.   . meclizine (ANTIVERT) 25 MG tablet TAKE 1 TABLET (25 MG TOTAL) BY MOUTH 2 (TWO) TIMES DAILY AS NEEDED FOR DIZZINESS.  . metoprolol tartrate (LOPRESSOR) 25 MG tablet TAKE 1 TABLET BY MOUTH TWICE A DAY  . Multiple Vitamin  (MULTIVITAMIN WITH MINERALS) TABS tablet Take 1 tablet by mouth 3 (three) times a week.  . nitrofurantoin, macrocrystal-monohydrate, (MACROBID) 100 MG capsule Take 1 capsule (100 mg total) by mouth 2 (two) times daily.  . Tiotropium Bromide Monohydrate (SPIRIVA RESPIMAT) 2.5 MCG/ACT AERS Inhale 2 puffs into the lungs daily.  . famciclovir (FAMVIR) 500 MG tablet Take 1 tablet (500 mg total) by mouth 2 (two) times daily.  . traMADol (ULTRAM) 50 MG tablet Take 1 tablet (50 mg total) by mouth every 8 (eight) hours as needed for up to 5 days.  . valACYclovir (VALTREX) 1000 MG tablet Take 1 tablet (1,000 mg total) by mouth 2 (two) times daily. (Patient not taking: Reported on 10/20/2019)   No facility-administered encounter medications on file as of 10/20/2019.    Surgical History: Past Surgical History:  Procedure Laterality Date  . APPENDECTOMY  1989  . CHOLECYSTECTOMY  02/26/2019  . COLONOSCOPY  2016  . OOPHORECTOMY  1989    Medical History: Past Medical History:  Diagnosis Date  . Anxiety   . COPD (chronic obstructive pulmonary disease) ([place])   . 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 Gall stones   . Hematuria   . Hyperlipidemia   . Hypertension   . Reflux   . Sinusitis   . Vitamin D deficiency     Family History: Family History  Problem Relation Age of Onset  . Kidney failure Mother   .  Cancer Father        Gastric  . Breast cancer Neg Hx   . Ovarian cancer Neg Hx   . Colon cancer Neg Hx   . Heart disease Neg Hx   . Diabetes Neg Hx     Social History   Socioeconomic History  . Marital status: Married    Spouse name: Not on file  . Number of children: Not on file  . Years of education: Not on file  . Highest education level: Not on file  Occupational History  . Not on file  Tobacco Use  . Smoking status: Current Every Day Smoker    Packs/day: 1.00    Years: 30.00    Pack years: 30.00    Types: Cigarettes    Start date: 07/28/1979  . Smokeless tobacco: Never Used  . Tobacco  comment: pt is using a patch to help quit  Vaping Use  . Vaping Use: Never used  Substance and Sexual Activity  . Alcohol use: Yes    Alcohol/week: 0.0 standard drinks    Comment: rarely  . Drug use: No  . Sexual activity: Yes    Birth control/protection: Post-menopausal  Other Topics Concern  . Not on file  Social History Narrative  . Not on file   Social Determinants of Health   Financial Resource Strain:   . Difficulty of Paying Living Expenses: Not on file  Food Insecurity:   . Worried About Charity fundraiser in the Last Year: Not on file  . Ran Out of Food in the Last Year: Not on file  Transportation Needs:   . Lack of Transportation (Medical): Not on file  . Lack of Transportation (Non-Medical): Not on file  Physical Activity:   . Days of Exercise per Week: Not on file  . Minutes of Exercise per Session: Not on file  Stress:   . Feeling of Stress : Not on file  Social Connections:   . Frequency of Communication with Friends and Family: Not on file  . Frequency of Social Gatherings with Friends and Family: Not on file  . Attends Religious Services: Not on file  . Active Member of Clubs or Organizations: Not on file  . Attends Archivist Meetings: Not on file  . Marital Status: Not on file  Intimate Partner Violence:   . Fear of Current or Ex-Partner: Not on file  . Emotionally Abused: Not on file  . Physically Abused: Not on file  . Sexually Abused: Not on file      Review of Systems  Constitutional: Positive for fatigue. Negative for fever.  HENT: Negative.   Respiratory: Negative.   Cardiovascular: Negative.   Gastrointestinal: Negative.   Genitourinary: Positive for flank pain. Negative for difficulty urinating and genital sores.  Musculoskeletal: Positive for back pain.  Skin: Positive for rash.  Hematological: Negative.   Psychiatric/Behavioral: Negative.     Vital Signs: BP (!) 140/98   Pulse 87   Temp (!) 97.5 F (36.4 C)   Resp  16   Ht 5\' 5"  (1.651 m)   Wt 122 lb (55.3 kg)   SpO2 96%   BMI 20.30 kg/m    Physical Exam Constitutional:      General: She is not in acute distress.    Appearance: She is well-developed. She is not diaphoretic.  HENT:     Head: Normocephalic and atraumatic.     Mouth/Throat:     Pharynx: No oropharyngeal exudate.  Eyes:  Pupils: Pupils are equal, round, and reactive to light.  Neck:     Thyroid: No thyromegaly.     Vascular: No JVD.     Trachea: No tracheal deviation.  Cardiovascular:     Rate and Rhythm: Normal rate and regular rhythm.     Heart sounds: Normal heart sounds. No murmur heard.  No friction rub. No gallop.   Pulmonary:     Effort: Pulmonary effort is normal. No respiratory distress.     Breath sounds: No wheezing or rales.  Chest:     Chest wall: No tenderness.  Abdominal:     General: Bowel sounds are normal.     Palpations: Abdomen is soft.     Tenderness: There is abdominal tenderness. There is right CVA tenderness.  Musculoskeletal:        General: Normal range of motion.     Cervical back: Normal range of motion and neck supple.  Lymphadenopathy:     Cervical: No cervical adenopathy.  Skin:    General: Skin is warm and dry.     Findings: Rash present.     Comments: No blisters seen   Neurological:     Mental Status: She is alert and oriented to person, place, and time.     Cranial Nerves: No cranial nerve deficit.  Psychiatric:        Behavior: Behavior normal.        Thought Content: Thought content normal.        Judgment: Judgment normal.    Assessment/Plan: 1. Right flank pain Pt is in moderate pain, unclear etiology at this time, will start pain control   - traMADol (ULTRAM) 50 MG tablet; Take 1 tablet (50 mg total) by mouth every 8 (eight) hours as needed for up to 5 days.  Dispense: 15 tablet; Refill: 0  2. Rash and nonspecific skin eruption A mild eruption on right flank, no blister seen but seems to be in a pattern of group,  only erythema present, pt is instructed to watch for now  - famciclovir (FAMVIR) 500 MG tablet; Take 1 tablet (500 mg total) by mouth 2 (two) times daily.  Dispense: 20 tablet; Refill: 0  3. Chronic kidney disease, stage II (mild) Elevated cr, will get renal u/s  - US Renal; Future  4. Benign essential hypertension Uncontrolled bp, however pt is in pain, will monitor for now   5. Dysuria - POCT Urinalysis Dipstick - CULTURE, URINE COMPREHENSIVE  General Counseling: Garland verbalizes understanding of the findings of todays visit and agrees with plan of treatment. I have discussed any further diagnostic evaluation that may be needed or ordered today. We also reviewed her medications today. she has been encouraged to call the office with any questions or concerns that should arise related to todays visit.  Orders Placed This Encounter  Procedures  . CULTURE, URINE COMPREHENSIVE  . US Renal  . POCT Urinalysis Dipstick    Meds ordered this encounter  Medications  . traMADol (ULTRAM) 50 MG tablet    Sig: Take 1 tablet (50 mg total) by mouth every 8 (eight) hours as needed for up to 5 days.    Dispense:  15 tablet    Refill:  0  . famciclovir (FAMVIR) 500 MG tablet    Sig: Take 1 tablet (500 mg total) by mouth 2 (two) times daily.    Dispense:  20 tablet    Refill:  0    Total time spent: 35 Minutes Time spent includes  review of chart, medications, test results, and follow up plan with the patient.      Dr Lavera Guise Internal medicine

## 2019-10-21 ENCOUNTER — Telehealth: Payer: Self-pay

## 2019-10-23 ENCOUNTER — Other Ambulatory Visit: Payer: Self-pay | Admitting: Internal Medicine

## 2019-10-23 DIAGNOSIS — R109 Unspecified abdominal pain: Secondary | ICD-10-CM

## 2019-10-23 MED ORDER — TRAMADOL HCL 50 MG PO TABS: 50.0000 mg | ORAL_TABLET | Freq: Three times a day (TID) | ORAL | 0 refills | Status: AC | PRN

## 2019-10-23 MED ORDER — PREGABALIN 50 MG PO CAPS: 50.0000 mg | ORAL_CAPSULE | Freq: Two times a day (BID) | ORAL | 0 refills | Status: DC

## 2019-10-23 NOTE — Telephone Encounter (Signed)
Pt said that she had nine pills left for tramadol she like to known that you think she need more med

## 2019-10-23 NOTE — Telephone Encounter (Signed)
Pt advised we send lyrica  and  also finished antibiotic

## 2019-10-23 NOTE — Telephone Encounter (Signed)
Lyrica sent , please notify pt

## 2019-10-23 NOTE — Telephone Encounter (Signed)
lmom that we send tramadol pres

## 2019-10-24 LAB — CULTURE, URINE COMPREHENSIVE

## 2019-10-28 ENCOUNTER — Encounter: Payer: PPO | Admitting: Obstetrics and Gynecology

## 2019-10-29 ENCOUNTER — Other Ambulatory Visit: Payer: Self-pay | Admitting: Nurse Practitioner

## 2019-10-29 ENCOUNTER — Telehealth: Payer: Self-pay

## 2019-10-29 DIAGNOSIS — R109 Unspecified abdominal pain: Secondary | ICD-10-CM

## 2019-10-29 MED ORDER — TRAMADOL HCL 50 MG PO TABS: 50.0000 mg | ORAL_TABLET | Freq: Three times a day (TID) | ORAL | 0 refills | Status: AC | PRN

## 2019-10-29 NOTE — Telephone Encounter (Signed)
Pt.notified

## 2019-10-29 NOTE — Telephone Encounter (Signed)
Sent new prescription for tramadol 50mg  up to TID as needed for pain. Sent #15 to her pharmacy.

## 2019-10-29 NOTE — Progress Notes (Signed)
Sent new prescription for tramadol 50mg  up to TID as needed for pain. Sent #15 to her pharmacy.

## 2019-11-04 ENCOUNTER — Telehealth: Payer: Self-pay

## 2019-11-04 NOTE — Telephone Encounter (Signed)
Pt advised she can stop lyrica

## 2019-11-05 ENCOUNTER — Other Ambulatory Visit: Payer: PPO

## 2019-11-12 ENCOUNTER — Ambulatory Visit: Payer: PPO | Admitting: Hospice and Palliative Medicine

## 2019-11-17 ENCOUNTER — Other Ambulatory Visit: Payer: Self-pay

## 2019-11-17 MED ORDER — FENOFIBRATE 160 MG PO TABS: 160.0000 mg | ORAL_TABLET | Freq: Every day | ORAL | 1 refills | Status: DC

## 2019-11-28 ENCOUNTER — Other Ambulatory Visit: Payer: Self-pay

## 2019-11-28 ENCOUNTER — Ambulatory Visit: Payer: PPO

## 2019-11-28 DIAGNOSIS — N182 Chronic kidney disease, stage 2 (mild): Secondary | ICD-10-CM

## 2019-12-03 ENCOUNTER — Ambulatory Visit: Payer: PPO | Admitting: Hospice and Palliative Medicine

## 2019-12-04 ENCOUNTER — Ambulatory Visit: Payer: PPO | Admitting: Internal Medicine

## 2019-12-09 ENCOUNTER — Other Ambulatory Visit: Payer: Self-pay

## 2019-12-09 ENCOUNTER — Ambulatory Visit (INDEPENDENT_AMBULATORY_CARE_PROVIDER_SITE_OTHER): Payer: PPO | Admitting: Internal Medicine

## 2019-12-09 ENCOUNTER — Encounter: Payer: Self-pay | Admitting: Internal Medicine

## 2019-12-09 DIAGNOSIS — N1831 Chronic kidney disease, stage 3a: Secondary | ICD-10-CM

## 2019-12-09 DIAGNOSIS — J449 Chronic obstructive pulmonary disease, unspecified: Secondary | ICD-10-CM | POA: Diagnosis not present

## 2019-12-09 DIAGNOSIS — I10 Essential (primary) hypertension: Secondary | ICD-10-CM | POA: Diagnosis not present

## 2019-12-09 DIAGNOSIS — F17200 Nicotine dependence, unspecified, uncomplicated: Secondary | ICD-10-CM | POA: Diagnosis not present

## 2019-12-09 NOTE — Addendum Note (Signed)
Addended by: Lavera Guise on: 12/09/2019 10:09 PM   Modules accepted: Orders

## 2019-12-09 NOTE — Progress Notes (Signed)
[facility] [place], [place] [address]  Internal MEDICINE  Office Visit Note  Patient Name: Shari Gutierrez  [id number]  [id number]  Date of Service: 12/09/2019  Chief Complaint  Patient presents with  . Follow-up    review [place], refill request  . Hypertension  . Hyperlipidemia  . COPD  . Anxiety  . policy update form    received    HPI Pt is here to discuss her lab work. Continues to have worsening in her renal functions. Renal u/s is normal, no evidence of cyst or cortical thinning  . Her blood pressure is slightly elevated today, does take lopressor  Recent herpes zoster, responded to antiviral and Lyrica continues to be a smoker, interested in low dose CT scan, has COPD  Denies any fever or chills  Current Medication: Outpatient Encounter Medications as of 12/09/2019  Medication Sig  . albuterol (VENTOLIN HFA) 108 (90 Base) MCG/ACT inhaler Inhale 1-2 puffs into the lungs every 6 (six) hours as needed for wheezing or shortness of breath.  . ALPRAZolam (XANAX) 0.25 MG tablet Take 1 tablet (0.25 mg total) by mouth at bedtime as needed for anxiety.  . budesonide-formoterol (SYMBICORT) 160-4.5 MCG/ACT inhaler Take  2 puffs by po twice  A day  . cholecalciferol (VITAMIN D) 1000 UNITS tablet Take 1,000 Units by mouth daily.   Marland Kitchen conjugated estrogens (PREMARIN) vaginal cream Place 2.95 Applicatorfuls vaginally 2 (two) times a week.  . fenofibrate 160 MG tablet Take 1 tablet (160 mg total) by mouth daily.  Marland Kitchen latanoprost (XALATAN) 0.005 % ophthalmic solution Place 1 drop into both eyes at bedtime.   . meclizine (ANTIVERT) 25 MG tablet TAKE 1 TABLET (25 MG TOTAL) BY MOUTH 2 (TWO) TIMES DAILY AS NEEDED FOR DIZZINESS.  . metoprolol tartrate (LOPRESSOR) 25 MG tablet TAKE 1 TABLET BY MOUTH TWICE A DAY  . Multiple Vitamin (MULTIVITAMIN WITH MINERALS) TABS tablet Take 1 tablet by mouth 3 (three) times a week.  . nitrofurantoin, macrocrystal-monohydrate, (MACROBID)  100 MG capsule Take 1 capsule (100 mg total) by mouth 2 (two) times daily.  . Tiotropium Bromide Monohydrate (SPIRIVA RESPIMAT) 2.5 MCG/ACT AERS Inhale 2 puffs into the lungs daily.  . traMADol (ULTRAM) 50 MG tablet Take 1 tablet (50 mg total) by mouth every 8 (eight) hours as needed.  . [DISCONTINUED] famciclovir (FAMVIR) 500 MG tablet Take 1 tablet (500 mg total) by mouth 2 (two) times daily.  . [DISCONTINUED] ibuprofen (ADVIL) 800 MG tablet Take 1 tablet (800 mg total) by mouth every 8 (eight) hours as needed.  . [DISCONTINUED] pregabalin (LYRICA) 50 MG capsule Take 1 capsule (50 mg total) by mouth 2 (two) times daily.   No facility-administered encounter medications on file as of 12/09/2019.    Surgical History: Past Surgical History:  Procedure Laterality Date  . APPENDECTOMY  1989  . CHOLECYSTECTOMY  02/26/2019  . COLONOSCOPY  2016  . OOPHORECTOMY  1989    Medical History: Past Medical History:  Diagnosis Date  . Anxiety   . COPD (chronic obstructive pulmonary disease) ([place])   . 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 Gall stones   . Hematuria   . Hyperlipidemia   . Hypertension   . Reflux   . Sinusitis   . Vitamin D deficiency     Family History: Family History  Problem Relation Age of Onset  . Kidney failure Mother   . Cancer Father        Gastric  . Breast cancer Neg  Hx   . Ovarian cancer Neg Hx   . Colon cancer Neg Hx   . Heart disease Neg Hx   . Diabetes Neg Hx     Social History   Socioeconomic History  . Marital status: Married    Spouse name: Not on file  . Number of children: Not on file  . Years of education: Not on file  . Highest education level: Not on file  Occupational History  . Not on file  Tobacco Use  . Smoking status: Current Every Day Smoker    Packs/day: 1.00    Years: 30.00    Pack years: 30.00    Types: Cigarettes    Start date: 07/28/1979  . Smokeless tobacco: Never Used  . Tobacco comment: pt is using a patch to help quit  Vaping Use  .  Vaping Use: Never used  Substance and Sexual Activity  . Alcohol use: Yes    Alcohol/week: 0.0 standard drinks    Comment: rarely  . Drug use: No  . Sexual activity: Yes    Birth control/protection: Post-menopausal  Other Topics Concern  . Not on file  Social History Narrative  . Not on file   Social Determinants of Health   Financial Resource Strain:   . Difficulty of Paying Living Expenses: Not on file  Food Insecurity:   . Worried About Charity fundraiser in the Last Year: Not on file  . Ran Out of Food in the Last Year: Not on file  Transportation Needs:   . Lack of Transportation (Medical): Not on file  . Lack of Transportation (Non-Medical): Not on file  Physical Activity:   . Days of Exercise per Week: Not on file  . Minutes of Exercise per Session: Not on file  Stress:   . Feeling of Stress : Not on file  Social Connections:   . Frequency of Communication with Friends and Family: Not on file  . Frequency of Social Gatherings with Friends and Family: Not on file  . Attends Religious Services: Not on file  . Active Member of Clubs or Organizations: Not on file  . Attends Archivist Meetings: Not on file  . Marital Status: Not on file  Intimate Partner Violence:   . Fear of Current or Ex-Partner: Not on file  . Emotionally Abused: Not on file  . Physically Abused: Not on file  . Sexually Abused: Not on file      Review of Systems  Constitutional: Negative for chills, diaphoresis and fatigue.  HENT: Negative for ear pain, postnasal drip and sinus pressure.   Eyes: Negative for photophobia, discharge, redness, itching and visual disturbance.  Respiratory: Negative for cough, shortness of breath and wheezing.   Cardiovascular: Negative for chest pain, palpitations and leg swelling.  Gastrointestinal: Negative for abdominal pain, constipation, diarrhea, nausea and vomiting.  Genitourinary: Negative for dysuria and flank pain.  Musculoskeletal: Negative  for arthralgias, back pain, gait problem and neck pain.  Skin: Negative for color change.  Allergic/Immunologic: Negative for environmental allergies and food allergies.  Neurological: Negative for dizziness and headaches.  Hematological: Does not bruise/bleed easily.  Psychiatric/Behavioral: Negative for agitation, behavioral problems (depression) and hallucinations.    Vital Signs: BP 140/82 Comment: 146/98  Pulse 88   Temp 97.8 F (36.6 C)   Resp 16   Ht 5\' 5"  (1.651 m)   Wt 117 lb (53.1 kg)   SpO2 99%   BMI 19.47 kg/m    Physical Exam Constitutional:  Appearance: Normal appearance.  HENT:     Head: Normocephalic and atraumatic.  Eyes:     Extraocular Movements: Extraocular movements intact.     Pupils: Pupils are equal, round, and reactive to light.  Cardiovascular:     Rate and Rhythm: Normal rate and regular rhythm.     Pulses: Normal pulses.     Heart sounds: Normal heart sounds.  Pulmonary:     Effort: Pulmonary effort is normal.     Comments: Decreased bilateral bs  Skin:    General: Skin is warm.  Neurological:     Mental Status: She is alert.  Psychiatric:        Mood and Affect: Mood normal.        Behavior: Behavior normal.      Assessment/Plan: 1. Stage 3a chronic kidney disease (HCC) Worsening renal functions, avoid all NSAIDS, renal artery u/s is ordered to look into fibromuscular dysplasia?? MM no other indications   2. Chronic obstructive pulmonary disease, unspecified COPD type ([place]) Continue all MDI, Needs CT for lung cancer surveillance  - CT Chest Wo Contrast; Future  3. Nicotine dependence with current use Encouraged for smoking cessation   4. Benign hypertension Continue tenormin, monitor bp at home   General Counseling: Devi verbalizes understanding of the findings of todays visit and agrees with plan of treatment. I have discussed any further diagnostic evaluation that may be needed or ordered today. We also reviewed her  medications today. she has been encouraged to call the office with any questions or concerns that should arise related to todays visit.    Orders Placed This Encounter  Procedures  . CT Chest Wo Contrast  . VAS US RENAL ARTERY DUPLEX      Total time spent: 30Minutes Time spent includes review of chart, medications, test results, and follow up plan with the patient.      Dr Lavera Guise Internal medicine

## 2019-12-11 ENCOUNTER — Ambulatory Visit: Payer: PPO | Admitting: Dermatology

## 2019-12-11 ENCOUNTER — Other Ambulatory Visit: Payer: Self-pay

## 2019-12-11 DIAGNOSIS — D1800 Hemangioma unspecified site: Secondary | ICD-10-CM

## 2019-12-11 DIAGNOSIS — L821 Other seborrheic keratosis: Secondary | ICD-10-CM | POA: Diagnosis not present

## 2019-12-11 DIAGNOSIS — Z1283 Encounter for screening for malignant neoplasm of skin: Secondary | ICD-10-CM

## 2019-12-11 DIAGNOSIS — L853 Xerosis cutis: Secondary | ICD-10-CM

## 2019-12-11 DIAGNOSIS — L309 Dermatitis, unspecified: Secondary | ICD-10-CM

## 2019-12-11 DIAGNOSIS — L814 Other melanin hyperpigmentation: Secondary | ICD-10-CM

## 2019-12-11 DIAGNOSIS — L578 Other skin changes due to chronic exposure to nonionizing radiation: Secondary | ICD-10-CM

## 2019-12-11 DIAGNOSIS — D229 Melanocytic nevi, unspecified: Secondary | ICD-10-CM | POA: Diagnosis not present

## 2019-12-11 MED ORDER — TRIAMCINOLONE ACETONIDE 0.1 % EX CREA: 1.0000 "application " | TOPICAL_CREAM | CUTANEOUS | 1 refills | Status: AC

## 2019-12-11 NOTE — Progress Notes (Signed)
   Follow-Up Visit   Subjective  Shari Gutierrez is a 69 y.o. female who presents for the following: Annual Exam (Total body skin exam) and Rash (back, >1wk, using lotion and benadryl). The patient presents for Total-Body Skin Exam (TBSE) for skin cancer screening and mole check.  The following portions of the chart were reviewed this encounter and updated as appropriate:  Tobacco  Allergies  Meds  Problems  Med Hx  Surg Hx  Fam Hx     Review of Systems:  No other skin or systemic complaints except as noted in HPI or Assessment and Plan.  Objective  Well appearing patient in no apparent distress; mood and affect are within normal limits.  A full examination was performed including scalp, head, eyes, ears, nose, lips, neck, chest, axillae, abdomen, back, buttocks, bilateral upper extremities, bilateral lower extremities, hands, feet, fingers, toes, fingernails, and toenails. All findings within normal limits unless otherwise noted below.    Assessment & Plan    Lentigines - Scattered tan macules - Discussed due to sun exposure - Benign, observe - Call for any changes  Seborrheic Keratoses - Stuck-on, waxy, tan-brown papules and plaques  - Discussed benign etiology and prognosis. - Observe - Call for any changes  Melanocytic Nevi - Tan-brown and/or pink-flesh-colored symmetric macules and papules - Benign appearing on exam today - Observation - Call clinic for new or changing moles - Recommend daily use of broad spectrum spf 30+ sunscreen to sun-exposed areas.   Hemangiomas - Red papules - Discussed benign nature - Observe - Call for any changes  Actinic Damage - Chronic, secondary to cumulative UV/sun exposure - diffuse scaly erythematous macules with underlying dyspigmentation - Recommend daily broad spectrum sunscreen SPF 30+ to sun-exposed areas, reapply every 2 hours as needed.  - Call for new or changing lesions.  Skin cancer screening performed  today.  Eczema, chronic, is flared -exacerbated by xerosis back, chest Chronic, flared  Start TMC 0.1% cr qd/bid up to 5d/wk aa back,chest until clear then prn flares, avoid f/g/a  Start mild cleanser and moisturizer qd. triamcinolone cream (KENALOG) 0.1 % - back, chest   Xerosis - diffuse xerotic patches - recommend gentle, hydrating skin care - gentle skin care handout given -samples of Dove for sensitive skin soap and Cerave cr given  Return in about 1 year (around 12/10/2020) for TBSE.   I, Othelia Pulling, RMA, am acting as scribe for Sarina Ser, MD .  Documentation: I have reviewed the above documentation for accuracy and completeness, and I agree with the above.  Sarina Ser, MD

## 2019-12-16 ENCOUNTER — Encounter: Payer: Self-pay | Admitting: Dermatology

## 2019-12-16 ENCOUNTER — Encounter: Payer: PPO | Admitting: Obstetrics and Gynecology

## 2019-12-23 ENCOUNTER — Ambulatory Visit: Payer: PPO

## 2019-12-30 ENCOUNTER — Encounter: Payer: PPO | Admitting: Obstetrics and Gynecology

## 2019-12-30 ENCOUNTER — Other Ambulatory Visit: Payer: Self-pay

## 2019-12-30 MED ORDER — FENOFIBRATE 160 MG PO TABS: 160.0000 mg | ORAL_TABLET | Freq: Every day | ORAL | 1 refills | Status: DC

## 2019-12-31 ENCOUNTER — Ambulatory Visit: Payer: PPO

## 2020-01-06 ENCOUNTER — Encounter: Payer: Self-pay | Admitting: Nurse Practitioner

## 2020-01-06 ENCOUNTER — Ambulatory Visit (INDEPENDENT_AMBULATORY_CARE_PROVIDER_SITE_OTHER): Payer: PPO | Admitting: Nurse Practitioner

## 2020-01-06 ENCOUNTER — Other Ambulatory Visit: Payer: Self-pay

## 2020-01-06 VITALS — BP 130/80 | HR 89 | Temp 97.6°F | Resp 16 | Ht 65.0 in | Wt 116.2 lb

## 2020-01-06 DIAGNOSIS — I6529 Occlusion and stenosis of unspecified carotid artery: Secondary | ICD-10-CM | POA: Diagnosis not present

## 2020-01-06 DIAGNOSIS — F17210 Nicotine dependence, cigarettes, uncomplicated: Secondary | ICD-10-CM

## 2020-01-06 DIAGNOSIS — M542 Cervicalgia: Secondary | ICD-10-CM

## 2020-01-06 NOTE — Progress Notes (Signed)
[facility] [place], [place] [address]  Internal MEDICINE  Office Visit Note  Patient Name: Shari Gutierrez  [id number]  [id number]  Date of Service: 01/06/2020   Pt is here for a sick visit  Chief Complaint  Patient presents with  . Acute Visit  . Neck Pain    back of neck     The patient presents for acute visit. She does have right neck pain and tenderness. Worse when turning or bending her head to the right. She states that sometimes, this does radiate to the front of the right side of neck. She did have carotid doppler done several years ago with no significant stenosis noted. She is also concerned about this because with her last few visits here, blood pressure has been borderline elevated. Today, blood pressure is good at 130/80. The patient is an everyday smoker.   .     Current Medication:  Outpatient Encounter Medications as of 01/06/2020  Medication Sig  . albuterol (VENTOLIN HFA) 108 (90 Base) MCG/ACT inhaler Inhale 1-2 puffs into the lungs every 6 (six) hours as needed for wheezing or shortness of breath.  . ALPRAZolam (XANAX) 0.25 MG tablet Take 1 tablet (0.25 mg total) by mouth at bedtime as needed for anxiety.  . budesonide-formoterol (SYMBICORT) 160-4.5 MCG/ACT inhaler Take  2 puffs by po twice  A day  . cholecalciferol (VITAMIN D) 1000 UNITS tablet Take 1,000 Units by mouth daily.   Marland Kitchen conjugated estrogens (PREMARIN) vaginal cream Place 0.27 Applicatorfuls vaginally 2 (two) times a week.  . fenofibrate 160 MG tablet Take 1 tablet (160 mg total) by mouth daily.  Marland Kitchen latanoprost (XALATAN) 0.005 % ophthalmic solution Place 1 drop into both eyes at bedtime.   . meclizine (ANTIVERT) 25 MG tablet TAKE 1 TABLET (25 MG TOTAL) BY MOUTH 2 (TWO) TIMES DAILY AS NEEDED FOR DIZZINESS.  . metoprolol tartrate (LOPRESSOR) 25 MG tablet TAKE 1 TABLET BY MOUTH TWICE A DAY  . Multiple Vitamin (MULTIVITAMIN WITH MINERALS) TABS tablet Take 1 tablet by mouth  3 (three) times a week.  . nitrofurantoin, macrocrystal-monohydrate, (MACROBID) 100 MG capsule Take 1 capsule (100 mg total) by mouth 2 (two) times daily.  . Tiotropium Bromide Monohydrate (SPIRIVA RESPIMAT) 2.5 MCG/ACT AERS Inhale 2 puffs into the lungs daily.  . traMADol (ULTRAM) 50 MG tablet Take 1 tablet (50 mg total) by mouth every 8 (eight) hours as needed.  . triamcinolone cream (KENALOG) 0.1 % Apply 1 application topically as directed. Qd to bid up to 5 days a week to itchy rash on back and chest until clear, then prn flares, avoid face, groin, axilla   No facility-administered encounter medications on file as of 01/06/2020.      Medical History: Past Medical History:  Diagnosis Date  . Anxiety   . COPD (chronic obstructive pulmonary disease) ([place])   . 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 Gall stones   . Hematuria   . Hyperlipidemia   . Hypertension   . Reflux   . Sinusitis   . Vitamin D deficiency     Today's Vitals   01/06/20 1019  BP: 130/80  Pulse: 89  Resp: 16  Temp: 97.6 F (36.4 C)  SpO2: 97%  Weight: 116 lb 3.2 oz (52.7 kg)  Height: 5\' 5"  (1.651 m)   Body mass index is 19.34 kg/m.  Review of Systems  Constitutional: Negative for activity change, chills, fatigue and unexpected weight change.  HENT: Negative for congestion, postnasal drip,  rhinorrhea, sneezing and sore throat.   Respiratory: Negative for cough, chest tightness, shortness of breath and wheezing.   Cardiovascular: Negative for chest pain and palpitations.  Gastrointestinal: Negative for abdominal pain, constipation, diarrhea, nausea and vomiting.  Musculoskeletal: Positive for myalgias and neck pain. Negative for arthralgias, back pain and joint swelling.  Skin: Negative for rash.  Allergic/Immunologic: Negative for environmental allergies.  Neurological: Negative for dizziness, tremors, numbness and headaches.  Hematological: Negative for adenopathy. Does not bruise/bleed easily.   Psychiatric/Behavioral: Negative for behavioral problems (Depression), sleep disturbance and suicidal ideas. The patient is not nervous/anxious.     Physical Exam Vitals and nursing note reviewed.  Constitutional:      General: She is not in acute distress.    Appearance: Normal appearance. She is well-developed. She is not diaphoretic.  HENT:     Head: Normocephalic and atraumatic.     Mouth/Throat:     Pharynx: No oropharyngeal exudate.  Eyes:     Pupils: Pupils are equal, round, and reactive to light.  Neck:     Thyroid: No thyromegaly.     Vascular: Carotid bruit present. No JVD.     Trachea: No tracheal deviation.     Comments: Very soft carotid bruit auscultated over the right side of the neck.  Cardiovascular:     Rate and Rhythm: Normal rate and regular rhythm.     Heart sounds: Normal heart sounds. No murmur heard.  No friction rub. No gallop.   Pulmonary:     Effort: Pulmonary effort is normal. No respiratory distress.     Breath sounds: Normal breath sounds. No wheezing or rales.  Chest:     Chest wall: No tenderness.  Abdominal:     General: Bowel sounds are normal.     Palpations: Abdomen is soft.  Musculoskeletal:        General: Normal range of motion.     Cervical back: Normal range of motion and neck supple. Pain with movement and muscular tenderness present.  Lymphadenopathy:     Cervical: No cervical adenopathy.  Skin:    General: Skin is warm and dry.  Neurological:     Mental Status: She is alert and oriented to person, place, and time.     Cranial Nerves: No cranial nerve deficit.  Psychiatric:        Mood and Affect: Mood normal.        Behavior: Behavior normal.        Thought Content: Thought content normal.        Judgment: Judgment normal.     Assessment/Plan: 1. Cervicalgia Believe to be related to muscle tension and stress. Recommend gentle stretching for the neck.may also take ibuprofen or naproxen as needed and as indicated for neck  pain.   2. Stenosis of carotid artery, unspecified laterality Soft bruit over right side of neck. Will get carotid doppler for further evaluation.  - US Carotid Bilateral; Future  3. Nicotine dependence, cigarettes, uncomplicated Will get carotid doppler for evaluation of possible carotid stenosis.  General Counseling: Payden verbalizes understanding of the findings of todays visit and agrees with plan of treatment. I have discussed any further diagnostic evaluation that may be needed or ordered today. We also reviewed her medications today. she has been encouraged to call the office with any questions or concerns that should arise related to todays visit.    Counseling:  This patient was seen by Leretha Pol FNP Collaboration with Dr Lavera Guise as a part  of collaborative care agreement  Orders Placed This Encounter  Procedures  . US Carotid Bilateral     Time spent: 25 Minutes

## 2020-01-09 ENCOUNTER — Ambulatory Visit: Payer: PPO

## 2020-01-09 ENCOUNTER — Other Ambulatory Visit: Payer: Self-pay

## 2020-01-09 DIAGNOSIS — I6529 Occlusion and stenosis of unspecified carotid artery: Secondary | ICD-10-CM

## 2020-01-17 NOTE — Procedures (Signed)
[place], [place] [address]  DATE OF SERVICE: 01/09/2020  CAROTID DOPPLER INTERPRETATION:  Bilateral Carotid Ultrsasound and Color Doppler Examination was performed. The RIGHT CCA shows very mild plaque in the vessel. The LEFT CCA shows very mild plaque in the vessel. There was no significant intimal thickening noted in the RIGHT carotid artery. There was no significant intimal thickening in the LEFT carotid artery.  The RIGHT CCA shows peak systolic velocity of 99 cm per second. The end diastolic velocity is 17 cm per second on the RIGHT side. The RIGHT ICA shows peak systolic velocity of 94 per second. RIGHT sided ICA end diastolic velocity is 22 cm per second. The RIGHT ECA shows a peak systolic velocity of 89 cm per second. The ICA/CCA ratio is calculated to be 0.94. This suggests less than 50% stenosis. The Vertebral Artery shows antegrade flow.  The LEFT CCA shows peak systolic velocity of 932 cm per second. The end diastolic velocity is 20 cm per second on the LEFT side. The LEFT ICA shows peak systolic velocity of 82 per second. LEFT sided ICA end diastolic velocity is 27 cm per second. The LEFT ECA shows a peak systolic velocity of 355 cm per second. The ICA/CCA ratio is calculated to be 0.79. This suggests less than 50% stenosis. The Vertebral Artery shows antegrade flow.   Impression:    The RIGHT CAROTID shows less than 50% stenosis. The LEFT CAROTID shows less than 50% stenosis.  There is mild plaque formation noted on the LEFT and mild plaque on the RIGHT  side. Consider a repeat Carotid doppler if clinical situation and symptoms warrant in 6-12 months. Patient should be encouraged to change lifestyles such as smoking cessation, regular exercise and dietary modification. Use of statins in the right clinical setting and ASA is encouraged.  Allyne Gee, MD [facility] Pulmonary Critical Care Medicine

## 2020-01-18 NOTE — Progress Notes (Signed)
Mild plaque with <50% stenosis, bilaterally. Discuss at next visit.

## 2020-01-21 ENCOUNTER — Encounter: Payer: PPO | Admitting: Obstetrics and Gynecology

## 2020-01-21 NOTE — Progress Notes (Deleted)
Annual Exam

## 2020-01-22 ENCOUNTER — Encounter: Payer: PPO | Admitting: Obstetrics and Gynecology

## 2020-01-22 ENCOUNTER — Other Ambulatory Visit: Payer: Self-pay | Admitting: Nurse Practitioner

## 2020-01-22 ENCOUNTER — Telehealth: Payer: Self-pay

## 2020-01-22 DIAGNOSIS — J069 Acute upper respiratory infection, unspecified: Secondary | ICD-10-CM

## 2020-01-22 MED ORDER — AMOXICILLIN 875 MG PO TABS: 875.0000 mg | ORAL_TABLET | Freq: Two times a day (BID) | ORAL | 0 refills | Status: DC

## 2020-01-22 NOTE — Telephone Encounter (Signed)
Please let her know I sent in amoxicillin 875mg  twice daily for next 10 days. She should rest and increase fluids. Use OTC medications as needed and as indicated.

## 2020-01-22 NOTE — Telephone Encounter (Signed)
Pt.notified

## 2020-02-09 ENCOUNTER — Ambulatory Visit: Payer: PPO | Admitting: Internal Medicine

## 2020-02-10 ENCOUNTER — Encounter: Payer: PPO | Admitting: Obstetrics and Gynecology

## 2020-03-02 DIAGNOSIS — H40153 Residual stage of open-angle glaucoma, bilateral: Secondary | ICD-10-CM | POA: Diagnosis not present

## 2020-03-08 ENCOUNTER — Encounter: Payer: Self-pay | Admitting: Internal Medicine

## 2020-03-08 ENCOUNTER — Ambulatory Visit: Payer: PPO | Admitting: Internal Medicine

## 2020-03-08 VITALS — BP 144/80 | HR 80 | Temp 97.9°F | Resp 16 | Ht 60.0 in | Wt 116.8 lb

## 2020-03-08 DIAGNOSIS — R0602 Shortness of breath: Secondary | ICD-10-CM

## 2020-03-08 DIAGNOSIS — J449 Chronic obstructive pulmonary disease, unspecified: Secondary | ICD-10-CM

## 2020-03-08 DIAGNOSIS — F17210 Nicotine dependence, cigarettes, uncomplicated: Secondary | ICD-10-CM

## 2020-03-08 NOTE — Progress Notes (Signed)
[facility] [place], [place] [address]  Pulmonary Sleep Medicine   Office Visit Note  Patient Name: Shari Gutierrez DOB: [birth year] MRN [id number]  Date of Service: 03/08/2020  Complaints/HPI: COPD smoker who presents with complaints of cough and shortness of breath.  She states that she is still smoking unfortunately she has not been able to really quit smoking.  She has some shortness of breath and she has some coughing noted.  She also does note some wheezing on occasion.  No chest pain and no palpitations are noted at this time.  ROS  General: (-) fever, (-) chills, (-) night sweats, (-) weakness Skin: (-) rashes, (-) itching,. Eyes: (-) visual changes, (-) redness, (-) itching. Nose and Sinuses: (-) nasal stuffiness or itchiness, (-) postnasal drip, (-) nosebleeds, (-) sinus trouble. Mouth and Throat: (-) sore throat, (-) hoarseness. Neck: (-) swollen glands, (-) enlarged thyroid, (-) neck pain. Respiratory: + cough, (-) bloody sputum, + shortness of breath, + wheezing. Cardiovascular: - ankle swelling, (-) chest pain. Lymphatic: (-) lymph node enlargement. Neurologic: (-) numbness, (-) tingling. Psychiatric: (-) anxiety, (-) depression   Current Medication: Outpatient Encounter Medications as of 03/08/2020  Medication Sig  . albuterol (VENTOLIN HFA) 108 (90 Base) MCG/ACT inhaler Inhale 1-2 puffs into the lungs every 6 (six) hours as needed for wheezing or shortness of breath.  . ALPRAZolam (XANAX) 0.25 MG tablet Take 1 tablet (0.25 mg total) by mouth at bedtime as needed for anxiety.  . budesonide-formoterol (SYMBICORT) 160-4.5 MCG/ACT inhaler Take  2 puffs by po twice  A day  . cholecalciferol (VITAMIN D) 1000 UNITS tablet Take 1,000 Units by mouth daily.   Marland Kitchen conjugated estrogens (PREMARIN) vaginal cream Place 0.27 Applicatorfuls vaginally 2 (two) times a week.  . fenofibrate 160 MG tablet Take 1 tablet (160 mg total) by mouth daily.  Marland Kitchen  latanoprost (XALATAN) 0.005 % ophthalmic solution Place 1 drop into both eyes at bedtime.   . meclizine (ANTIVERT) 25 MG tablet TAKE 1 TABLET (25 MG TOTAL) BY MOUTH 2 (TWO) TIMES DAILY AS NEEDED FOR DIZZINESS.  . metoprolol tartrate (LOPRESSOR) 25 MG tablet TAKE 1 TABLET BY MOUTH TWICE A DAY  . Multiple Vitamin (MULTIVITAMIN WITH MINERALS) TABS tablet Take 1 tablet by mouth 3 (three) times a week.  . Tiotropium Bromide Monohydrate (SPIRIVA RESPIMAT) 2.5 MCG/ACT AERS Inhale 2 puffs into the lungs daily.  . traMADol (ULTRAM) 50 MG tablet Take 1 tablet (50 mg total) by mouth every 8 (eight) hours as needed.  . triamcinolone cream (KENALOG) 0.1 % Apply 1 application topically as directed. Qd to bid up to 5 days a week to itchy rash on back and chest until clear, then prn flares, avoid face, groin, axilla  . [DISCONTINUED] amoxicillin (AMOXIL) 875 MG tablet Take 1 tablet (875 mg total) by mouth 2 (two) times daily. (Patient not taking: Reported on 03/08/2020)  . [DISCONTINUED] nitrofurantoin, macrocrystal-monohydrate, (MACROBID) 100 MG capsule Take 1 capsule (100 mg total) by mouth 2 (two) times daily. (Patient not taking: Reported on 03/08/2020)   No facility-administered encounter medications on file as of 03/08/2020.    Surgical History: Past Surgical History:  Procedure Laterality Date  . APPENDECTOMY  1989  . CHOLECYSTECTOMY  02/26/2019  . COLONOSCOPY  2016  . OOPHORECTOMY  1989    Medical History: Past Medical History:  Diagnosis Date  . Anxiety   . COPD (chronic obstructive pulmonary disease) ([place])   . 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 Gall stones   .  Hematuria   . Hyperlipidemia   . Hypertension   . Reflux   . Sinusitis   . Vitamin D deficiency     Family History: Family History  Problem Relation Age of Onset  . Kidney failure Mother   . Cancer Father        Gastric  . Breast cancer Neg Hx   . Ovarian cancer Neg Hx   . Colon cancer Neg Hx   . Heart disease Neg Hx   . Diabetes Neg Hx      Social History: Social History   Socioeconomic History  . Marital status: Married    Spouse name: Not on file  . Number of children: Not on file  . Years of education: Not on file  . Highest education level: Not on file  Occupational History  . Not on file  Tobacco Use  . Smoking status: Current Every Day Smoker    Packs/day: 1.00    Years: 30.00    Pack years: 30.00    Types: Cigarettes    Start date: 07/28/1979  . Smokeless tobacco: Never Used  . Tobacco comment: pt is using a patch to help quit  Vaping Use  . Vaping Use: Never used  Substance and Sexual Activity  . Alcohol use: Yes    Alcohol/week: 0.0 standard drinks    Comment: rarely  . Drug use: No  . Sexual activity: Yes    Birth control/protection: Post-menopausal  Other Topics Concern  . Not on file  Social History Narrative  . Not on file   Social Determinants of Health   Financial Resource Strain: Not on file  Food Insecurity: Not on file  Transportation Needs: Not on file  Physical Activity: Not on file  Stress: Not on file  Social Connections: Not on file  Intimate Partner Violence: Not on file    Vital Signs: Blood pressure (!) 144/80, pulse 80, temperature 97.9 F (36.6 C), resp. rate 16, height 5' (1.524 m), weight 116 lb 12.8 oz (53 kg), SpO2 98 %.  Examination: General Appearance: The patient is well-developed, well-nourished, and in no distress. Skin: Gross inspection of skin unremarkable. Head: normocephalic, no gross deformities. Eyes: no gross deformities noted. ENT: ears appear grossly normal no exudates. Neck: Supple. No thyromegaly. No LAD. Respiratory: few rhonchi noted. Cardiovascular: Normal S1 and S2 without murmur or rub. Extremities: No cyanosis. pulses are equal. Neurologic: Alert and oriented. No involuntary movements.  LABS: No results found for this or any previous visit (from the past 2160 hour(s)).  Radiology: MM DIAG BREAST TOMO UNI LEFT  Result Date:  05/29/2019 CLINICAL DATA:  70 year old patient recalled from recent screening mammogram for evaluation of one view asymmetry in the superior left breast on the MLO projection. EXAM: DIGITAL DIAGNOSTIC UNILATERAL LEFT MAMMOGRAM WITH CAD AND TOMO COMPARISON:  May 19, 2019 and earlier priors ACR Breast Density Category c: The breast tissue is heterogeneously dense, which may obscure small masses. FINDINGS: Spot compression view of the left breast with tomography shows no persistent asymmetry. Normal fibroglandular tissue is seen. 90 degree lateral view of the left breast with tomography is negative. Mammographic images were processed with CAD. IMPRESSION: No evidence of malignancy in the left breast. RECOMMENDATION: Screening mammogram in one year.(Code:SM-B-01Y) I have discussed the findings and recommendations with the patient. If applicable, a reminder letter will be sent to the patient regarding the next appointment. BI-RADS CATEGORY  1: Negative. Electronically Signed   By: Curlene Dolphin M.D.   On:  05/29/2019 11:55    No results found.  No results found.    Assessment and Plan: Patient Active Problem List   Diagnosis Date Noted  . Cervicalgia 01/06/2020  . Stenosis of carotid artery 01/06/2020  . Encounter for general adult medical examination with abnormal findings 09/24/2019  . Status post laparoscopic cholecystectomy 03/06/2019  . Encounter for long-term (current) use of medications 08/28/2018  . Vitamin D deficiency 08/28/2018  . Dysuria 08/28/2018  . Acute midline low back pain without sciatica 04/24/2018  . Acute recurrent sinusitis 02/11/2018  . Chronic obstructive pulmonary disease ([place]) 02/11/2018  . Nicotine dependence, cigarettes, uncomplicated [id number]  . Cough 10/15/2017  . Abnormal renal function 10/15/2017  . Vertigo 09/23/2017  . Generalized anxiety disorder 09/23/2017  . Mixed hyperlipidemia 07/23/2017  . Mild intermittent asthma without complication [id number]  .  Essential hypertension 04/23/2017  . Acute non-recurrent maxillary sinusitis 04/23/2017  . Menopause 07/26/2016  . Vaginal atrophy 07/26/2016  . Female dyspareunia 07/26/2016  . Microscopic hematuria 07/28/2014     1. Shortness of breath Multifactorial with ongoing tobacco use as well as history of COPD - Spirometry with Graph  2. Obstructive chronic bronchitis without exacerbation ([place]) She will continue with her inhalers prescriptions reviewed medications reviewed in detail.  Follow-up pulmonary functions also addressed  3. Nicotine dependence, cigarettes, uncomplicated Smoking cessation strongly recommended the patient was counseled on appropriate measures to cut back and to stop smoking.  I think as long as she continues to smoke she will have issues with her ongoing shortness of breath.    General Counseling: I have discussed the findings of the evaluation and examination with Adylin.  I have also discussed any further diagnostic evaluation thatmay be needed or ordered today. Joyleen verbalizes understanding of the findings of todays visit. We also reviewed her medications today and discussed drug interactions and side effects including but not limited excessive drowsiness and altered mental states. We also discussed that there is always a risk not just to her but also people around her. she has been encouraged to call the office with any questions or concerns that should arise related to todays visit.  Orders Placed This Encounter  Procedures  . Spirometry with Graph    Order Specific Question:   Where should this test be performed?    Answer:   [facility]  . Pulmonary function test    Standing Status:   Future    Standing Expiration Date:   03/08/2021    Order Specific Question:   Where should this test be performed?    Answer:   [facility]     Time spent: 30  I have personally obtained a history, examined the patient, evaluated laboratory and  imaging results, formulated the assessment and plan and placed orders.    Allyne Gee, MD [facility] Pulmonary and Critical Care Sleep medicine

## 2020-03-08 NOTE — Patient Instructions (Signed)
Chronic Obstructive Pulmonary Disease  Chronic obstructive pulmonary disease (COPD) is a long-term (chronic) lung problem. When you have COPD, it is hard for air to get in and out of your lungs. Usually the condition gets worse over time, and your lungs will never return to normal. There are things you can do to keep yourself as healthy as possible. What are the causes?  Smoking. This is the most common cause.  Certain genes passed from parent to child (inherited). What increases the risk?  Being exposed to secondhand smoke from cigarettes, pipes, or cigars.  Being exposed to chemicals and other irritants, such as fumes and dust in the work environment.  Having chronic lung conditions or infections. What are the signs or symptoms?  Shortness of breath, especially during physical activity.  A long-term cough with a large amount of thick mucus. Sometimes, the cough may not have any mucus (dry cough).  Wheezing.  Breathing quickly.  Skin that looks gray or blue, especially in the fingers, toes, or lips.  Feeling tired (fatigue).  Weight loss.  Chest tightness.  Having infections often.  Episodes when breathing symptoms become much worse (exacerbations). At the later stages of this disease, you may have swelling in the ankles, feet, or legs. How is this treated?  Taking medicines.  Quitting smoking, if you smoke.  Rehabilitation. This includes steps to make your body work better. It may involve a team of specialists.  Doing exercises.  Making changes to your diet.  Using oxygen.  Lung surgery.  Lung transplant.  Comfort measures (palliative care). Follow these instructions at home: Medicines  Take over-the-counter and prescription medicines only as told by your doctor.  Talk to your doctor before taking any cough or allergy medicines. You may need to avoid medicines that cause your lungs to be dry. Lifestyle  If you smoke, stop smoking. Smoking makes the  problem worse.  Do not smoke or use any products that contain nicotine or tobacco. If you need help quitting, ask your doctor.  Avoid being around things that make your breathing worse. This may include smoke, chemicals, and fumes.  Stay active, but remember to rest as well.  Learn and use tips on how to manage stress and control your breathing.  Make sure you get enough sleep. Most adults need at least 7 hours of sleep every night.  Eat healthy foods. Eat smaller meals more often. Rest before meals. Controlled breathing Learn and use tips on how to control your breathing as told by your doctor. Try:  Breathing in (inhaling) through your nose for 1 second. Then, pucker your lips and breath out (exhale) through your lips for 2 seconds.  Putting one hand on your belly (abdomen). Breathe in slowly through your nose for 1 second. Your hand on your belly should move out. Pucker your lips and breathe out slowly through your lips. Your hand on your belly should move in as you breathe out.   Controlled coughing Learn and use controlled coughing to clear mucus from your lungs. Follow these steps: 1. Lean your head a little forward. 2. Breathe in deeply. 3. Try to hold your breath for 3 seconds. 4. Keep your mouth slightly open while coughing 2 times. 5. Spit any mucus out into a tissue. 6. Rest and do the steps again 1 or 2 times as needed. General instructions  Make sure you get all the shots (vaccines) that your doctor recommends. Ask your doctor about a flu shot and a pneumonia shot.    Use oxygen therapy and pulmonary rehabilitation if told by your doctor. If you need home oxygen therapy, ask your doctor if you should buy a tool to measure your oxygen level (oximeter).  Make a COPD action plan with your doctor. This helps you to know what to do if you feel worse than usual.  Manage any other conditions you have as told by your doctor.  Avoid going outside when it is very hot, cold, or  humid.  Avoid people who have a sickness you can catch (contagious).  Keep all follow-up visits. Contact a doctor if:  You cough up more mucus than usual.  There is a change in the color or thickness of the mucus.  It is harder to breathe than usual.  Your breathing is faster than usual.  You have trouble sleeping.  You need to use your medicines more often than usual.  You have trouble doing your normal activities such as getting dressed or walking around the house. Get help right away if:  You have shortness of breath while resting.  You have shortness of breath that stops you from: ? Being able to talk. ? Doing normal activities.  Your chest hurts for longer than 5 minutes.  Your skin color is more blue than usual.  Your pulse oximeter shows that you have low oxygen for longer than 5 minutes.  You have a fever.  You feel too tired to breathe normally. These symptoms may represent a serious problem that is an emergency. Do not wait to see if the symptoms will go away. Get medical help right away. Call your local emergency services (911 in the [place] Do not drive yourself to the hospital. Summary  Chronic obstructive pulmonary disease (COPD) is a long-term lung problem.  The way your lungs work will never return to normal. Usually the condition gets worse over time. There are things you can do to keep yourself as healthy as possible.  Take over-the-counter and prescription medicines only as told by your doctor.  If you smoke, stop. Smoking makes the problem worse. This information is not intended to replace advice given to you by your health care provider. Make sure you discuss any questions you have with your health care provider. Document Revised: 11/25/2019 Document Reviewed: 11/25/2019 Elsevier Patient Education  2021 Elsevier Inc.   

## 2020-03-11 ENCOUNTER — Ambulatory Visit: Payer: PPO | Admitting: Hospice and Palliative Medicine

## 2020-04-02 ENCOUNTER — Other Ambulatory Visit: Payer: Self-pay

## 2020-04-02 DIAGNOSIS — J4520 Mild intermittent asthma, uncomplicated: Secondary | ICD-10-CM

## 2020-04-02 MED ORDER — BUDESONIDE-FORMOTEROL FUMARATE 160-4.5 MCG/ACT IN AERO: INHALATION_SPRAY | RESPIRATORY_TRACT | 3 refills | Status: DC

## 2020-04-15 ENCOUNTER — Encounter: Payer: PPO | Admitting: Obstetrics and Gynecology

## 2020-04-20 ENCOUNTER — Telehealth: Payer: Self-pay | Admitting: Internal Medicine

## 2020-04-20 NOTE — Progress Notes (Signed)
  Chronic Care Management   Note  04/20/2020 Name: Shari Gutierrez MRN: [id number] DOB: [birth year]  Shari Gutierrez is a 70 y.o. year old female who is a primary care patient of Lavera Guise, MD. I reached out to Traci Sermon by phone today in response to a referral sent by Ms. Carroll PCP, Lavera Guise, MD.   Ms. Staheli was given information about Chronic Care Management services today including:  1. CCM service includes personalized support from designated clinical staff supervised by her physician, including individualized plan of care and coordination with other care providers 2. 24/7 contact phone numbers for assistance for urgent and routine care needs. 3. Service will only be billed when office clinical staff spend 20 minutes or more in a month to coordinate care. 4. Only one practitioner may furnish and bill the service in a calendar month. 5. The patient may stop CCM services at any time (effective at the end of the month) by phone call to the office staff.   Patient wishes to consider information provided and/or speak with a member of the care team before deciding about enrollment in care management services.   Follow up plan:   Carley Perdue [employer] Scheduler

## 2020-08-04 ENCOUNTER — Other Ambulatory Visit: Payer: Self-pay | Admitting: Internal Medicine

## 2020-08-04 DIAGNOSIS — Z1231 Encounter for screening mammogram for malignant neoplasm of breast: Secondary | ICD-10-CM

## 2020-08-05 ENCOUNTER — Other Ambulatory Visit: Payer: Self-pay

## 2020-08-05 ENCOUNTER — Other Ambulatory Visit: Payer: Self-pay | Admitting: Nurse Practitioner

## 2020-08-05 DIAGNOSIS — J4520 Mild intermittent asthma, uncomplicated: Secondary | ICD-10-CM

## 2020-08-05 MED ORDER — BUDESONIDE-FORMOTEROL FUMARATE 160-4.5 MCG/ACT IN AERO: INHALATION_SPRAY | RESPIRATORY_TRACT | 3 refills | Status: AC

## 2020-08-05 MED ORDER — METOPROLOL TARTRATE 25 MG PO TABS: 25.0000 mg | ORAL_TABLET | Freq: Two times a day (BID) | ORAL | 0 refills | Status: DC

## 2020-08-10 ENCOUNTER — Other Ambulatory Visit: Payer: Self-pay

## 2020-08-10 ENCOUNTER — Ambulatory Visit
Admission: RE | Admit: 2020-08-10 | Discharge: 2020-08-10 | Disposition: A | Discharge status: Home / Self Care | Payer: PPO | Source: Ambulatory Visit | Attending: Internal Medicine | Admitting: Internal Medicine

## 2020-08-10 DIAGNOSIS — Z1231 Encounter for screening mammogram for malignant neoplasm of breast: Secondary | ICD-10-CM | POA: Diagnosis not present

## 2020-09-08 ENCOUNTER — Ambulatory Visit: Payer: PPO | Admitting: Internal Medicine

## 2020-09-09 ENCOUNTER — Ambulatory Visit: Payer: PPO | Admitting: Nurse Practitioner

## 2020-09-21 ENCOUNTER — Ambulatory Visit: Payer: PPO | Admitting: Internal Medicine

## 2020-09-29 ENCOUNTER — Encounter: Payer: Self-pay | Admitting: Nurse Practitioner

## 2020-09-29 ENCOUNTER — Telehealth: Payer: Self-pay

## 2020-09-29 ENCOUNTER — Encounter: Payer: PPO | Admitting: Internal Medicine

## 2020-09-29 ENCOUNTER — Other Ambulatory Visit: Payer: Self-pay

## 2020-09-29 NOTE — Telephone Encounter (Signed)
Spoke with pt that our connection poor either she can come in the office for sinus infection or she can go to urgent care

## 2020-10-01 ENCOUNTER — Telehealth: Payer: Self-pay

## 2020-10-01 NOTE — Telephone Encounter (Signed)
Left vm to confirm 10/06/20 appointment-Toni

## 2020-10-06 ENCOUNTER — Ambulatory Visit: Payer: PPO | Admitting: Internal Medicine

## 2020-10-18 ENCOUNTER — Telehealth: Payer: Self-pay

## 2020-10-18 NOTE — Telephone Encounter (Signed)
Lmom in regards to pulmonary follow up scheduled for 10-19-20. Patient has not yet had pft completed appointment needs to be rescheduled for after procedure.

## 2020-10-19 ENCOUNTER — Ambulatory Visit: Payer: PPO | Admitting: Internal Medicine

## 2020-11-27 ENCOUNTER — Other Ambulatory Visit: Payer: Self-pay | Admitting: Nurse Practitioner

## 2020-11-27 ENCOUNTER — Other Ambulatory Visit: Payer: Self-pay | Admitting: Internal Medicine

## 2020-11-27 DIAGNOSIS — J449 Chronic obstructive pulmonary disease, unspecified: Secondary | ICD-10-CM

## 2020-11-29 ENCOUNTER — Other Ambulatory Visit: Payer: Self-pay | Admitting: Nurse Practitioner

## 2020-11-29 DIAGNOSIS — J449 Chronic obstructive pulmonary disease, unspecified: Secondary | ICD-10-CM

## 2020-11-30 DIAGNOSIS — F17210 Nicotine dependence, cigarettes, uncomplicated: Secondary | ICD-10-CM | POA: Diagnosis not present

## 2020-12-08 ENCOUNTER — Other Ambulatory Visit: Payer: Self-pay | Admitting: Internal Medicine

## 2020-12-20 ENCOUNTER — Ambulatory Visit: Payer: PPO | Admitting: Dermatology

## 2020-12-20 ENCOUNTER — Other Ambulatory Visit: Payer: Self-pay

## 2020-12-20 DIAGNOSIS — D229 Melanocytic nevi, unspecified: Secondary | ICD-10-CM

## 2020-12-20 DIAGNOSIS — D692 Other nonthrombocytopenic purpura: Secondary | ICD-10-CM

## 2020-12-20 DIAGNOSIS — Z1283 Encounter for screening for malignant neoplasm of skin: Secondary | ICD-10-CM

## 2020-12-20 DIAGNOSIS — D1801 Hemangioma of skin and subcutaneous tissue: Secondary | ICD-10-CM

## 2020-12-20 DIAGNOSIS — L814 Other melanin hyperpigmentation: Secondary | ICD-10-CM | POA: Diagnosis not present

## 2020-12-20 DIAGNOSIS — L820 Inflamed seborrheic keratosis: Secondary | ICD-10-CM

## 2020-12-20 DIAGNOSIS — L821 Other seborrheic keratosis: Secondary | ICD-10-CM

## 2020-12-20 DIAGNOSIS — L578 Other skin changes due to chronic exposure to nonionizing radiation: Secondary | ICD-10-CM

## 2020-12-20 NOTE — Patient Instructions (Addendum)

## 2020-12-20 NOTE — Progress Notes (Signed)
Follow-Up Visit   Subjective  Shari Gutierrez is a 70 y.o. female who presents for the following: Annual Exam (No history of skin cancer or abnormal moles - TBSE today). The patient presents for Total-Body Skin Exam (TBSE) for skin cancer screening and mole check.  Patient has an irritating spot on the calf she would like treated. The patient has spots, moles and lesions to be evaluated, some may be new or changing and the patient has concerns that these could be cancer.  The following portions of the chart were reviewed this encounter and updated as appropriate:   Tobacco  Allergies  Meds  Problems  Med Hx  Surg Hx  Fam Hx     Review of Systems:  No other skin or systemic complaints except as noted in HPI or Assessment and Plan.  Objective  Well appearing patient in no apparent distress; mood and affect are within normal limits.  A full examination was performed including scalp, head, eyes, ears, nose, lips, neck, chest, axillae, abdomen, back, buttocks, bilateral upper extremities, bilateral lower extremities, hands, feet, fingers, toes, fingernails, and toenails. All findings within normal limits unless otherwise noted below.  Right prox calf Erythematous keratotic or waxy stuck-on papule or plaque.    Assessment & Plan   Purpura - Chronic; persistent and recurrent.  Treatable, but not curable. - Violaceous macules and patches - Benign - Related to trauma, age, sun damage and/or use of blood thinners, chronic use of topical and/or oral steroids - Observe - Can use OTC arnica containing moisturizer such as Dermend Bruise Formula if desired - Call for worsening or other concerns  Lentigines - Scattered tan macules - Due to sun exposure - Benign-appearing, observe - Recommend daily broad spectrum sunscreen SPF 30+ to sun-exposed areas, reapply every 2 hours as needed. - Call for any changes  Seborrheic Keratoses - Stuck-on, waxy, tan-brown papules and/or plaques   - Benign-appearing - Discussed benign etiology and prognosis. - Observe - Call for any changes  Melanocytic Nevi - Tan-brown and/or pink-flesh-colored symmetric macules and papules - Benign appearing on exam today - Observation - Call clinic for new or changing moles - Recommend daily use of broad spectrum spf 30+ sunscreen to sun-exposed areas.   Hemangiomas - Red papules - Discussed benign nature - Observe - Call for any changes  Actinic Damage - Chronic condition, secondary to cumulative UV/sun exposure - diffuse scaly erythematous macules with underlying dyspigmentation - Recommend daily broad spectrum sunscreen SPF 30+ to sun-exposed areas, reapply every 2 hours as needed.  - Staying in the shade or wearing long sleeves, sun glasses (UVA+UVB protection) and wide brim hats (4-inch brim around the entire circumference of the hat) are also recommended for sun protection.  - Call for new or changing lesions.  Skin cancer screening performed today.  Inflamed seborrheic keratosis Right prox calf  RTC if not resolved after 6 weeks.  Destruction of lesion - Right prox calf Complexity: simple   Destruction method: cryotherapy   Informed consent: discussed and consent obtained   Timeout:  patient name, date of birth, surgical site, and procedure verified Lesion destroyed using liquid nitrogen: Yes   Region frozen until ice ball extended beyond lesion: Yes   Outcome: patient tolerated procedure well with no complications   Post-procedure details: wound care instructions given    Skin cancer screening  Return in about 1 year (around 12/20/2021) for TBSE.  I, Ashok Cordia, CMA, am acting as scribe for Sarina Ser, MD .  Documentation: I have reviewed the above documentation for accuracy and completeness, and I agree with the above.  Sarina Ser, MD

## 2020-12-27 DIAGNOSIS — H40153 Residual stage of open-angle glaucoma, bilateral: Secondary | ICD-10-CM | POA: Diagnosis not present

## 2021-01-03 ENCOUNTER — Encounter: Payer: Self-pay | Admitting: Dermatology

## 2021-01-25 DIAGNOSIS — Z20822 Contact with and (suspected) exposure to covid-19: Secondary | ICD-10-CM | POA: Diagnosis not present

## 2021-01-25 DIAGNOSIS — J441 Chronic obstructive pulmonary disease with (acute) exacerbation: Secondary | ICD-10-CM | POA: Diagnosis not present

## 2021-01-25 DIAGNOSIS — J40 Bronchitis, not specified as acute or chronic: Secondary | ICD-10-CM | POA: Diagnosis not present

## 2021-01-25 DIAGNOSIS — R059 Cough, unspecified: Secondary | ICD-10-CM | POA: Diagnosis not present

## 2021-03-04 ENCOUNTER — Other Ambulatory Visit: Payer: Self-pay | Admitting: Internal Medicine

## 2021-03-04 ENCOUNTER — Telehealth: Payer: Self-pay

## 2021-03-04 NOTE — Telephone Encounter (Signed)
Lmom that we received a phar for Fenofibrate  that she is still our pt  due to pt can last few appt and if she is she need appt and we can send 30 days

## 2021-03-04 NOTE — Telephone Encounter (Signed)
Pt need appt for refills  ?

## 2021-03-07 NOTE — Telephone Encounter (Signed)
I dont think we are her PCP

## 2021-03-21 DIAGNOSIS — Z01818 Encounter for other preprocedural examination: Secondary | ICD-10-CM | POA: Diagnosis not present

## 2021-03-21 DIAGNOSIS — F17218 Nicotine dependence, cigarettes, with other nicotine-induced disorders: Secondary | ICD-10-CM | POA: Diagnosis not present

## 2021-03-31 DIAGNOSIS — H40153 Residual stage of open-angle glaucoma, bilateral: Secondary | ICD-10-CM | POA: Diagnosis not present

## 2021-08-16 DIAGNOSIS — H40153 Residual stage of open-angle glaucoma, bilateral: Secondary | ICD-10-CM | POA: Diagnosis not present

## 2021-08-31 ENCOUNTER — Other Ambulatory Visit: Payer: Self-pay | Admitting: Emergency Medicine

## 2021-08-31 DIAGNOSIS — Z1231 Encounter for screening mammogram for malignant neoplasm of breast: Secondary | ICD-10-CM

## 2021-09-20 DIAGNOSIS — F17200 Nicotine dependence, unspecified, uncomplicated: Secondary | ICD-10-CM | POA: Diagnosis not present

## 2021-09-20 DIAGNOSIS — F17210 Nicotine dependence, cigarettes, uncomplicated: Secondary | ICD-10-CM | POA: Diagnosis not present

## 2021-09-28 ENCOUNTER — Ambulatory Visit
Admission: RE | Admit: 2021-09-28 | Discharge: 2021-09-28 | Disposition: A | Discharge status: Home / Self Care | Payer: PPO | Source: Ambulatory Visit | Attending: Emergency Medicine | Admitting: Emergency Medicine

## 2021-09-28 DIAGNOSIS — Z1231 Encounter for screening mammogram for malignant neoplasm of breast: Secondary | ICD-10-CM | POA: Insufficient documentation

## 2021-10-18 DIAGNOSIS — H25043 Posterior subcapsular polar age-related cataract, bilateral: Secondary | ICD-10-CM | POA: Diagnosis not present

## 2021-10-18 DIAGNOSIS — H25013 Cortical age-related cataract, bilateral: Secondary | ICD-10-CM | POA: Diagnosis not present

## 2021-10-18 DIAGNOSIS — H2512 Age-related nuclear cataract, left eye: Secondary | ICD-10-CM | POA: Diagnosis not present

## 2021-10-18 DIAGNOSIS — H401131 Primary open-angle glaucoma, bilateral, mild stage: Secondary | ICD-10-CM | POA: Diagnosis not present

## 2021-10-18 DIAGNOSIS — H2513 Age-related nuclear cataract, bilateral: Secondary | ICD-10-CM | POA: Diagnosis not present

## 2021-12-28 ENCOUNTER — Ambulatory Visit: Payer: PPO | Admitting: Dermatology

## 2021-12-28 VITALS — BP 124/75 | HR 81

## 2021-12-28 DIAGNOSIS — L853 Xerosis cutis: Secondary | ICD-10-CM | POA: Diagnosis not present

## 2021-12-28 DIAGNOSIS — L578 Other skin changes due to chronic exposure to nonionizing radiation: Secondary | ICD-10-CM

## 2021-12-28 DIAGNOSIS — L821 Other seborrheic keratosis: Secondary | ICD-10-CM | POA: Diagnosis not present

## 2021-12-28 DIAGNOSIS — D229 Melanocytic nevi, unspecified: Secondary | ICD-10-CM

## 2021-12-28 DIAGNOSIS — L814 Other melanin hyperpigmentation: Secondary | ICD-10-CM | POA: Diagnosis not present

## 2021-12-28 DIAGNOSIS — D692 Other nonthrombocytopenic purpura: Secondary | ICD-10-CM

## 2021-12-28 DIAGNOSIS — Z1283 Encounter for screening for malignant neoplasm of skin: Secondary | ICD-10-CM

## 2021-12-28 NOTE — Progress Notes (Signed)
   Follow-Up Visit   Subjective  Shari Gutierrez is a 71 y.o. female who presents for the following: Annual Exam (No history of skin cancer or abnormal moles - The patient presents for Total-Body Skin Exam (TBSE) for skin cancer screening and mole check.  The patient has spots, moles and lesions to be evaluated, some may be new or changing and the patient has concerns that these could be cancer./).  The following portions of the chart were reviewed this encounter and updated as appropriate:   Tobacco  Allergies  Meds  Problems  Med Hx  Surg Hx  Fam Hx     Review of Systems:  No other skin or systemic complaints except as noted in HPI or Assessment and Plan.  Objective  Well appearing patient in no apparent distress; mood and affect are within normal limits.  A full examination was performed including scalp, head, eyes, ears, nose, lips, neck, chest, axillae, abdomen, back, buttocks, bilateral upper extremities, bilateral lower extremities, hands, feet, fingers, toes, fingernails, and toenails. All findings within normal limits unless otherwise noted below.  Xerosis  Arms Purpura    Assessment & Plan   Lentigines - Scattered tan macules - Due to sun exposure - Benign-appearing, observe - Recommend daily broad spectrum sunscreen SPF 30+ to sun-exposed areas, reapply every 2 hours as needed. - Call for any changes  Seborrheic Keratoses - Stuck-on, waxy, tan-brown papules and/or plaques  - Benign-appearing - Discussed benign etiology and prognosis. - Observe - Call for any changes  Melanocytic Nevi - Tan-brown and/or pink-flesh-colored symmetric macules and papules - Benign appearing on exam today - Observation - Call clinic for new or changing moles - Recommend daily use of broad spectrum spf 30+ sunscreen to sun-exposed areas.   Hemangiomas - Red papules - Discussed benign nature - Observe - Call for any changes  Actinic Damage - Chronic condition,  secondary to cumulative UV/sun exposure - diffuse scaly erythematous macules with underlying dyspigmentation - Recommend daily broad spectrum sunscreen SPF 30+ to sun-exposed areas, reapply every 2 hours as needed.  - Staying in the shade or wearing long sleeves, sun glasses (UVA+UVB protection) and wide brim hats (4-inch brim around the entire circumference of the hat) are also recommended for sun protection.  - Call for new or changing lesions.  Skin cancer screening performed today.  Purpura ([place]) Arms Purpura - Chronic; persistent and recurrent.  Treatable, but not curable. - Violaceous macules and patches - Benign - Related to trauma, age, sun damage and/or use of blood thinners, chronic use of topical and/or oral steroids - Observe - Can use OTC arnica containing moisturizer such as Dermend Bruise Formula if desired - Call for worsening or other concerns  Xerosis cutis Recommend Amlacin Rapid Relief lotion  Return in about 1 year (around 12/29/2022) for TBSE.  I, Ashok Cordia, CMA, am acting as scribe for Sarina Ser, MD . Documentation: I have reviewed the above documentation for accuracy and completeness, and I agree with the above.  Sarina Ser, MD

## 2021-12-28 NOTE — Patient Instructions (Addendum)
Recommend Amlactin Rapid Relief lotion for dryness   Due to recent changes in healthcare laws, you may see results of your pathology and/or laboratory studies on MyChart before the doctors have had a chance to review them. We understand that in some cases there may be results that are confusing or concerning to you. Please understand that not all results are received at the same time and often the doctors may need to interpret multiple results in order to provide you with the best plan of care or course of treatment. Therefore, we ask that you please give [place] 2 business days to thoroughly review all your results before contacting the office for clarification. Should we see a critical lab result, you will be contacted sooner.   If You Need Anything After Your Visit  If you have any questions or concerns for your doctor, please call our main line at [phone] and press option 4 to reach your doctor's medical assistant. If no one answers, please leave a voicemail as directed and we will return your call as soon as possible. Messages left after 4 pm will be answered the following business day.   You may also send [place] a message via [place]. We typically respond to MyChart messages within 1-2 business days.  For prescription refills, please ask your pharmacy to contact our office. Our fax number is [phone].  If you have an urgent issue when the clinic is closed that cannot wait until the next business day, you can page your doctor at the number below.    Please note that while we do our best to be available for urgent issues outside of office hours, we are not available 24/7.   If you have an urgent issue and are unable to reach [place], you may choose to seek medical care at your doctor's office, retail clinic, urgent care center, or emergency room.  If you have a medical emergency, please immediately call 911 or go to the emergency department.  Pager Numbers  - Dr. Nehemiah Massed: [phone]  -  Dr. Laurence Ferrari: [phone]  - Dr. Nicole Kindred: [phone]  In the event of inclement weather, please call our main line at [phone] for an update on the status of any delays or closures.  Dermatology Medication Tips: Please keep the boxes that topical medications come in in order to help keep track of the instructions about where and how to use these. Pharmacies typically print the medication instructions only on the boxes and not directly on the medication tubes.   If your medication is too expensive, please contact our office at [phone] option 4 or send [place] a message through [place].   We are unable to tell what your co-pay for medications will be in advance as this is different depending on your insurance coverage. However, we may be able to find a substitute medication at lower cost or fill out paperwork to get insurance to cover a needed medication.   If a prior authorization is required to get your medication covered by your insurance company, please allow [place] 1-2 business days to complete this process.  Drug prices often vary depending on where the prescription is filled and some pharmacies may offer cheaper prices.  The website www.goodrx.com contains coupons for medications through different pharmacies. The prices here do not account for what the cost may be with help from insurance (it may be cheaper with your insurance), but the website can give you the price if you did not use any insurance.  - You  can print the associated coupon and take it with your prescription to the pharmacy.  - You may also stop by our office during regular business hours and pick up a GoodRx coupon card.  - If you need your prescription sent electronically to a different pharmacy, notify our office through [facility] or by phone at [phone] option 4.     Si Usted Necesita Algo Despus de Su Visita  Tambin puede enviarnos un mensaje a travs de Pharmacist, community. Por lo general respondemos a los  mensajes de MyChart en el transcurso de 1 a 2 das hbiles.  Para renovar recetas, por favor pida a su farmacia que se ponga en contacto con nuestra oficina. Harland Dingwall de fax es [place] [phone].  Si tiene un asunto urgente cuando la clnica est cerrada y que no puede esperar hasta el siguiente da hbil, puede llamar/localizar a su doctor(a) al nmero que aparece a continuacin.   Por favor, tenga en cuenta que aunque hacemos todo lo posible para estar disponibles para asuntos urgentes fuera del horario de [place], no estamos disponibles las 24 horas del da, los 7 das de la [place].   Si tiene un problema urgente y no puede comunicarse con nosotros, puede optar por buscar atencin mdica  en el consultorio de su doctor(a), en una clnica privada, en un centro de atencin urgente o en una sala de emergencias.  Si tiene Engineering geologist, por favor llame inmediatamente al 911 o vaya a la sala de emergencias.  Nmeros de bper  - Dr. Nehemiah Massed: [phone]  - Dra. Moye: [phone]  - Dra. Nicole Kindred: [phone]  En caso de inclemencias del [place], por favor llame a Johnsie Kindred principal al [phone] para una actualizacin sobre el [employer] de cualquier retraso o cierre.  Consejos para la medicacin en dermatologa: Por favor, guarde las cajas en las que vienen los medicamentos de uso tpico para ayudarle a seguir las instrucciones sobre dnde y cmo usarlos. Las farmacias generalmente imprimen las instrucciones del medicamento slo en las cajas y no directamente en los tubos del [place].   Si su medicamento es muy caro, por favor, pngase en contacto con Zigmund Daniel llamando al [phone] y presione la opcin 4 o envenos un mensaje a travs de Pharmacist, community.   No podemos decirle cul ser su copago por los medicamentos por adelantado ya que esto es diferente dependiendo de la cobertura de su seguro. Sin embargo, es posible que podamos encontrar un medicamento sustituto a  Electrical engineer un formulario para que el seguro cubra el medicamento que se considera necesario.   Si se requiere una autorizacin previa para que su compaa de seguros [place] su medicamento, por favor permtanos de 1 a 2 das hbiles para completar este proceso.  Los precios de los medicamentos varan con frecuencia dependiendo del Environmental consultant de dnde se surte la receta y alguna farmacias pueden ofrecer precios ms baratos.  El sitio web www.goodrx.com tiene cupones para medicamentos de Airline pilot. Los precios aqu no tienen en cuenta lo que podra costar con la ayuda del seguro (puede ser ms barato con su seguro), pero el sitio web puede darle el precio si no utiliz Research scientist (physical sciences).  - Puede imprimir el cupn correspondiente y llevarlo con su receta a la farmacia.  - Tambin puede pasar por nuestra oficina durante el horario de atencin regular y Charity fundraiser una tarjeta de cupones de GoodRx.  - Si necesita que su receta se enve electrnicamente a una farmacia diferente, informe a  nuestra oficina a travs de MyChart de [place] o por telfono llamando al [phone] y presione la opcin 4.

## 2021-12-29 DIAGNOSIS — J449 Chronic obstructive pulmonary disease, unspecified: Secondary | ICD-10-CM | POA: Diagnosis not present

## 2021-12-30 DIAGNOSIS — H2512 Age-related nuclear cataract, left eye: Secondary | ICD-10-CM | POA: Diagnosis not present

## 2021-12-30 DIAGNOSIS — H2511 Age-related nuclear cataract, right eye: Secondary | ICD-10-CM | POA: Diagnosis not present

## 2022-01-08 ENCOUNTER — Encounter: Payer: Self-pay | Admitting: Dermatology

## 2022-01-10 DIAGNOSIS — H16142 Punctate keratitis, left eye: Secondary | ICD-10-CM | POA: Diagnosis not present

## 2022-01-26 DIAGNOSIS — H16142 Punctate keratitis, left eye: Secondary | ICD-10-CM | POA: Diagnosis not present

## 2022-01-27 DIAGNOSIS — H2511 Age-related nuclear cataract, right eye: Secondary | ICD-10-CM | POA: Diagnosis not present

## 2022-02-13 DIAGNOSIS — H16141 Punctate keratitis, right eye: Secondary | ICD-10-CM | POA: Diagnosis not present

## 2022-02-17 ENCOUNTER — Encounter: Payer: Self-pay | Admitting: *Deleted

## 2022-02-27 DIAGNOSIS — Z961 Presence of intraocular lens: Secondary | ICD-10-CM | POA: Diagnosis not present

## 2022-03-16 DIAGNOSIS — M5432 Sciatica, left side: Secondary | ICD-10-CM | POA: Diagnosis not present

## 2022-03-16 DIAGNOSIS — M5431 Sciatica, right side: Secondary | ICD-10-CM | POA: Diagnosis not present

## 2022-03-16 DIAGNOSIS — M5136 Other intervertebral disc degeneration, lumbar region: Secondary | ICD-10-CM | POA: Diagnosis not present

## 2022-03-16 DIAGNOSIS — S32599A Other specified fracture of unspecified pubis, initial encounter for closed fracture: Secondary | ICD-10-CM | POA: Diagnosis not present

## 2022-03-16 DIAGNOSIS — M47816 Spondylosis without myelopathy or radiculopathy, lumbar region: Secondary | ICD-10-CM | POA: Diagnosis not present

## 2022-03-16 DIAGNOSIS — S32501A Unspecified fracture of right pubis, initial encounter for closed fracture: Secondary | ICD-10-CM | POA: Diagnosis not present

## 2022-03-24 DIAGNOSIS — M5416 Radiculopathy, lumbar region: Secondary | ICD-10-CM | POA: Diagnosis not present

## 2022-03-28 DIAGNOSIS — M5416 Radiculopathy, lumbar region: Secondary | ICD-10-CM | POA: Diagnosis not present

## 2022-03-31 DIAGNOSIS — M5416 Radiculopathy, lumbar region: Secondary | ICD-10-CM | POA: Diagnosis not present

## 2022-04-03 ENCOUNTER — Encounter: Payer: Self-pay | Admitting: Emergency Medicine

## 2022-04-17 DIAGNOSIS — M549 Dorsalgia, unspecified: Secondary | ICD-10-CM | POA: Diagnosis not present

## 2022-04-20 DIAGNOSIS — H40153 Residual stage of open-angle glaucoma, bilateral: Secondary | ICD-10-CM | POA: Diagnosis not present

## 2022-05-23 DIAGNOSIS — M549 Dorsalgia, unspecified: Secondary | ICD-10-CM | POA: Diagnosis not present

## 2022-06-07 DIAGNOSIS — M549 Dorsalgia, unspecified: Secondary | ICD-10-CM | POA: Diagnosis not present

## 2022-06-14 DIAGNOSIS — M549 Dorsalgia, unspecified: Secondary | ICD-10-CM | POA: Diagnosis not present

## 2022-06-28 DIAGNOSIS — F17210 Nicotine dependence, cigarettes, uncomplicated: Secondary | ICD-10-CM | POA: Diagnosis not present

## 2022-06-28 DIAGNOSIS — J449 Chronic obstructive pulmonary disease, unspecified: Secondary | ICD-10-CM | POA: Diagnosis not present

## 2022-07-04 DIAGNOSIS — M549 Dorsalgia, unspecified: Secondary | ICD-10-CM | POA: Diagnosis not present

## 2022-07-12 ENCOUNTER — Ambulatory Visit (INDEPENDENT_AMBULATORY_CARE_PROVIDER_SITE_OTHER): Payer: PPO | Admitting: Dermatology

## 2022-07-12 VITALS — BP 118/83

## 2022-07-12 DIAGNOSIS — W908XXA Exposure to other nonionizing radiation, initial encounter: Secondary | ICD-10-CM

## 2022-07-12 DIAGNOSIS — L578 Other skin changes due to chronic exposure to nonionizing radiation: Secondary | ICD-10-CM | POA: Diagnosis not present

## 2022-07-12 DIAGNOSIS — L820 Inflamed seborrheic keratosis: Secondary | ICD-10-CM | POA: Diagnosis not present

## 2022-07-12 DIAGNOSIS — X32XXXA Exposure to sunlight, initial encounter: Secondary | ICD-10-CM

## 2022-07-12 DIAGNOSIS — L570 Actinic keratosis: Secondary | ICD-10-CM

## 2022-07-12 NOTE — Patient Instructions (Addendum)
Actinic keratoses are precancerous spots that appear secondary to cumulative UV radiation exposure/sun exposure over time. They are chronic with expected duration over 1 year. A portion of actinic keratoses will progress to squamous cell carcinoma of the skin. It is not possible to reliably predict which spots will progress to skin cancer and so treatment is recommended to prevent development of skin cancer.  Recommend daily broad spectrum sunscreen SPF 30+ to sun-exposed areas, reapply every 2 hours as needed.  Recommend staying in the shade or wearing long sleeves, sun glasses (UVA+UVB protection) and wide brim hats (4-inch brim around the entire circumference of the hat). Call for new or changing lesions.  Prior to procedure, discussed risks of blister formation, small wound, skin dyspigmentation, or rare scar following cryotherapy. Recommend Vaseline ointment to treated areas while healing.   Seborrheic Keratosis  What causes seborrheic keratoses? Seborrheic keratoses are harmless, common skin growths that first appear during adult life.  As time goes by, more growths appear.  Some people may develop a large number of them.  Seborrheic keratoses appear on both covered and uncovered body parts.  They are not caused by sunlight.  The tendency to develop seborrheic keratoses can be inherited.  They vary in color from skin-colored to gray, brown, or even black.  They can be either smooth or have a rough, warty surface.   Seborrheic keratoses are superficial and look as if they were stuck on the skin.  Under the microscope this type of keratosis looks like layers upon layers of skin.  That is why at times the top layer may seem to fall off, but the rest of the growth remains and re-grows.    Treatment Seborrheic keratoses do not need to be treated, but can easily be removed in the office.  Seborrheic keratoses often cause symptoms when they rub on clothing or jewelry.  Lesions can be in the way of  shaving.  If they become inflamed, they can cause itching, soreness, or burning.  Removal of a seborrheic keratosis can be accomplished by freezing, burning, or surgery. If any spot bleeds, scabs, or grows rapidly, please return to have it checked, as these can be an indication of a skin cancer.    Due to recent changes in healthcare laws, you may see results of your pathology and/or laboratory studies on MyChart before the doctors have had a chance to review them. We understand that in some cases there may be results that are confusing or concerning to you. Please understand that not all results are received at the same time and often the doctors may need to interpret multiple results in order to provide you with the best plan of care or course of treatment. Therefore, we ask that you please give [place] 2 business days to thoroughly review all your results before contacting the office for clarification. Should we see a critical lab result, you will be contacted sooner.   If You Need Anything After Your Visit  If you have any questions or concerns for your doctor, please call our main line at [phone] and press option 4 to reach your doctor's medical assistant. If no one answers, please leave a voicemail as directed and we will return your call as soon as possible. Messages left after 4 pm will be answered the following business day.   You may also send [place] a message via MyChart. We typically respond to MyChart messages within 1-2 business days.  For prescription refills, please ask your pharmacy to  contact our office. Our fax number is [phone].  If you have an urgent issue when the clinic is closed that cannot wait until the next business day, you can page your doctor at the number below.    Please note that while we do our best to be available for urgent issues outside of office hours, we are not available 24/7.   If you have an urgent issue and are unable to reach [place], you may choose to  seek medical care at your doctor's office, retail clinic, urgent care center, or emergency room.  If you have a medical emergency, please immediately call 911 or go to the emergency department.  Pager Numbers  - Dr. Gwen Pounds: [phone]  - Dr. Neale Burly: [phone]  - Dr. Roseanne Reno: [phone]  In the event of inclement weather, please call our main line at [phone] for an update on the status of any delays or closures.  Dermatology Medication Tips: Please keep the boxes that topical medications come in in order to help keep track of the instructions about where and how to use these. Pharmacies typically print the medication instructions only on the boxes and not directly on the medication tubes.   If your medication is too expensive, please contact our office at [phone] option 4 or send [place] a message through MyChart.   We are unable to tell what your co-pay for medications will be in advance as this is different depending on your insurance coverage. However, we may be able to find a substitute medication at lower cost or fill out paperwork to get insurance to cover a needed medication.   If a prior authorization is required to get your medication covered by your insurance company, please allow [place] 1-2 business days to complete this process.  Drug prices often vary depending on where the prescription is filled and some pharmacies may offer cheaper prices.  The website www.goodrx.com contains coupons for medications through different pharmacies. The prices here do not account for what the cost may be with help from insurance (it may be cheaper with your insurance), but the website can give you the price if you did not use any insurance.  - You can print the associated coupon and take it with your prescription to the pharmacy.  - You may also stop by our office during regular business hours and pick up a GoodRx coupon card.  - If you need your prescription sent electronically to a  different pharmacy, notify our office through [facility] or by phone at [phone] option 4.     Si Usted Necesita Algo Despus de Su Visita  Tambin puede enviarnos un mensaje a travs de Clinical cytogeneticist. Por lo general respondemos a los mensajes de MyChart en el transcurso de 1 a 2 das hbiles.  Para renovar recetas, por favor pida a su farmacia que se ponga en contacto con nuestra oficina. Annie Sable de fax es [place] [phone].  Si tiene un asunto urgente cuando la clnica est cerrada y que no puede esperar hasta el siguiente da hbil, puede llamar/localizar a su doctor(a) al nmero que aparece a continuacin.   Por favor, tenga en cuenta que aunque hacemos todo lo posible para estar disponibles para asuntos urgentes fuera del horario de [place], no estamos disponibles las 24 horas del da, los 7 [address]  [address] de la [place].   Si tiene un problema urgente y no puede comunicarse con nosotros, puede optar por buscar atencin mdica  en el consultorio de su doctor(a), en una clnica  privada, en un centro de atencin urgente o en una sala de emergencias.  Si tiene Engineer, drilling, por favor llame inmediatamente al 911 o vaya a la sala de emergencias.  Nmeros de bper  - Dr. Gwen Pounds: [phone]  - Dra. Moye: [phone]  - Dra. Roseanne Reno: [phone]  En caso de inclemencias del [place], por favor llame a Lacy Duverney principal al [phone] para una actualizacin sobre el [place] de cualquier retraso o cierre.  Consejos para la medicacin en dermatologa: Por favor, guarde las cajas en las que vienen los medicamentos de uso tpico para ayudarle a seguir las instrucciones sobre dnde y cmo usarlos. Las farmacias generalmente imprimen las instrucciones del medicamento slo en las cajas y no directamente en los tubos del [place].   Si su medicamento es muy caro, por favor, pngase en contacto con Rolm Gala llamando al [phone] y presione la opcin 4 o envenos un  mensaje a travs de Clinical cytogeneticist.   No podemos decirle cul ser su copago por los medicamentos por adelantado ya que esto es diferente dependiendo de la cobertura de su seguro. Sin embargo, es posible que podamos encontrar un medicamento sustituto a Audiological scientist un formulario para que el seguro cubra el medicamento que se considera necesario.   Si se requiere una autorizacin previa para que su compaa de seguros [place] su medicamento, por favor permtanos de 1 a 2 das hbiles para completar [address]  Los precios de los medicamentos varan con frecuencia dependiendo del Environmental consultant de dnde se surte la receta y alguna farmacias pueden ofrecer precios ms baratos.  El sitio web www.goodrx.com tiene cupones para medicamentos de Health and safety inspector. Los precios aqu no tienen en cuenta lo que podra costar con la ayuda del seguro (puede ser ms barato con su seguro), pero el sitio web puede darle el precio si no utiliz Tourist information centre manager.  - Puede imprimir el cupn correspondiente y llevarlo con su receta a la farmacia.  - Tambin puede pasar por nuestra oficina durante el horario de atencin regular y Education officer, museum una tarjeta de cupones de GoodRx.  - Si necesita que su receta se enve electrnicamente a una farmacia diferente, informe a nuestra oficina a travs de MyChart de [place] o por telfono llamando al [phone] y presione la opcin 4.

## 2022-07-12 NOTE — Progress Notes (Signed)
   Follow-Up Visit   Subjective  Shari Gutierrez is a 72 y.o. female who presents for the following: Spot on scalp that she noticed several months ago Patient would like frozen and check Hyperterkeratoic papule cutaneous horn Left crown scalp  isk  Recheck in 3 months  Ak at chest  The patient has spots, moles and lesions to be evaluated, some may be new or changing and the patient may have concern these could be cancer.  The following portions of the chart were reviewed this encounter and updated as appropriate: medications, allergies, medical history  Review of Systems:  No other skin or systemic complaints except as noted in HPI or Assessment and Plan.  Objective  Well appearing patient in no apparent distress; mood and affect are within normal limits. A focused examination was performed of the following areas: Scalp and chest  Relevant exam findings are noted in the Assessment and Plan.  chest x 1 Erythematous thin papules/macules with gritty scale.   left crown scalp Hyperkeratotic papule / cutaneous horn    Assessment & Plan   AK (actinic keratosis) chest x 1  Destruction of lesion - chest x 1 Complexity: simple   Destruction method: cryotherapy   Informed consent: discussed and consent obtained   Timeout:  patient name, date of birth, surgical site, and procedure verified Lesion destroyed using liquid nitrogen: Yes   Region frozen until ice ball extended beyond lesion: Yes   Outcome: patient tolerated procedure well with no complications   Post-procedure details: wound care instructions given    Inflamed seborrheic keratosis left crown scalp  Symptomatic, irritating, patient would like treated.  Destruction of lesion - left crown scalp Complexity: simple   Destruction method: cryotherapy   Informed consent: discussed and consent obtained   Timeout:  patient name, date of birth, surgical site, and procedure verified Lesion destroyed using liquid nitrogen:  Yes   Region frozen until ice ball extended beyond lesion: Yes   Outcome: patient tolerated procedure well with no complications   Post-procedure details: wound care instructions given   Additional details:     ACTINIC DAMAGE - chronic, secondary to cumulative UV radiation exposure/sun exposure over time - diffuse scaly erythematous macules with underlying dyspigmentation - Recommend daily broad spectrum sunscreen SPF 30+ to sun-exposed areas, reapply every 2 hours as needed.  - Recommend staying in the shade or wearing long sleeves, sun glasses (UVA+UVB protection) and wide brim hats (4-inch brim around the entire circumference of the hat). - Call for new or changing lesions.   Return in about 3 months (around 10/12/2022) for recheck ak and isk .  I, Joanie Coddington, CMA, am acting as scribe for Armida Sans, MD .  Documentation: I have reviewed the above documentation for accuracy and completeness, and I agree with the above.  Armida Sans, MD

## 2022-07-14 ENCOUNTER — Encounter: Payer: Self-pay | Admitting: Dermatology

## 2022-08-29 DIAGNOSIS — H40153 Residual stage of open-angle glaucoma, bilateral: Secondary | ICD-10-CM | POA: Diagnosis not present

## 2022-11-01 ENCOUNTER — Ambulatory Visit: Payer: PPO | Admitting: Dermatology

## 2022-12-06 DIAGNOSIS — J449 Chronic obstructive pulmonary disease, unspecified: Secondary | ICD-10-CM | POA: Diagnosis not present

## 2022-12-06 DIAGNOSIS — J42 Unspecified chronic bronchitis: Secondary | ICD-10-CM | POA: Diagnosis not present

## 2022-12-18 ENCOUNTER — Other Ambulatory Visit: Payer: Self-pay

## 2022-12-18 DIAGNOSIS — F17210 Nicotine dependence, cigarettes, uncomplicated: Secondary | ICD-10-CM

## 2022-12-18 DIAGNOSIS — Z122 Encounter for screening for malignant neoplasm of respiratory organs: Secondary | ICD-10-CM

## 2022-12-18 DIAGNOSIS — Z87891 Personal history of nicotine dependence: Secondary | ICD-10-CM

## 2022-12-21 ENCOUNTER — Ambulatory Visit: Payer: PPO | Admitting: Acute Care

## 2022-12-21 DIAGNOSIS — F17210 Nicotine dependence, cigarettes, uncomplicated: Secondary | ICD-10-CM

## 2022-12-21 NOTE — Patient Instructions (Signed)

## 2022-12-21 NOTE — Progress Notes (Signed)
  Virtual Visit via Telephone Note  I connected with Shari Gutierrez on 12/21/22 at  2:30 PM EST by telephone and verified that I am speaking with the correct person using two identifiers.  Location: Patient: in home Provider: [address] [place], [place], [address]    Shared Decision Making Visit Lung Cancer Screening Program [phone])   Eligibility: Age 72 y.o. Pack Years Smoking History Calculation 39 (# packs/per year x # years smoked) Recent History of coughing up blood  no Unexplained weight loss? no ( >Than 15 pounds within the last 6 months ) Prior History Lung / other cancer no (Diagnosis within the last 5 years already requiring surveillance chest CT Scans). Smoking Status Current Smoker Former Smokers: Years since quit: NA  Quit Date: NA  Visit Components: Discussion included one or more decision making aids. yes Discussion included risk/benefits of screening. yes Discussion included potential follow up diagnostic testing for abnormal scans. yes Discussion included meaning and risk of over diagnosis. yes Discussion included meaning and risk of False Positives. yes Discussion included meaning of total radiation exposure. yes  Counseling Included: Importance of adherence to annual lung cancer LDCT screening. yes Impact of comorbidities on ability to participate in the program. yes Ability and willingness to under diagnostic treatment. yes  Smoking Cessation Counseling: Current Smokers:  Discussed importance of smoking cessation. yes Information about tobacco cessation classes and interventions provided to patient. yes Patient provided with "ticket" for LDCT Scan. yes Symptomatic Patient. no  Counseling NA Diagnosis Code: Tobacco Use Z72.0 Asymptomatic Patient yes  Counseling (Intermediate counseling: > three minutes counseling) [id number] Former Smokers:  Discussed the importance of maintaining cigarette abstinence. yes Diagnosis Code: Personal History  of Nicotine Dependence. B28.413 Information about tobacco cessation classes and interventions provided to patient. Yes Patient provided with "ticket" for LDCT Scan. yes Written Order for Lung Cancer Screening with LDCT placed in Epic. Yes (CT Chest Lung Cancer Screening Low Dose W/O CM) [id number] Z12.2-Screening of respiratory organs Z87.891-Personal history of nicotine dependence   Karl Bales, RN 12/21/22

## 2023-01-03 ENCOUNTER — Encounter: Payer: Self-pay | Admitting: Dermatology

## 2023-01-03 ENCOUNTER — Ambulatory Visit: Payer: PPO | Admitting: Dermatology

## 2023-01-03 DIAGNOSIS — W908XXA Exposure to other nonionizing radiation, initial encounter: Secondary | ICD-10-CM

## 2023-01-03 DIAGNOSIS — L821 Other seborrheic keratosis: Secondary | ICD-10-CM | POA: Diagnosis not present

## 2023-01-03 DIAGNOSIS — Z1283 Encounter for screening for malignant neoplasm of skin: Secondary | ICD-10-CM

## 2023-01-03 DIAGNOSIS — Z872 Personal history of diseases of the skin and subcutaneous tissue: Secondary | ICD-10-CM

## 2023-01-03 DIAGNOSIS — D692 Other nonthrombocytopenic purpura: Secondary | ICD-10-CM | POA: Diagnosis not present

## 2023-01-03 DIAGNOSIS — L853 Xerosis cutis: Secondary | ICD-10-CM | POA: Diagnosis not present

## 2023-01-03 DIAGNOSIS — L578 Other skin changes due to chronic exposure to nonionizing radiation: Secondary | ICD-10-CM | POA: Diagnosis not present

## 2023-01-03 DIAGNOSIS — D1801 Hemangioma of skin and subcutaneous tissue: Secondary | ICD-10-CM

## 2023-01-03 DIAGNOSIS — L814 Other melanin hyperpigmentation: Secondary | ICD-10-CM | POA: Diagnosis not present

## 2023-01-03 DIAGNOSIS — D229 Melanocytic nevi, unspecified: Secondary | ICD-10-CM

## 2023-01-03 DIAGNOSIS — L570 Actinic keratosis: Secondary | ICD-10-CM

## 2023-01-03 NOTE — Patient Instructions (Addendum)

## 2023-01-03 NOTE — Progress Notes (Signed)
Follow-Up Visit   Subjective  Shari Gutierrez is a 72 y.o. female who presents for the following: Skin Cancer Screening and Full Body Skin Exam. Hx of AKs. No personal Hx of skin cancer or dysplastic nevi.  The patient presents for Total-Body Skin Exam (TBSE) for skin cancer screening and mole check. The patient has spots, moles and lesions to be evaluated, some may be new or changing and the patient may have concern these could be cancer.    The following portions of the chart were reviewed this encounter and updated as appropriate: medications, allergies, medical history  Review of Systems:  No other skin or systemic complaints except as noted in HPI or Assessment and Plan.  Objective  Well appearing patient in no apparent distress; mood and affect are within normal limits.  A full examination was performed including scalp, head, eyes, ears, nose, lips, neck, chest, axillae, abdomen, back, buttocks, bilateral upper extremities, bilateral lower extremities, hands, feet, fingers, toes, fingernails, and toenails. All findings within normal limits unless otherwise noted below.   Relevant physical exam findings are noted in the Assessment and Plan.  Left Lateral Nasal Bridge x1, R upper cutaneous lip x1, L ear x2 (4) Erythematous thin papules/macules with gritty scale.     Assessment & Plan   SKIN CANCER SCREENING PERFORMED TODAY.  ACTINIC DAMAGE - Chronic condition, secondary to cumulative UV/sun exposure - diffuse scaly erythematous macules with underlying dyspigmentation - Recommend daily broad spectrum sunscreen SPF 30+ to sun-exposed areas, reapply every 2 hours as needed.  - Staying in the shade or wearing long sleeves, sun glasses (UVA+UVB protection) and wide brim hats (4-inch brim around the entire circumference of the hat) are also recommended for sun protection.  - Call for new or changing lesions.  LENTIGINES, SEBORRHEIC KERATOSES, HEMANGIOMAS - Benign normal skin  lesions - Benign-appearing - Call for any changes  MELANOCYTIC NEVI - Tan-brown and/or pink-flesh-colored symmetric macules and papules - Benign appearing on exam today - Observation - Call clinic for new or changing moles - Recommend daily use of broad spectrum spf 30+ sunscreen to sun-exposed areas.   Purpura - Chronic; persistent and recurrent.  Treatable, but not curable. - Violaceous macules and patches at arms - Benign - Related to trauma, age, sun damage and/or use of blood thinners, chronic use of topical and/or oral steroids - Observe - Can use OTC arnica containing moisturizer such as Dermend Bruise Formula if desired - Call for worsening or other concerns  Xerosis - diffuse xerotic patches at feet - recommend gentle, hydrating skin care - gentle skin care handout given Recommend Eucerin or CeraVe creams   AK (actinic keratosis) (4) Left Lateral Nasal Bridge x1, R upper cutaneous lip x1, L ear x2  Actinic keratoses are precancerous spots that appear secondary to cumulative UV radiation exposure/sun exposure over time. They are chronic with expected duration over 1 year. A portion of actinic keratoses will progress to squamous cell carcinoma of the skin. It is not possible to reliably predict which spots will progress to skin cancer and so treatment is recommended to prevent development of skin cancer.  Recommend daily broad spectrum sunscreen SPF 30+ to sun-exposed areas, reapply every 2 hours as needed.  Recommend staying in the shade or wearing long sleeves, sun glasses (UVA+UVB protection) and wide brim hats (4-inch brim around the entire circumference of the hat). Call for new or changing lesions.  Destruction of lesion - Left Lateral Nasal Bridge x1, R upper cutaneous  lip x1, L ear x2 (4) Complexity: simple   Destruction method: cryotherapy   Informed consent: discussed and consent obtained   Timeout:  patient name, date of birth, surgical site, and procedure  verified Lesion destroyed using liquid nitrogen: Yes   Region frozen until ice ball extended beyond lesion: Yes   Outcome: patient tolerated procedure well with no complications   Post-procedure details: wound care instructions given   Additional details:  Prior to procedure, discussed risks of blister formation, small wound, skin dyspigmentation, or rare scar following cryotherapy. Recommend Vaseline ointment to treated areas while healing.    Return in about 1 year (around 01/03/2024) for TBSE, HxAKs.  I, Lawson Radar, CMA, am acting as scribe for Armida Sans, MD.   Documentation: I have reviewed the above documentation for accuracy and completeness, and I agree with the above.  Armida Sans, MD

## 2023-01-04 ENCOUNTER — Ambulatory Visit
Admission: RE | Admit: 2023-01-04 | Discharge: 2023-01-04 | Disposition: A | Discharge status: Home / Self Care | Payer: PPO | Source: Ambulatory Visit | Attending: Acute Care | Admitting: Acute Care

## 2023-01-04 DIAGNOSIS — Z87891 Personal history of nicotine dependence: Secondary | ICD-10-CM | POA: Diagnosis not present

## 2023-01-04 DIAGNOSIS — Z122 Encounter for screening for malignant neoplasm of respiratory organs: Secondary | ICD-10-CM

## 2023-01-04 DIAGNOSIS — F17210 Nicotine dependence, cigarettes, uncomplicated: Secondary | ICD-10-CM

## 2023-01-09 ENCOUNTER — Other Ambulatory Visit: Payer: Self-pay | Admitting: Emergency Medicine

## 2023-01-09 DIAGNOSIS — Z1231 Encounter for screening mammogram for malignant neoplasm of breast: Secondary | ICD-10-CM

## 2023-01-19 ENCOUNTER — Telehealth: Payer: Self-pay | Admitting: Acute Care

## 2023-01-19 DIAGNOSIS — R911 Solitary pulmonary nodule: Secondary | ICD-10-CM

## 2023-01-19 NOTE — Telephone Encounter (Signed)
Call Report  

## 2023-01-19 NOTE — Telephone Encounter (Signed)
I have called the patient with the results of her low-dose screening CT.  Her scan was read as a lung RADS 4B, suspicious. 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 As we have no CT imaging to compare plan will be for a PET scan. I will have a follow-up call with the patient after the PET scan is done to review the results If there is PET avid activity on the PET scan we will refer back to Dr. Karna Christmas for biopsy if he feels that is clinically indicated. Dr. Karna Christmas , including new as an FYI. If PET scan is abnormal we will refer her back to you to be seen.   Jonita Albee, and [place] please fax results to PCP and to Dr. Karna Christmas, please explain that plan is for a PET scan followed by virtual visit to review results and make referrals appropriately.  Thanks everyone

## 2023-01-22 NOTE — Telephone Encounter (Signed)
Results/ plans faxed to Dr Karna Christmas. No PCP listed for patient.

## 2023-01-29 ENCOUNTER — Ambulatory Visit
Admission: RE | Admit: 2023-01-29 | Discharge: 2023-01-29 | Disposition: A | Discharge status: Home / Self Care | Payer: PPO | Source: Ambulatory Visit | Attending: Acute Care | Admitting: Acute Care

## 2023-01-29 VITALS — Wt 103.0 lb

## 2023-01-29 DIAGNOSIS — C3411 Malignant neoplasm of upper lobe, right bronchus or lung: Secondary | ICD-10-CM | POA: Diagnosis not present

## 2023-01-29 DIAGNOSIS — R911 Solitary pulmonary nodule: Secondary | ICD-10-CM | POA: Diagnosis not present

## 2023-01-29 LAB — GLUCOSE, CAPILLARY: Glucose-Capillary: 86 mg/dL (ref 70–99)

## 2023-01-29 MED ORDER — FLUDEOXYGLUCOSE F - 18 (FDG) INJECTION
5.6200 | Freq: Once | INTRAVENOUS | Status: AC | PRN
  Administered 2023-01-29: 5.62 via INTRAVENOUS

## 2023-02-13 ENCOUNTER — Ambulatory Visit
Admission: RE | Admit: 2023-02-13 | Discharge: 2023-02-13 | Disposition: A | Discharge status: Home / Self Care | Payer: PPO | Source: Ambulatory Visit | Attending: Radiation Oncology | Admitting: Radiation Oncology

## 2023-02-13 ENCOUNTER — Encounter: Payer: Self-pay | Admitting: Radiation Oncology

## 2023-02-13 VITALS — BP 164/79 | HR 84 | Temp 97.8°F | Resp 14 | Ht 63.0 in | Wt 106.6 lb

## 2023-02-13 DIAGNOSIS — I10 Essential (primary) hypertension: Secondary | ICD-10-CM | POA: Diagnosis not present

## 2023-02-13 DIAGNOSIS — K219 Gastro-esophageal reflux disease without esophagitis: Secondary | ICD-10-CM | POA: Diagnosis not present

## 2023-02-13 DIAGNOSIS — E785 Hyperlipidemia, unspecified: Secondary | ICD-10-CM | POA: Insufficient documentation

## 2023-02-13 DIAGNOSIS — Z7951 Long term (current) use of inhaled steroids: Secondary | ICD-10-CM | POA: Insufficient documentation

## 2023-02-13 DIAGNOSIS — F17210 Nicotine dependence, cigarettes, uncomplicated: Secondary | ICD-10-CM | POA: Diagnosis not present

## 2023-02-13 DIAGNOSIS — R911 Solitary pulmonary nodule: Secondary | ICD-10-CM | POA: Diagnosis not present

## 2023-02-13 DIAGNOSIS — Z79899 Other long term (current) drug therapy: Secondary | ICD-10-CM | POA: Diagnosis not present

## 2023-02-13 DIAGNOSIS — J449 Chronic obstructive pulmonary disease, unspecified: Secondary | ICD-10-CM | POA: Diagnosis not present

## 2023-02-13 NOTE — Consult Note (Signed)
 NEW PATIENT EVALUATION  Name: Shari Gutierrez  MRN: [id number]  Date:   02/13/2023     DOB: 1950/06/02   This 73 y.o. female patient presents to the clinic for initial evaluation of possible stage I non-small cell lung cancer of the right lung apices and patient with multiple Medical comorbidities.  REFERRING PHYSICIAN: Parris Manna, MD  CHIEF COMPLAINT:  Chief Complaint  Patient presents with   Lung Cancer    DIAGNOSIS: The encounter diagnosis was Solitary lung nodule.   PREVIOUS INVESTIGATIONS:  CT scan PET CT scan reviewed Clinical notes reviewed Labs reviewed  HPI: Patient is a 73 year old female followed by pulmonology for COPD.  She is a chronic smoker.  Patient is currently on Spiriva  and albuterol  inhalers.  Recent workup including a CT scan showed a 1.6 cm thick walled 8 typical pulmonary cyst in the apical segment of the right upper lobe possibly due to scarring.  Malignancy would look similar.  She went on to have a PET CT scan showing low hypermetabolic activity in the apical lesion.  Patient is nonsurgical candidate and not thought to be a candidate for navigational bronchoscopy based on location of the tumor.  She was referred to radiation oncology for consideration of treatment she is fairly asymptomatic has a occasional clear cough no hemoptysis or recent exacerbation of her COPD.  PLANNED TREATMENT REGIMEN: Repeat scan in 3 months  PAST MEDICAL HISTORY:  has a past medical history of Anxiety, COPD (chronic obstructive pulmonary disease) (HCC), Female dyspareunia, Gall stones, Hematuria, Hyperlipidemia, Hypertension, Reflux, Sinusitis, and Vitamin D  deficiency.    PAST SURGICAL HISTORY:  Past Surgical History:  Procedure Laterality Date   APPENDECTOMY  1989   CHOLECYSTECTOMY  02/26/2019   COLONOSCOPY  2016   OOPHORECTOMY  1989    FAMILY HISTORY: family history includes Cancer in her father; Kidney failure in her mother.  SOCIAL HISTORY:  reports that she  has been smoking cigarettes. She started smoking about 43 years ago. She has a 43.5 pack-year smoking history. She has never used smokeless tobacco. She reports current alcohol use. She reports that she does not use drugs.  ALLERGIES: Patient has no known allergies.  MEDICATIONS:  Current Outpatient Medications  Medication Sig Dispense Refill   albuterol  (VENTOLIN  HFA) 108 (90 Base) MCG/ACT inhaler Inhale 1-2 puffs into the lungs every 6 (six) hours as needed for wheezing or shortness of breath. 18 g 3   ALPRAZolam  (XANAX ) 0.25 MG tablet Take 1 tablet (0.25 mg total) by mouth at bedtime as needed for anxiety. 30 tablet 2   budesonide -formoterol  (SYMBICORT ) 160-4.5 MCG/ACT inhaler Take  2 puffs by po twice  A day 1 each 3   cholecalciferol (VITAMIN D ) 1000 UNITS tablet Take 1,000 Units by mouth daily.      fenofibrate  160 MG tablet TAKE 1 TABLET BY MOUTH EVERY DAY 90 tablet 1   latanoprost (XALATAN) 0.005 % ophthalmic solution Place 1 drop into both eyes at bedtime.   4   meclizine  (ANTIVERT ) 25 MG tablet TAKE 1 TABLET (25 MG TOTAL) BY MOUTH 2 (TWO) TIMES DAILY AS NEEDED FOR DIZZINESS. 30 tablet 0   metoprolol  tartrate (LOPRESSOR ) 25 MG tablet TAKE 1 TABLET BY MOUTH TWICE A DAY 30 tablet 0   Multiple Vitamin (MULTIVITAMIN WITH MINERALS) TABS tablet Take 1 tablet by mouth 3 (three) times a week.     Tiotropium Bromide  Monohydrate (SPIRIVA  RESPIMAT) 2.5 MCG/ACT AERS Inhale 2 puffs into the lungs daily. 4 g 5  budesonide -formoterol  (SYMBICORT ) 80-4.5 MCG/ACT inhaler *Use as directe (Patient not taking: Reported on 02/13/2023)     conjugated estrogens  (PREMARIN ) vaginal cream Place 0.25 Applicatorfuls vaginally 2 (two) times a week. (Patient not taking: Reported on 02/13/2023) 42.5 g 6   traMADol  (ULTRAM ) 50 MG tablet Take 1 tablet (50 mg total) by mouth every 8 (eight) hours as needed. (Patient not taking: Reported on 02/13/2023) 15 tablet 0   triamcinolone  cream (KENALOG ) 0.1 % Apply 1 application  topically as directed. Qd to bid up to 5 days a week to itchy rash on back and chest until clear, then prn flares, avoid face, groin, axilla (Patient not taking: Reported on 02/13/2023) 80 g 1   No current facility-administered medications for this encounter.    ECOG PERFORMANCE STATUS:  0 - Asymptomatic  REVIEW OF SYSTEMS: Patient denies any weight loss, fatigue, weakness, fever, chills or night sweats. Patient denies any loss of vision, blurred vision. Patient denies any ringing  of the ears or hearing loss. No irregular heartbeat. Patient denies heart murmur or history of fainting. Patient denies any chest pain or pain radiating to her upper extremities. Patient denies any shortness of breath, difficulty breathing at night, cough or hemoptysis. Patient denies any swelling in the lower legs. Patient denies any nausea vomiting, vomiting of blood, or coffee ground material in the vomitus. Patient denies any stomach pain. Patient states has had normal bowel movements no significant constipation or diarrhea. Patient denies any dysuria, hematuria or significant nocturia. Patient denies any problems walking, swelling in the joints or loss of balance. Patient denies any skin changes, loss of hair or loss of weight. Patient denies any excessive worrying or anxiety or significant depression. Patient denies any problems with insomnia. Patient denies excessive thirst, polyuria, polydipsia. Patient denies any swollen glands, patient denies easy bruising or easy bleeding. Patient denies any recent infections, allergies or URI. Patient s visual fields have not changed significantly in recent time.   PHYSICAL EXAM: BP (!) 164/79   Pulse 84   Temp 97.8 F (36.6 C) (Tympanic)   Resp 14   Ht 5' 3 (1.6 m)   Wt 106 lb 9.6 oz (48.4 kg)   BMI 18.88 kg/m  Well-developed well-nourished patient in NAD. HEENT reveals PERLA, EOMI, discs not visualized.  Oral cavity is clear. No oral mucosal lesions are identified. Neck  is clear without evidence of cervical or supraclavicular adenopathy. Lungs are clear to A&P. Cardiac examination is essentially unremarkable with regular rate and rhythm without murmur rub or thrill. Abdomen is benign with no organomegaly or masses noted. Motor sensory and DTR levels are equal and symmetric in the upper and lower extremities. Cranial nerves II through XII are grossly intact. Proprioception is intact. No peripheral adenopathy or edema is identified. No motor or sensory levels are noted. Crude visual fields are within normal range.  LABORATORY DATA: Labs reviewed    RADIOLOGY RESULTS: CT scan and PET CT scan reviewed compatible with above-stated findings PET CT scan shows no evidence of mediastinal or hilar hypermetabolic activity.   IMPRESSION: Possible stage I non-small cell lung cancer of the right lung apices in 73 year old female with marked COPD emphysema  PLAN: At this time I would like to wait another 3 months repeat her CT scan to ascertain whether this is actually a progressive low-grade adenocarcinoma.  If it does progress would offer SBRT treatment.  Risks and benefits of SBRT radiation therapy including extremely low side effect profile possibly fatigue and development of  a slight cough all were discussed in detail with the patient and her husband.  I have ordered a CT scan of 3 months and follow-up evaluation shortly thereafter.  Patient comprehends my recommendations well.  I would like to take this opportunity to thank you for allowing me to participate in the care of your patient.SABRA Marcey Penton, MD

## 2023-02-22 IMAGING — MG MM DIGITAL SCREENING BILAT W/ TOMO AND CAD
8 series · 9 of 24 positions shown · non-contrast
Comparison: Previous exam(s).

CLINICAL DATA: Screening.

EXAM:
DIGITAL SCREENING BILATERAL MAMMOGRAM WITH TOMOSYNTHESIS AND CAD
TECHNIQUE: Bilateral screening digital craniocaudal and mediolateral oblique
mammograms were obtained. Bilateral screening digital breast
tomosynthesis was performed. The images were evaluated with
computer-aided detection.

[L CC synth-2D]
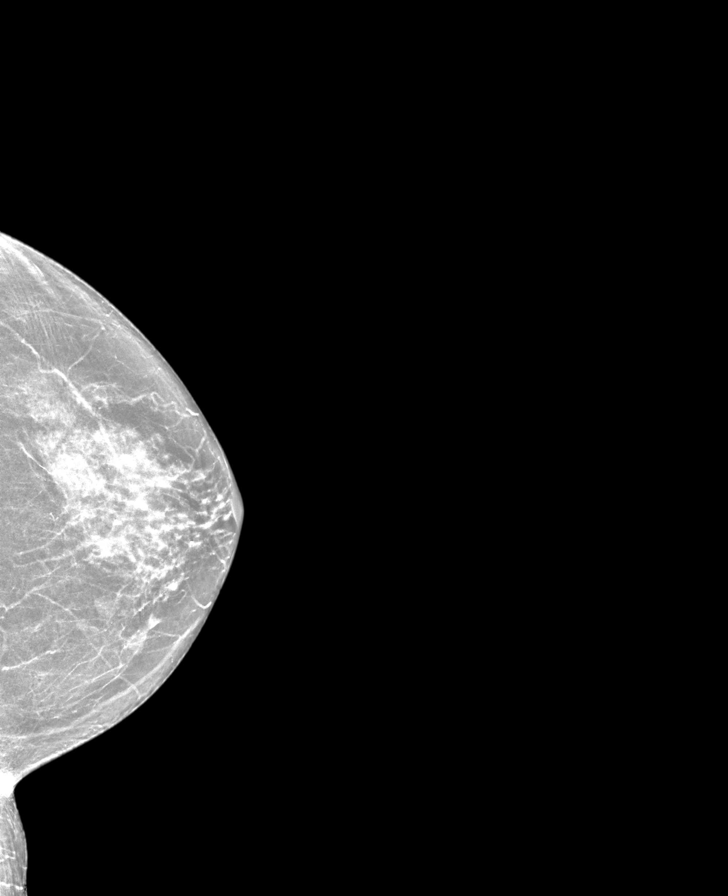

[R CC synth-2D]
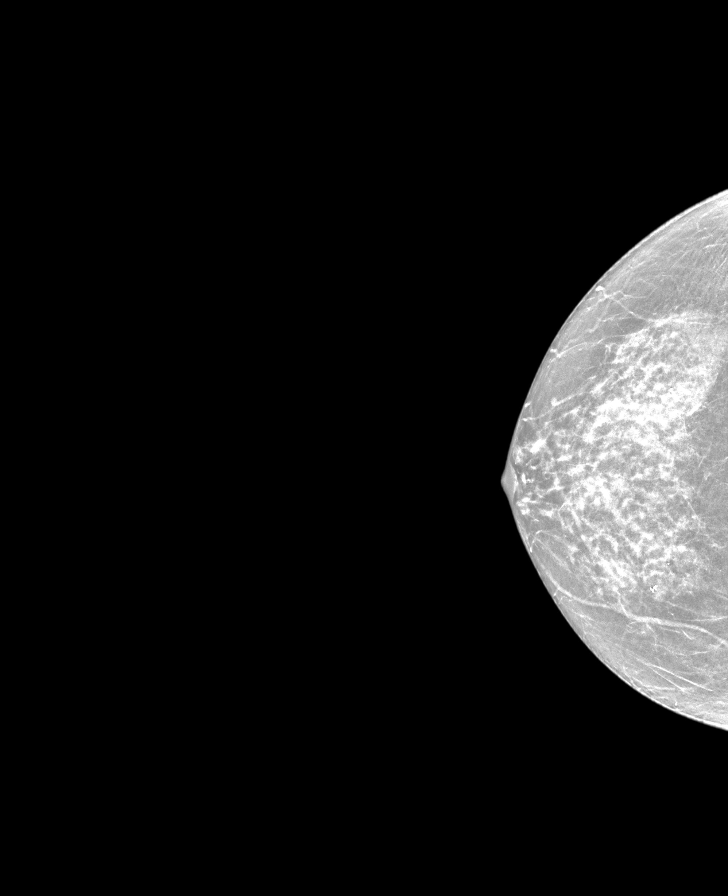

[L MLO synth-2D]
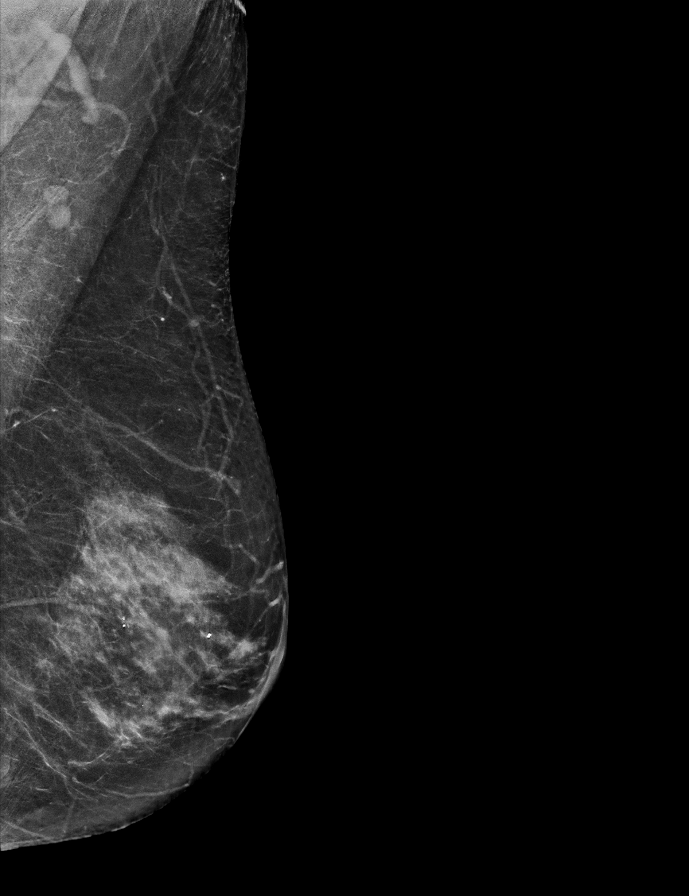

[R MLO synth-2D]
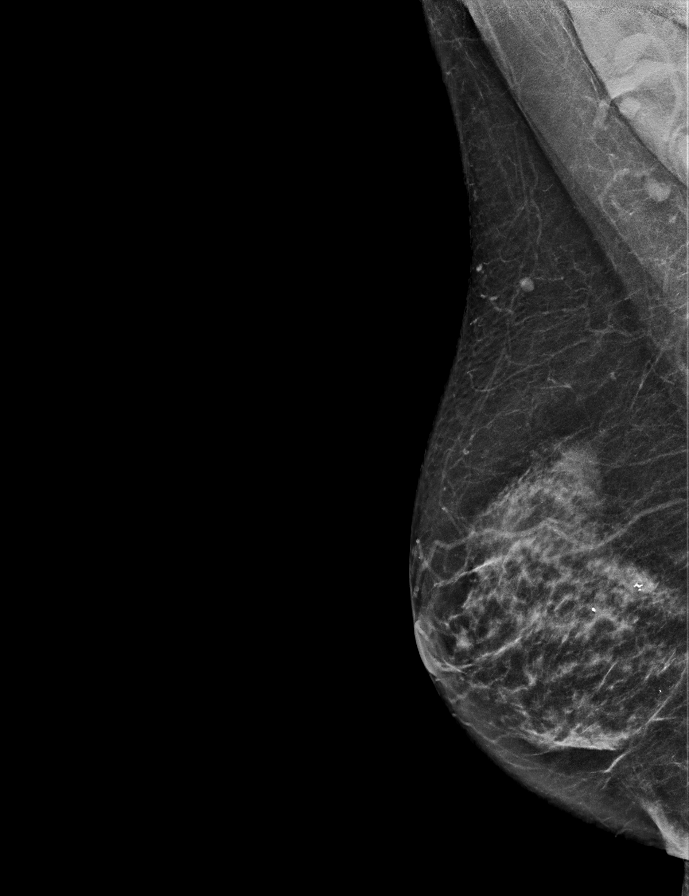

[L CC tomo · 2 of 55 frames shown]
[frame 18/55]
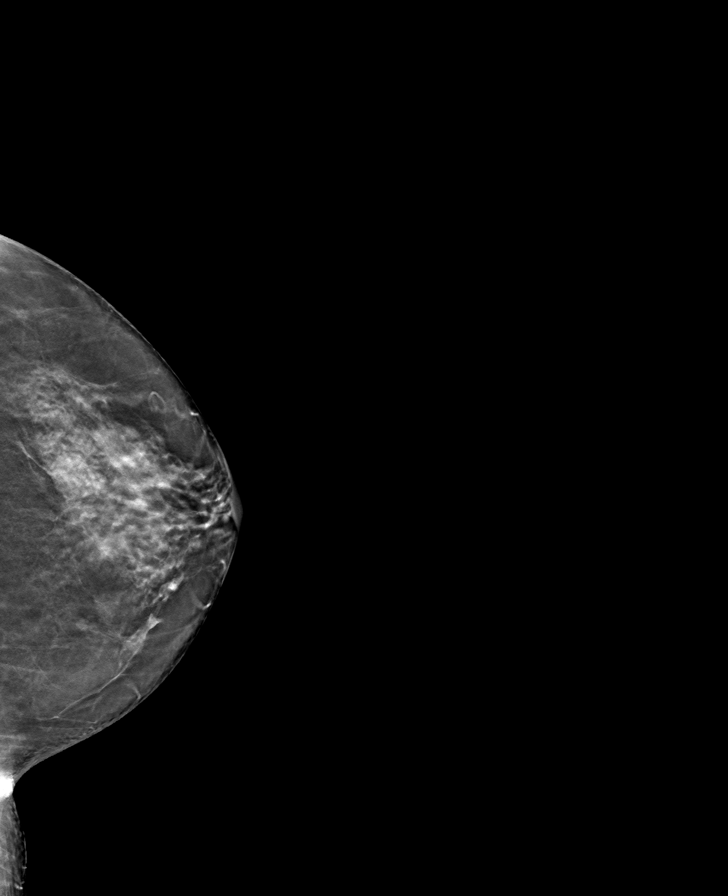
[frame 28/55]
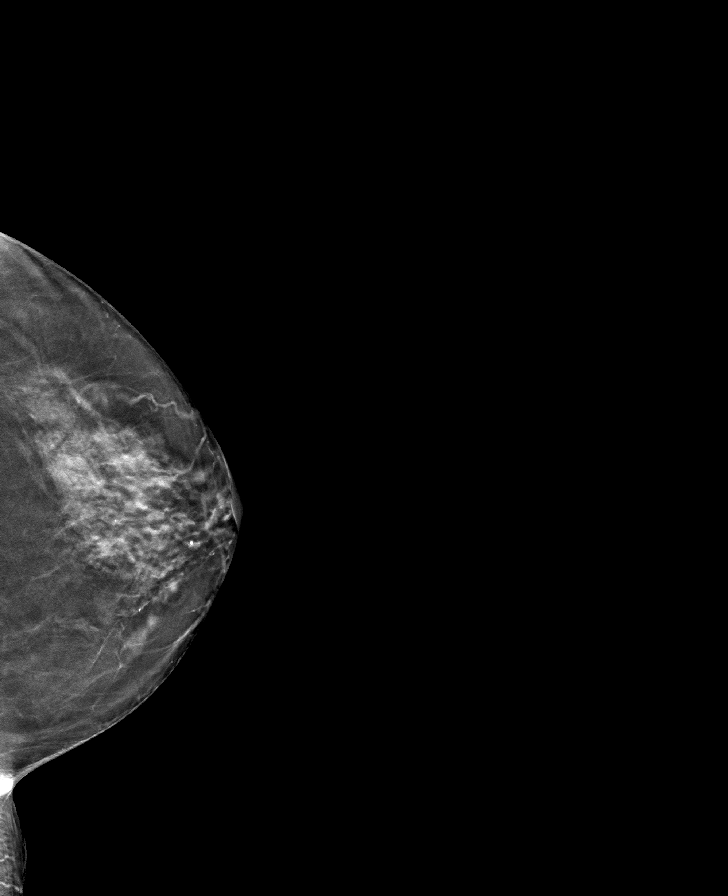

[R CC tomo · tomo slice 25/50.0]
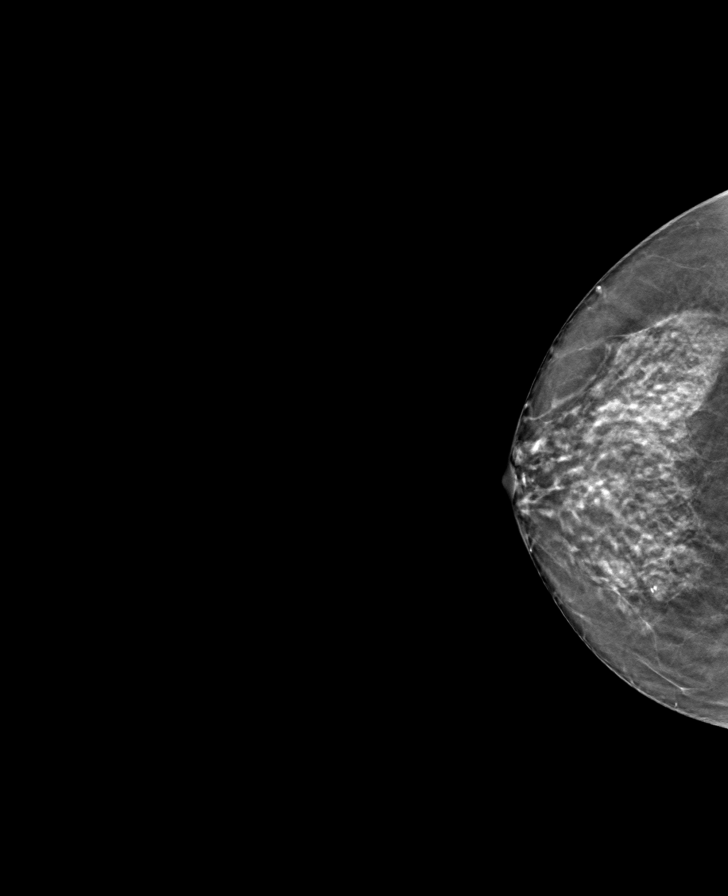

[R MLO tomo · tomo slice 31/60.0]
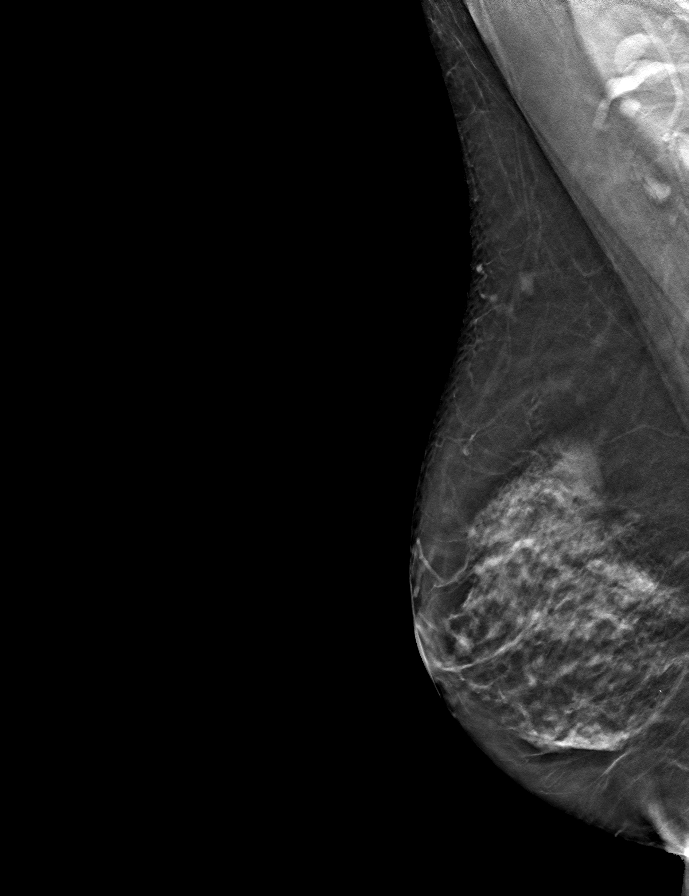

[L MLO tomo · tomo slice 30/59.0]
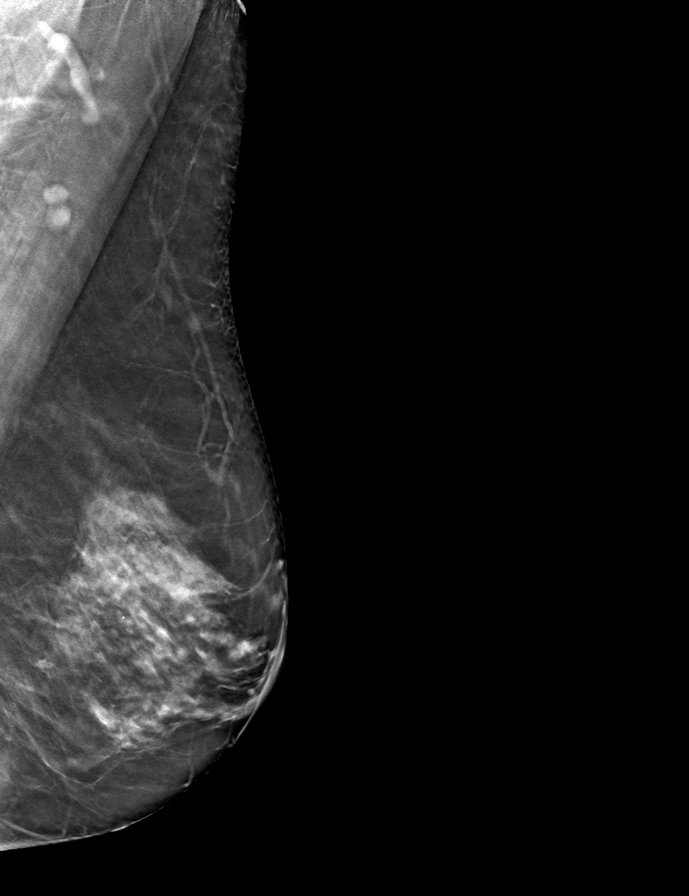

[9 of 24 positions shown; findings below may reference images not displayed]

ACR Breast Density Category c: The breast tissue is heterogeneously
dense, which may obscure small masses.
FINDINGS: There are no findings suspicious for malignancy.
IMPRESSION: No mammographic evidence of malignancy. A result letter of this
screening mammogram will be mailed directly to the patient.

RECOMMENDATION:
Screening mammogram in one year. (Code:[id number])

BI-RADS CATEGORY  1: Negative.

## 2023-05-01 DIAGNOSIS — H40153 Residual stage of open-angle glaucoma, bilateral: Secondary | ICD-10-CM | POA: Diagnosis not present

## 2023-05-03 NOTE — Progress Notes (Signed)
error 

## 2023-05-07 ENCOUNTER — Ambulatory Visit
Admission: RE | Admit: 2023-05-07 | Discharge: 2023-05-07 | Disposition: A | Discharge status: Home / Self Care | Source: Ambulatory Visit | Attending: Emergency Medicine | Admitting: Emergency Medicine

## 2023-05-07 ENCOUNTER — Ambulatory Visit
Admission: RE | Admit: 2023-05-07 | Discharge: 2023-05-07 | Disposition: A | Discharge status: Home / Self Care | Payer: PPO | Source: Ambulatory Visit | Attending: Radiation Oncology | Admitting: Radiation Oncology

## 2023-05-07 DIAGNOSIS — R911 Solitary pulmonary nodule: Secondary | ICD-10-CM | POA: Diagnosis not present

## 2023-05-07 DIAGNOSIS — Z1231 Encounter for screening mammogram for malignant neoplasm of breast: Secondary | ICD-10-CM | POA: Insufficient documentation

## 2023-05-07 DIAGNOSIS — J439 Emphysema, unspecified: Secondary | ICD-10-CM | POA: Diagnosis not present

## 2023-05-07 DIAGNOSIS — J929 Pleural plaque without asbestos: Secondary | ICD-10-CM | POA: Diagnosis not present

## 2023-05-07 DIAGNOSIS — J479 Bronchiectasis, uncomplicated: Secondary | ICD-10-CM | POA: Diagnosis not present

## 2023-05-07 MED ORDER — IOHEXOL 300 MG/ML  SOLN
75.0000 mL | Freq: Once | INTRAMUSCULAR | Status: AC | PRN
  Administered 2023-05-07: 60 mL via INTRAVENOUS

## 2023-05-16 ENCOUNTER — Ambulatory Visit
Admission: RE | Admit: 2023-05-16 | Discharge: 2023-05-16 | Disposition: A | Discharge status: Home / Self Care | Payer: PPO | Source: Ambulatory Visit | Attending: Radiation Oncology | Admitting: Radiation Oncology

## 2023-05-16 ENCOUNTER — Encounter: Payer: Self-pay | Admitting: Radiation Oncology

## 2023-05-16 VITALS — BP 177/69 | HR 90 | Temp 97.6°F | Resp 16 | Wt 106.0 lb

## 2023-05-16 DIAGNOSIS — R911 Solitary pulmonary nodule: Secondary | ICD-10-CM | POA: Diagnosis not present

## 2023-05-16 DIAGNOSIS — C3411 Malignant neoplasm of upper lobe, right bronchus or lung: Secondary | ICD-10-CM | POA: Insufficient documentation

## 2023-05-16 DIAGNOSIS — F17210 Nicotine dependence, cigarettes, uncomplicated: Secondary | ICD-10-CM | POA: Diagnosis not present

## 2023-05-16 DIAGNOSIS — Z510 Encounter for antineoplastic radiation therapy: Secondary | ICD-10-CM | POA: Insufficient documentation

## 2023-05-16 NOTE — Progress Notes (Signed)
 Radiation Oncology Follow up Note  Name: Shari Gutierrez   Date:   05/16/2023 MRN:  [id number] DOB: 06-10-1950    This 73 y.o. female presents to the clinic today for 47-month follow-up tracking a possible stage I non-small cell lung cancer of the right lung apices and patient with multiple medical comorbidities prohibiting biopsy.  REFERRING PROVIDER: Shayne Demark, MD  HPI: Patient is a 73 year old female I first consulted 3 months ago for a probable stage I non-small cell lung cancer of the right lung apices.  At that time CT scan showed a 1.6 cm thick-walled atypical pulmonary cyst in the apical segment the right upper lobe.  PET scan showed low hypermetabolic activity in the apical lesion.  She was a nonsurgical candidate according to pulmonology based on the location of tumor.  I ordered 76-month follow-up with repeat CT scan.  Which was read as stable pleural thickening in the right lung apices with an atypical pulmonary cystic lesion.  Again tumor has shown low-level uptake on recent PET scan.  Tumor had increased in size and now has a much more nodular component to the cystic area.  She remains asymptomatic.  COMPLICATIONS OF TREATMENT: none  FOLLOW UP COMPLIANCE: keeps appointments   PHYSICAL EXAM:  BP (!) 177/69   Pulse 90   Temp 97.6 F (36.4 C) (Tympanic)   Resp 16   Wt 106 lb (48.1 kg)   BMI 18.78 kg/m  Well-developed well-nourished patient in NAD. HEENT reveals PERLA, EOMI, discs not visualized.  Oral cavity is clear. No oral mucosal lesions are identified. Neck is clear without evidence of cervical or supraclavicular adenopathy. Lungs are clear to A&P. Cardiac examination is essentially unremarkable with regular rate and rhythm without murmur rub or thrill. Abdomen is benign with no organomegaly or masses noted. Motor sensory and DTR levels are equal and symmetric in the upper and lower extremities. Cranial nerves II through XII are grossly intact. Proprioception is  intact. No peripheral adenopathy or edema is identified. No motor or sensory levels are noted. Crude visual fields are within normal range.  RADIOLOGY RESULTS: CT scans prior PET and CT scans all reviewed compatible with above-stated findings.  PLAN: At this time of my review there is increased growth of this lesion with more nodular component of the cystic wall which is certainly worrisome for a low-grade adenocarcinoma.  At this time I have recommended SBRT 60 Gray in 5 fractions to this area I base my recommendations on treating based on the progression in the nodular component of the cystic lesion as well as slight increase in size.  Risks and benefits of SBRT including extremely low side effect profile especially in an apical part of the lung was reviewed with the patient.  She comprehends my recommendations well.  I personally set up and ordered CT simulation for next week.  I would like to take this opportunity to thank you for allowing me to participate in the care of your patient.Glenis Langdon, MD

## 2023-05-22 ENCOUNTER — Ambulatory Visit
Admission: RE | Admit: 2023-05-22 | Discharge: 2023-05-22 | Disposition: A | Discharge status: Home / Self Care | Source: Ambulatory Visit | Attending: Radiation Oncology | Admitting: Radiation Oncology

## 2023-05-22 DIAGNOSIS — Z510 Encounter for antineoplastic radiation therapy: Secondary | ICD-10-CM | POA: Diagnosis not present

## 2023-05-22 DIAGNOSIS — F17210 Nicotine dependence, cigarettes, uncomplicated: Secondary | ICD-10-CM | POA: Diagnosis not present

## 2023-05-22 DIAGNOSIS — C3411 Malignant neoplasm of upper lobe, right bronchus or lung: Secondary | ICD-10-CM | POA: Diagnosis not present

## 2023-05-23 DIAGNOSIS — F17210 Nicotine dependence, cigarettes, uncomplicated: Secondary | ICD-10-CM | POA: Diagnosis not present

## 2023-05-23 DIAGNOSIS — Z510 Encounter for antineoplastic radiation therapy: Secondary | ICD-10-CM | POA: Diagnosis not present

## 2023-05-23 DIAGNOSIS — C3411 Malignant neoplasm of upper lobe, right bronchus or lung: Secondary | ICD-10-CM | POA: Diagnosis not present

## 2023-05-28 DIAGNOSIS — J449 Chronic obstructive pulmonary disease, unspecified: Secondary | ICD-10-CM | POA: Diagnosis not present

## 2023-06-04 DIAGNOSIS — J449 Chronic obstructive pulmonary disease, unspecified: Secondary | ICD-10-CM | POA: Diagnosis not present

## 2023-06-04 DIAGNOSIS — J411 Mucopurulent chronic bronchitis: Secondary | ICD-10-CM | POA: Diagnosis not present

## 2023-06-04 DIAGNOSIS — R911 Solitary pulmonary nodule: Secondary | ICD-10-CM | POA: Diagnosis not present

## 2023-06-04 DIAGNOSIS — R0609 Other forms of dyspnea: Secondary | ICD-10-CM | POA: Diagnosis not present

## 2023-06-05 ENCOUNTER — Other Ambulatory Visit: Payer: Self-pay

## 2023-06-05 ENCOUNTER — Ambulatory Visit
Admission: RE | Admit: 2023-06-05 | Discharge: 2023-06-05 | Disposition: A | Discharge status: Home / Self Care | Source: Ambulatory Visit | Attending: Radiation Oncology | Admitting: Radiation Oncology

## 2023-06-05 DIAGNOSIS — Z510 Encounter for antineoplastic radiation therapy: Secondary | ICD-10-CM | POA: Diagnosis not present

## 2023-06-05 DIAGNOSIS — C3411 Malignant neoplasm of upper lobe, right bronchus or lung: Secondary | ICD-10-CM | POA: Diagnosis not present

## 2023-06-05 DIAGNOSIS — R911 Solitary pulmonary nodule: Secondary | ICD-10-CM | POA: Diagnosis not present

## 2023-06-05 LAB — RAD ONC ARIA SESSION SUMMARY
Course Elapsed Days: 0
Plan Fractions Treated to Date: 1
Plan Prescribed Dose Per Fraction: 12 Gy
Plan Total Fractions Prescribed: 5
Plan Total Prescribed Dose: 60 Gy
Reference Point Dosage Given to Date: 12 Gy
Reference Point Session Dosage Given: 12 Gy
Session Number: 1

## 2023-06-07 ENCOUNTER — Ambulatory Visit
Admission: RE | Admit: 2023-06-07 | Discharge: 2023-06-07 | Disposition: A | Discharge status: Home / Self Care | Source: Ambulatory Visit | Attending: Radiation Oncology | Admitting: Radiation Oncology

## 2023-06-07 ENCOUNTER — Other Ambulatory Visit: Payer: Self-pay

## 2023-06-07 DIAGNOSIS — Z510 Encounter for antineoplastic radiation therapy: Secondary | ICD-10-CM | POA: Diagnosis not present

## 2023-06-07 LAB — RAD ONC ARIA SESSION SUMMARY
Course Elapsed Days: 2
Plan Fractions Treated to Date: 2
Plan Prescribed Dose Per Fraction: 12 Gy
Plan Total Fractions Prescribed: 5
Plan Total Prescribed Dose: 60 Gy
Reference Point Dosage Given to Date: 24 Gy
Reference Point Session Dosage Given: 12 Gy
Session Number: 2

## 2023-06-12 ENCOUNTER — Ambulatory Visit
Admission: RE | Admit: 2023-06-12 | Discharge: 2023-06-12 | Disposition: A | Discharge status: Home / Self Care | Source: Ambulatory Visit | Attending: Radiation Oncology | Admitting: Radiation Oncology

## 2023-06-12 ENCOUNTER — Other Ambulatory Visit: Payer: Self-pay

## 2023-06-12 DIAGNOSIS — Z510 Encounter for antineoplastic radiation therapy: Secondary | ICD-10-CM | POA: Diagnosis not present

## 2023-06-12 LAB — RAD ONC ARIA SESSION SUMMARY
Course Elapsed Days: 7
Plan Fractions Treated to Date: 3
Plan Prescribed Dose Per Fraction: 12 Gy
Plan Total Fractions Prescribed: 5
Plan Total Prescribed Dose: 60 Gy
Reference Point Dosage Given to Date: 36 Gy
Reference Point Session Dosage Given: 12 Gy
Session Number: 3

## 2023-06-14 ENCOUNTER — Other Ambulatory Visit: Payer: Self-pay

## 2023-06-14 ENCOUNTER — Ambulatory Visit
Admission: RE | Admit: 2023-06-14 | Discharge: 2023-06-14 | Disposition: A | Discharge status: Home / Self Care | Source: Ambulatory Visit | Attending: Radiation Oncology | Admitting: Radiation Oncology

## 2023-06-14 DIAGNOSIS — Z510 Encounter for antineoplastic radiation therapy: Secondary | ICD-10-CM | POA: Diagnosis not present

## 2023-06-14 LAB — RAD ONC ARIA SESSION SUMMARY
Course Elapsed Days: 9
Plan Fractions Treated to Date: 4
Plan Prescribed Dose Per Fraction: 12 Gy
Plan Total Fractions Prescribed: 5
Plan Total Prescribed Dose: 60 Gy
Reference Point Dosage Given to Date: 48 Gy
Reference Point Session Dosage Given: 12 Gy
Session Number: 4

## 2023-06-19 ENCOUNTER — Other Ambulatory Visit: Payer: Self-pay

## 2023-06-19 ENCOUNTER — Ambulatory Visit
Admission: RE | Admit: 2023-06-19 | Discharge: 2023-06-19 | Disposition: A | Discharge status: Home / Self Care | Source: Ambulatory Visit | Attending: Radiation Oncology | Admitting: Radiation Oncology

## 2023-06-19 DIAGNOSIS — C3411 Malignant neoplasm of upper lobe, right bronchus or lung: Secondary | ICD-10-CM | POA: Diagnosis not present

## 2023-06-19 DIAGNOSIS — F17210 Nicotine dependence, cigarettes, uncomplicated: Secondary | ICD-10-CM | POA: Diagnosis not present

## 2023-06-19 DIAGNOSIS — Z510 Encounter for antineoplastic radiation therapy: Secondary | ICD-10-CM | POA: Diagnosis not present

## 2023-06-19 LAB — RAD ONC ARIA SESSION SUMMARY
Course Elapsed Days: 14
Plan Fractions Treated to Date: 5
Plan Prescribed Dose Per Fraction: 12 Gy
Plan Total Fractions Prescribed: 5
Plan Total Prescribed Dose: 60 Gy
Reference Point Dosage Given to Date: 60 Gy
Reference Point Session Dosage Given: 12 Gy
Session Number: 5

## 2023-06-21 NOTE — Radiation Completion Notes (Signed)
 Patient Name: Shari Gutierrez, Shari Gutierrez MRN: [id number] Date of Birth: July 27, 1950 Referring Physician: JONATHAN WILLIAMS, M.D. Date of Service: 2023-06-21 Radiation Oncologist: Glenis Langdon, M.D. [facility] [place]                             RADIATION ONCOLOGY END OF TREATMENT NOTE     Diagnosis: R91.1 Solitary pulmonary nodule Intent: Curative     HPI: Patient is a 73 year old female I first consulted 3 months ago for a probable stage I non-small cell lung cancer of the right lung apices.  At that time CT scan showed a 1.6 cm thick-walled atypical pulmonary cyst in the apical segment the right upper lobe.  PET scan showed low hypermetabolic activity in the apical lesion.  She was a nonsurgical candidate according to pulmonology based on the location of tumor.  I ordered 5-month follow-up with repeat CT scan.  Which was read as stable pleural thickening in the right lung apices with an atypical pulmonary cystic lesion.  Again tumor has shown low-level uptake on recent PET scan.  Tumor had increased in size and now has a much more nodular component to the cystic area.  She remains asymptomatic.      ==========DELIVERED PLANS==========  First Treatment Date: 2023-06-05 Last Treatment Date: 2023-06-19   Plan Name: Lung_R_SBRT Site: Lung, Right Technique: SBRT/SRT-IMRT Mode: Photon Dose Per Fraction: 12 Gy Prescribed Dose (Delivered / Prescribed): 60 Gy / 60 Gy Prescribed Fxs (Delivered / Prescribed): 5 / 5     ==========ON TREATMENT VISIT DATES========== 2023-06-05, 2023-06-07, 2023-06-12, 2023-06-12, 2023-06-14, 2023-06-19, 2023-06-19     ==========UPCOMING VISITS==========       ==========APPENDIX - ON TREATMENT VISIT NOTES==========   See weekly On Treatment Notes in Epic for details in the Media tab (listed as Progress notes on the On Treatment Visit Dates listed above).

## 2023-07-19 ENCOUNTER — Ambulatory Visit: Admitting: Radiation Oncology

## 2023-07-30 ENCOUNTER — Ambulatory Visit
Admission: RE | Admit: 2023-07-30 | Discharge: 2023-07-30 | Disposition: A | Discharge status: Home / Self Care | Source: Ambulatory Visit | Attending: Radiation Oncology | Admitting: Radiation Oncology

## 2023-07-30 VITALS — BP 152/76 | HR 79 | Temp 98.6°F | Resp 19 | Wt 103.4 lb

## 2023-07-30 DIAGNOSIS — R911 Solitary pulmonary nodule: Secondary | ICD-10-CM | POA: Insufficient documentation

## 2023-07-30 NOTE — Progress Notes (Signed)
 Radiation Oncology Follow up Note  Name: Shari Gutierrez   Date:   07/30/2023 MRN:  [id number] DOB: Aug 01, 1950    This 73 y.o. female presents to the clinic today for 1 month follow-up status post SBRT to her right lung apices for presumed stage I non-small cell lung cancer and patient with multiple medical comorbidities prohibiting biopsy.  REFERRING PROVIDER: Trudy Dorn BRAVO, MD  HPI: Patient is a 73 year old female now out 1 month having completed SBRT to her right upper lobe for a stage I non-small cell lung cancer favoring adenocarcinoma seen today in routine follow-up she is asymptomatic specifically denies cough,.  She is having no dysphagia cough at this time.  COMPLICATIONS OF TREATMENT: none  FOLLOW UP COMPLIANCE: keeps appointments   PHYSICAL EXAM:  BP (!) 152/76   Pulse 79   Temp 98.6 F (37 C)   Resp 19   Wt 103 lb 6.4 oz (46.9 kg)   SpO2 98%   BMI 18.32 kg/m  Well-developed well-nourished patient in NAD. HEENT reveals PERLA, EOMI, discs not visualized.  Oral cavity is clear. No oral mucosal lesions are identified. Neck is clear without evidence of cervical or supraclavicular adenopathy. Lungs are clear to A&P. Cardiac examination is essentially unremarkable with regular rate and rhythm without murmur rub or thrill. Abdomen is benign with no organomegaly or masses noted. Motor sensory and DTR levels are equal and symmetric in the upper and lower extremities. Cranial nerves II through XII are grossly intact. Proprioception is intact. No peripheral adenopathy or edema is identified. No motor or sensory levels are noted. Crude visual fields are within normal range.  RADIOLOGY RESULTS: CT scan of the chest ordered in 3 months  PLAN: Present time patient is doing well she is asymptomatic tolerated her SBRT with extremely low side effect profile.  I am pleased with her overall progress have ordered a CT scan of the chest in 3 months with a follow-up appointment shortly  thereafter.  Patient knows to call with any concerns.  I would like to take this opportunity to thank you for allowing me to participate in the care of your patient.SABRA Marcey Penton, MD

## 2023-09-11 DIAGNOSIS — H40153 Residual stage of open-angle glaucoma, bilateral: Secondary | ICD-10-CM | POA: Diagnosis not present

## 2023-10-30 ENCOUNTER — Ambulatory Visit
Admission: RE | Admit: 2023-10-30 | Discharge: 2023-10-30 | Disposition: A | Discharge status: Home / Self Care | Source: Ambulatory Visit | Attending: Radiation Oncology | Admitting: Radiation Oncology

## 2023-10-30 DIAGNOSIS — C3490 Malignant neoplasm of unspecified part of unspecified bronchus or lung: Secondary | ICD-10-CM | POA: Diagnosis not present

## 2023-10-30 DIAGNOSIS — J949 Pleural condition, unspecified: Secondary | ICD-10-CM | POA: Diagnosis not present

## 2023-10-30 DIAGNOSIS — R911 Solitary pulmonary nodule: Secondary | ICD-10-CM | POA: Diagnosis not present

## 2023-10-30 DIAGNOSIS — R918 Other nonspecific abnormal finding of lung field: Secondary | ICD-10-CM | POA: Diagnosis not present

## 2023-10-30 MED ORDER — IOHEXOL 300 MG/ML  SOLN
75.0000 mL | Freq: Once | INTRAMUSCULAR | Status: AC | PRN
  Administered 2023-10-30: 75 mL via INTRAVENOUS

## 2023-11-05 ENCOUNTER — Ambulatory Visit
Admission: RE | Admit: 2023-11-05 | Discharge: 2023-11-05 | Disposition: A | Discharge status: Home / Self Care | Source: Ambulatory Visit | Attending: Radiation Oncology | Admitting: Radiation Oncology

## 2023-11-05 ENCOUNTER — Encounter: Payer: Self-pay | Admitting: Radiation Oncology

## 2023-11-05 ENCOUNTER — Other Ambulatory Visit: Payer: Self-pay | Admitting: *Deleted

## 2023-11-05 VITALS — BP 119/77 | HR 89 | Temp 98.0°F | Resp 16 | Ht 64.0 in | Wt 101.7 lb

## 2023-11-05 DIAGNOSIS — Z923 Personal history of irradiation: Secondary | ICD-10-CM | POA: Diagnosis not present

## 2023-11-05 DIAGNOSIS — C3411 Malignant neoplasm of upper lobe, right bronchus or lung: Secondary | ICD-10-CM | POA: Diagnosis not present

## 2023-11-05 DIAGNOSIS — F17210 Nicotine dependence, cigarettes, uncomplicated: Secondary | ICD-10-CM | POA: Diagnosis not present

## 2023-11-05 DIAGNOSIS — R911 Solitary pulmonary nodule: Secondary | ICD-10-CM | POA: Diagnosis not present

## 2023-11-05 NOTE — Progress Notes (Signed)
 Radiation Oncology Follow up Note  Name: Shari Gutierrez   Date:   11/05/2023 MRN:  [id number] DOB: 08/30/1950    This 73 y.o. female presents to the clinic today for 28-month follow-up status post SBRT to her right lung apices for presumed stage I non-small cell lung cancer and patient with multiple medical comorbidities prohibiting biopsy.  REFERRING PROVIDER: Trudy Dorn BRAVO, MD  HPI: Patient is a 73 year old female now out for months having completed SBRT to her right lung apices for presumed stage I non-small cell lung cancer.  Seen today in routine follow-up she is doing well.  She specifically denies breast tenderness cough or bone pain.  She had a CT scan recently for follow-up.  Which I have reviewed.  Again demonstrates irregular right apical mass essentially similar to prior study no new or suspicious lung nodules or evidence of progressive renal disease.  COMPLICATIONS OF TREATMENT: none  FOLLOW UP COMPLIANCE: keeps appointments   PHYSICAL EXAM:  BP 119/77   Pulse 89   Temp 98 F (36.7 C)   Resp 16   Ht 5' 4 (1.626 m)   Wt 101 lb 11.2 oz (46.1 kg)   BMI 17.46 kg/m  Well-developed well-nourished patient in NAD. HEENT reveals PERLA, EOMI, discs not visualized.  Oral cavity is clear. No oral mucosal lesions are identified. Neck is clear without evidence of cervical or supraclavicular adenopathy. Lungs are clear to A&P. Cardiac examination is essentially unremarkable with regular rate and rhythm without murmur rub or thrill. Abdomen is benign with no organomegaly or masses noted. Motor sensory and DTR levels are equal and symmetric in the upper and lower extremities. Cranial nerves II through XII are grossly intact. Proprioception is intact. No peripheral adenopathy or edema is identified. No motor or sensory levels are noted. Crude visual fields are within normal range.  RADIOLOGY RESULTS: Serial CT scans reviewed  PLAN: The present time patient is doing well.  My review  of her CT scan showed is compatible with scar in her area of treatment.  She is asymptomatic.  I am pleased with her overall progress.  I have asked to see her back in 6 months with a repeat CT scan.  Patient knows to call with any concerns.  I would like to take this opportunity to thank you for allowing me to participate in the care of your patient.SABRA Marcey Penton, MD

## 2024-01-03 ENCOUNTER — Ambulatory Visit: Payer: PPO | Admitting: Dermatology

## 2024-03-05 NOTE — Progress Notes (Unsigned)
 "  Referring Physician:  Trudy Dorn BRAVO, MD [phone] [address], [address] [place],  [place] [address]  Primary Physician:  Trudy Dorn BRAVO, MD  History of Present Illness: 03/05/2024 [name] is here today with a chief complaint of ***  Back pain Scoliosis  Duration: *** Location: *** Quality: *** Severity: ***  Precipitating: aggravated by *** Modifying factors: made better by *** Weakness: none Timing: *** Bowel/Bladder Dysfunction: none  Conservative measures:  Physical therapy: ***  Multimodal medical therapy including regular antiinflammatories: ***  Injections: *** epidural steroid injections  Past Surgery: ***  MAEDELL HEDGER has ***no symptoms of cervical myelopathy.  The symptoms are causing a significant impact on the patient's life.   Review of Systems:  A 10 point review of systems is negative, except for the pertinent positives and negatives detailed in the HPI.  Past Medical History: Past Medical History:  Diagnosis Date   Anxiety    COPD (chronic obstructive pulmonary disease) (HCC)    Female dyspareunia    Gall stones    Hematuria    Hyperlipidemia    Hypertension    Reflux    Sinusitis    Vitamin D  deficiency     Past Surgical History: Past Surgical History:  Procedure Laterality Date   APPENDECTOMY  1989   CHOLECYSTECTOMY  02/26/2019   COLONOSCOPY  2016   OOPHORECTOMY  1989    Allergies: Allergies as of 03/10/2024   (No Known Allergies)    Medications: Outpatient Encounter Medications as of 03/10/2024  Medication Sig   albuterol  (VENTOLIN  HFA) 108 (90 Base) MCG/ACT inhaler Inhale 1-2 puffs into the lungs every 6 (six) hours as needed for wheezing or shortness of breath.   ALPRAZolam  (XANAX ) 0.25 MG tablet Take 1 tablet (0.25 mg total) by mouth at bedtime as needed for anxiety.   budesonide -formoterol  (SYMBICORT ) 160-4.5 MCG/ACT inhaler Take  2 puffs by po twice  A day   budesonide -formoterol  (SYMBICORT )  80-4.5 MCG/ACT inhaler *Use as directe (Patient not taking: Reported on 07/30/2023)   cholecalciferol (VITAMIN D ) 1000 UNITS tablet Take 1,000 Units by mouth daily.    conjugated estrogens  (PREMARIN ) vaginal cream Place 0.25 Applicatorfuls vaginally 2 (two) times a week. (Patient not taking: Reported on 07/30/2023)   fenofibrate  160 MG tablet TAKE 1 TABLET BY MOUTH EVERY DAY   latanoprost (XALATAN) 0.005 % ophthalmic solution Place 1 drop into both eyes at bedtime.    meclizine  (ANTIVERT ) 25 MG tablet TAKE 1 TABLET (25 MG TOTAL) BY MOUTH 2 (TWO) TIMES DAILY AS NEEDED FOR DIZZINESS.   metoprolol  tartrate (LOPRESSOR ) 25 MG tablet TAKE 1 TABLET BY MOUTH TWICE A DAY   Multiple Vitamin (MULTIVITAMIN WITH MINERALS) TABS tablet Take 1 tablet by mouth 3 (three) times a week.   Tiotropium Bromide  Monohydrate (SPIRIVA  RESPIMAT) 2.5 MCG/ACT AERS Inhale 2 puffs into the lungs daily.   traMADol  (ULTRAM ) 50 MG tablet Take 1 tablet (50 mg total) by mouth every 8 (eight) hours as needed. (Patient not taking: Reported on 07/30/2023)   triamcinolone  cream (KENALOG ) 0.1 % Apply 1 application topically as directed. Qd to bid up to 5 days a week to itchy rash on back and chest until clear, then prn flares, avoid face, groin, axilla (Patient not taking: Reported on 07/30/2023)   No facility-administered encounter medications on file as of 03/10/2024.    Social History: Social History[1]  Family Medical History: Family History  Problem Relation Age of Onset   Kidney failure Mother  Cancer Father        Gastric   Breast cancer Neg Hx    Ovarian cancer Neg Hx    Colon cancer Neg Hx    Heart disease Neg Hx    Diabetes Neg Hx     Physical Examination: @VITALWITHPAIN @  General: Patient is well developed, well nourished, calm, collected, and in no apparent distress. Attention to examination is appropriate.  Psychiatric: Patient is non-anxious.  Head:  Pupils equal, round, and reactive to light.  ENT:  Oral  mucosa appears well hydrated.  Neck:   Supple.  ***Full range of motion.  Respiratory: Patient is breathing without any difficulty.  Extremities: No edema.  Vascular: Palpable dorsal pedal pulses.  Skin:   On exposed skin, there are no abnormal skin lesions.  NEUROLOGICAL:     Awake, alert, oriented to person, place, and time.  Speech is clear and fluent. Fund of knowledge is appropriate.   Cranial Nerves: Pupils equal round and reactive to light.  Facial tone is symmetric.  Facial sensation is symmetric.  ROM of spine: ***full.  Palpation of spine: ***non tender.    Strength: Side Biceps Triceps Deltoid Interossei Grip Wrist Ext. Wrist Flex.  R 5 5 5 5 5 5 5   L 5 5 5 5 5 5 5    Side Iliopsoas Quads Hamstring PF DF EHL  R 5 5 5 5 5 5   L 5 5 5 5 5 5    Reflexes are ***2+ and symmetric at the biceps, triceps, brachioradialis, patella and achilles.   Hoffman's is absent.  Clonus is not present.  Toes are down-going.  Bilateral upper and lower extremity sensation is intact to light touch.    Gait is normal.   No difficulty with tandem gait.   No evidence of dysmetria noted.  Medical Decision Making  Imaging: ***  I have personally reviewed the images and agree with the above interpretation.  Assessment and Plan: Ms. Gains is a pleasant 74 y.o. female with ***    Thank you for involving me in the care of this patient.   I spent a total of *** minutes in both face-to-face and non-face-to-face activities for this visit on the date of this encounter.   Lyle Decamp, PA-C Dept. of Neurosurgery     [1]  Social History Tobacco Use   Smoking status: Every Day    Current packs/day: 1.00    Average packs/day: 1 pack/day for 44.6 years (44.6 ttl pk-yrs)    Types: Cigarettes    Start date: 07/28/1979   Smokeless tobacco: Never   Tobacco comments:    pt is using a patch to help quit  Vaping Use   Vaping status: Never Used  Substance Use Topics   Alcohol use: Yes     Alcohol/week: 0.0 standard drinks of alcohol    Comment: rarely   Drug use: No   "

## 2024-03-10 ENCOUNTER — Ambulatory Visit: Admitting: Physician Assistant

## 2024-03-11 ENCOUNTER — Ambulatory Visit: Admitting: Dermatology

## 2024-04-08 ENCOUNTER — Ambulatory Visit: Admitting: Orthopedic Surgery

## 2024-04-21 ENCOUNTER — Other Ambulatory Visit

## 2024-05-05 ENCOUNTER — Ambulatory Visit: Admitting: Radiation Oncology
